# Patient Record
Sex: Female | Born: 1945
Health system: Southern US, Community
[De-identification: ages and names within clinical notes are randomized; demographics above are authoritative.]

## PROBLEM LIST (undated history)

## (undated) DIAGNOSIS — I10 Essential (primary) hypertension: Secondary | ICD-10-CM

## (undated) DIAGNOSIS — I639 Cerebral infarction, unspecified: Secondary | ICD-10-CM

## (undated) DIAGNOSIS — I4891 Unspecified atrial fibrillation: Secondary | ICD-10-CM

## (undated) HISTORY — DX: Cerebral infarction, unspecified: I63.9

---

## 1998-08-01 ENCOUNTER — Emergency Department (HOSPITAL_COMMUNITY): Admission: EM | Admit: 1998-08-01 | Discharge: 1998-08-01 | Payer: Self-pay | Admitting: Emergency Medicine

## 1998-08-16 ENCOUNTER — Encounter: Admission: RE | Admit: 1998-08-16 | Discharge: 1998-08-16 | Payer: Self-pay | Admitting: *Deleted

## 1998-08-30 ENCOUNTER — Encounter: Admission: RE | Admit: 1998-08-30 | Discharge: 1998-08-30 | Payer: Self-pay | Admitting: Family Medicine

## 1998-12-02 ENCOUNTER — Encounter: Admission: RE | Admit: 1998-12-02 | Discharge: 1998-12-02 | Payer: Self-pay | Admitting: Family Medicine

## 1998-12-05 ENCOUNTER — Encounter: Admission: RE | Admit: 1998-12-05 | Discharge: 1998-12-05 | Payer: Self-pay | Admitting: Family Medicine

## 1999-03-07 ENCOUNTER — Encounter: Admission: RE | Admit: 1999-03-07 | Discharge: 1999-03-07 | Payer: Self-pay | Admitting: Family Medicine

## 1999-09-22 ENCOUNTER — Encounter: Admission: RE | Admit: 1999-09-22 | Discharge: 1999-09-22 | Payer: Self-pay | Admitting: Family Medicine

## 2000-01-25 ENCOUNTER — Emergency Department (HOSPITAL_COMMUNITY): Admission: EM | Admit: 2000-01-25 | Discharge: 2000-01-25 | Payer: Self-pay | Admitting: Emergency Medicine

## 2000-01-25 ENCOUNTER — Encounter: Payer: Self-pay | Admitting: Emergency Medicine

## 2000-01-30 ENCOUNTER — Encounter: Admission: RE | Admit: 2000-01-30 | Discharge: 2000-01-30 | Payer: Self-pay | Admitting: Family Medicine

## 2000-02-04 ENCOUNTER — Encounter: Admission: RE | Admit: 2000-02-04 | Discharge: 2000-02-04 | Payer: Self-pay | Admitting: Family Medicine

## 2000-03-03 ENCOUNTER — Encounter: Admission: RE | Admit: 2000-03-03 | Discharge: 2000-03-03 | Payer: Self-pay | Admitting: Family Medicine

## 2000-03-10 ENCOUNTER — Encounter: Payer: Self-pay | Admitting: *Deleted

## 2000-03-10 ENCOUNTER — Encounter: Admission: RE | Admit: 2000-03-10 | Discharge: 2000-03-10 | Payer: Self-pay | Admitting: *Deleted

## 2000-03-18 ENCOUNTER — Encounter: Payer: Self-pay | Admitting: *Deleted

## 2000-03-18 ENCOUNTER — Encounter: Admission: RE | Admit: 2000-03-18 | Discharge: 2000-03-18 | Payer: Self-pay | Admitting: *Deleted

## 2001-08-28 ENCOUNTER — Emergency Department (HOSPITAL_COMMUNITY): Admission: EM | Admit: 2001-08-28 | Discharge: 2001-08-28 | Payer: Self-pay | Admitting: Emergency Medicine

## 2001-08-29 ENCOUNTER — Emergency Department (HOSPITAL_COMMUNITY): Admission: EM | Admit: 2001-08-29 | Discharge: 2001-08-29 | Payer: Self-pay | Admitting: *Deleted

## 2001-08-31 ENCOUNTER — Encounter: Admission: RE | Admit: 2001-08-31 | Discharge: 2001-08-31 | Payer: Self-pay | Admitting: Family Medicine

## 2001-08-31 ENCOUNTER — Ambulatory Visit (HOSPITAL_COMMUNITY): Admission: RE | Admit: 2001-08-31 | Discharge: 2001-08-31 | Payer: Self-pay | Admitting: Family Medicine

## 2002-04-03 ENCOUNTER — Encounter: Admission: RE | Admit: 2002-04-03 | Discharge: 2002-04-03 | Payer: Self-pay | Admitting: Family Medicine

## 2002-05-02 ENCOUNTER — Encounter: Admission: RE | Admit: 2002-05-02 | Discharge: 2002-05-02 | Payer: Self-pay | Admitting: Family Medicine

## 2002-05-18 ENCOUNTER — Encounter: Admission: RE | Admit: 2002-05-18 | Discharge: 2002-05-18 | Payer: Self-pay | Admitting: Family Medicine

## 2002-07-12 ENCOUNTER — Encounter: Admission: RE | Admit: 2002-07-12 | Discharge: 2002-07-12 | Payer: Self-pay | Admitting: Family Medicine

## 2002-08-18 ENCOUNTER — Encounter: Admission: RE | Admit: 2002-08-18 | Discharge: 2002-08-18 | Payer: Self-pay | Admitting: Family Medicine

## 2002-09-08 ENCOUNTER — Encounter: Admission: RE | Admit: 2002-09-08 | Discharge: 2002-09-08 | Payer: Self-pay | Admitting: Family Medicine

## 2002-09-22 ENCOUNTER — Encounter: Admission: RE | Admit: 2002-09-22 | Discharge: 2002-09-22 | Payer: Self-pay | Admitting: Sports Medicine

## 2002-10-11 ENCOUNTER — Encounter: Payer: Self-pay | Admitting: Sports Medicine

## 2002-10-11 ENCOUNTER — Encounter: Admission: RE | Admit: 2002-10-11 | Discharge: 2002-10-11 | Payer: Self-pay | Admitting: Sports Medicine

## 2002-10-17 ENCOUNTER — Encounter: Admission: RE | Admit: 2002-10-17 | Discharge: 2002-10-17 | Payer: Self-pay | Admitting: Family Medicine

## 2002-11-16 ENCOUNTER — Encounter: Admission: RE | Admit: 2002-11-16 | Discharge: 2002-11-16 | Payer: Self-pay | Admitting: Sports Medicine

## 2002-12-14 ENCOUNTER — Encounter: Admission: RE | Admit: 2002-12-14 | Discharge: 2002-12-14 | Payer: Self-pay | Admitting: Family Medicine

## 2003-01-10 ENCOUNTER — Encounter: Admission: RE | Admit: 2003-01-10 | Discharge: 2003-01-10 | Payer: Self-pay | Admitting: Family Medicine

## 2003-02-15 ENCOUNTER — Encounter: Admission: RE | Admit: 2003-02-15 | Discharge: 2003-02-15 | Payer: Self-pay | Admitting: Sports Medicine

## 2003-03-20 ENCOUNTER — Encounter: Admission: RE | Admit: 2003-03-20 | Discharge: 2003-03-20 | Payer: Self-pay | Admitting: Family Medicine

## 2003-04-13 ENCOUNTER — Ambulatory Visit (HOSPITAL_COMMUNITY): Admission: RE | Admit: 2003-04-13 | Discharge: 2003-04-13 | Payer: Self-pay | Admitting: *Deleted

## 2003-04-27 ENCOUNTER — Encounter: Admission: RE | Admit: 2003-04-27 | Discharge: 2003-04-27 | Payer: Self-pay | Admitting: Family Medicine

## 2003-04-27 ENCOUNTER — Encounter (INDEPENDENT_AMBULATORY_CARE_PROVIDER_SITE_OTHER): Payer: Self-pay | Admitting: *Deleted

## 2003-05-18 ENCOUNTER — Encounter: Admission: RE | Admit: 2003-05-18 | Discharge: 2003-05-18 | Payer: Self-pay | Admitting: Family Medicine

## 2003-09-04 ENCOUNTER — Encounter: Admission: RE | Admit: 2003-09-04 | Discharge: 2003-09-04 | Payer: Self-pay | Admitting: Family Medicine

## 2003-09-04 ENCOUNTER — Encounter (INDEPENDENT_AMBULATORY_CARE_PROVIDER_SITE_OTHER): Payer: Self-pay | Admitting: *Deleted

## 2003-09-19 ENCOUNTER — Ambulatory Visit: Payer: Self-pay | Admitting: Family Medicine

## 2003-09-26 ENCOUNTER — Encounter: Admission: RE | Admit: 2003-09-26 | Discharge: 2003-09-26 | Payer: Self-pay | Admitting: Sports Medicine

## 2003-09-26 ENCOUNTER — Ambulatory Visit: Payer: Self-pay | Admitting: Family Medicine

## 2003-12-05 ENCOUNTER — Ambulatory Visit: Payer: Self-pay | Admitting: Family Medicine

## 2004-02-21 ENCOUNTER — Emergency Department (HOSPITAL_COMMUNITY): Admission: EM | Admit: 2004-02-21 | Discharge: 2004-02-22 | Payer: Self-pay | Admitting: Emergency Medicine

## 2004-03-18 ENCOUNTER — Ambulatory Visit: Payer: Self-pay | Admitting: Family Medicine

## 2004-03-21 ENCOUNTER — Ambulatory Visit: Payer: Self-pay | Admitting: Family Medicine

## 2004-04-03 ENCOUNTER — Encounter: Admission: RE | Admit: 2004-04-03 | Discharge: 2004-04-03 | Payer: Self-pay | Admitting: Sports Medicine

## 2004-04-04 ENCOUNTER — Ambulatory Visit: Payer: Self-pay | Admitting: Family Medicine

## 2004-07-11 ENCOUNTER — Ambulatory Visit: Payer: Self-pay | Admitting: Family Medicine

## 2004-07-25 ENCOUNTER — Encounter (INDEPENDENT_AMBULATORY_CARE_PROVIDER_SITE_OTHER): Payer: Self-pay | Admitting: Specialist

## 2004-07-25 ENCOUNTER — Inpatient Hospital Stay (HOSPITAL_COMMUNITY): Admission: EM | Admit: 2004-07-25 | Discharge: 2004-07-26 | Payer: Self-pay | Admitting: Emergency Medicine

## 2004-07-31 ENCOUNTER — Ambulatory Visit: Payer: Self-pay | Admitting: Family Medicine

## 2004-08-26 ENCOUNTER — Emergency Department (HOSPITAL_COMMUNITY): Admission: EM | Admit: 2004-08-26 | Discharge: 2004-08-26 | Payer: Self-pay | Admitting: Emergency Medicine

## 2005-01-20 ENCOUNTER — Ambulatory Visit: Payer: Self-pay | Admitting: Family Medicine

## 2005-01-21 ENCOUNTER — Ambulatory Visit: Payer: Self-pay | Admitting: Family Medicine

## 2005-02-24 ENCOUNTER — Ambulatory Visit: Payer: Self-pay | Admitting: Sports Medicine

## 2005-02-24 ENCOUNTER — Encounter (INDEPENDENT_AMBULATORY_CARE_PROVIDER_SITE_OTHER): Payer: Self-pay | Admitting: *Deleted

## 2005-05-01 ENCOUNTER — Ambulatory Visit: Payer: Self-pay | Admitting: Family Medicine

## 2005-05-05 ENCOUNTER — Ambulatory Visit (HOSPITAL_COMMUNITY): Admission: RE | Admit: 2005-05-05 | Discharge: 2005-05-05 | Payer: Self-pay | Admitting: Family Medicine

## 2005-05-08 ENCOUNTER — Encounter: Admission: RE | Admit: 2005-05-08 | Discharge: 2005-05-08 | Payer: Self-pay | Admitting: Sports Medicine

## 2005-06-09 ENCOUNTER — Ambulatory Visit: Payer: Self-pay | Admitting: Family Medicine

## 2005-06-23 ENCOUNTER — Ambulatory Visit: Payer: Self-pay | Admitting: Sports Medicine

## 2005-10-30 ENCOUNTER — Ambulatory Visit: Payer: Self-pay | Admitting: Family Medicine

## 2005-11-09 ENCOUNTER — Ambulatory Visit: Payer: Self-pay | Admitting: Family Medicine

## 2005-12-05 ENCOUNTER — Encounter (INDEPENDENT_AMBULATORY_CARE_PROVIDER_SITE_OTHER): Payer: Self-pay | Admitting: *Deleted

## 2005-12-05 LAB — CONVERTED CEMR LAB

## 2005-12-10 ENCOUNTER — Encounter: Admission: RE | Admit: 2005-12-10 | Discharge: 2005-12-10 | Payer: Self-pay | Admitting: Sports Medicine

## 2005-12-10 ENCOUNTER — Ambulatory Visit: Payer: Self-pay | Admitting: Sports Medicine

## 2005-12-16 ENCOUNTER — Ambulatory Visit: Payer: Self-pay | Admitting: Sports Medicine

## 2005-12-22 ENCOUNTER — Ambulatory Visit: Payer: Self-pay

## 2005-12-22 ENCOUNTER — Encounter (INDEPENDENT_AMBULATORY_CARE_PROVIDER_SITE_OTHER): Payer: Self-pay | Admitting: *Deleted

## 2006-01-08 ENCOUNTER — Ambulatory Visit: Payer: Self-pay

## 2006-02-01 ENCOUNTER — Emergency Department (HOSPITAL_COMMUNITY): Admission: EM | Admit: 2006-02-01 | Discharge: 2006-02-01 | Payer: Self-pay | Admitting: Family Medicine

## 2006-02-16 ENCOUNTER — Ambulatory Visit: Payer: Self-pay | Admitting: Family Medicine

## 2006-03-04 DIAGNOSIS — E782 Mixed hyperlipidemia: Secondary | ICD-10-CM

## 2006-03-04 DIAGNOSIS — R8789 Other abnormal findings in specimens from female genital organs: Secondary | ICD-10-CM

## 2006-03-04 DIAGNOSIS — I10 Essential (primary) hypertension: Secondary | ICD-10-CM | POA: Insufficient documentation

## 2006-03-05 ENCOUNTER — Encounter (INDEPENDENT_AMBULATORY_CARE_PROVIDER_SITE_OTHER): Payer: Self-pay | Admitting: *Deleted

## 2006-05-21 ENCOUNTER — Telehealth: Payer: Self-pay | Admitting: *Deleted

## 2006-09-07 ENCOUNTER — Emergency Department (HOSPITAL_COMMUNITY): Admission: EM | Admit: 2006-09-07 | Discharge: 2006-09-07 | Payer: Self-pay | Admitting: *Deleted

## 2007-12-06 ENCOUNTER — Inpatient Hospital Stay (HOSPITAL_COMMUNITY): Admission: EM | Admit: 2007-12-06 | Discharge: 2007-12-08 | Payer: Self-pay | Admitting: Emergency Medicine

## 2010-01-13 ENCOUNTER — Emergency Department (HOSPITAL_COMMUNITY)
Admission: EM | Admit: 2010-01-13 | Discharge: 2010-01-13 | Payer: Self-pay | Source: Home / Self Care | Admitting: Emergency Medicine
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
## 2010-05-20 NOTE — Discharge Summary (Signed)
Sherry Fitzgerald, Sherry Fitzgerald                  ACCOUNT NO.:  [id number]   MEDICAL RECORD NO.:  [id number]          PATIENT TYPE:  INP   LOCATION:  1338                         FACILITY:  [facility]   PHYSICIAN:  Monte Fantasia, MD  DATE OF BIRTH:  1945/06/02   DATE OF ADMISSION:  12/06/2007  DATE OF DISCHARGE:                               DISCHARGE SUMMARY   DISCHARGE DIAGNOSES:  1. Gastroenteritis, possibly viral/bacterial.  2. Hypertension, blood pressure uncontrolled on admission.  3. Elevated cholesterol.   DISCHARGE MEDICATIONS:  1. Ciprofloxacin 500 mg p.o. q.12 hours for five days.  2. Hydrochlorothiazide 25 mg p.o. daily.  3. Norvasc 5 mg p.o. daily.  4. Protonix 40 mg p.o. q.12 hours.   COURSE DURING THE HOSPITAL STAY:  Sherry Fitzgerald is a 65 year old lady  patient of Oriental descent, was admitted on December 1 with complaints  of nausea, vomiting and diarrhea.  She had come in with complaints of  right upper quadrant pain when she came in on Sunday.  She received  Reglan and Zofran in the ER with IV fluid hydration.  CT abdomen and  pelvis done, revealed no evidence of any acute pancreatitis or surgical  abdomen.  Patient was kept n.p.o. and was then gradually increased to  clear liquids to soft then full diet which she tolerated very well.  Patient episodes of diarrhea resolved gradually and at present the  patient has 2 - 3 episodes of well formed stools and patient also during  the hospital stay liver functions were found to be mildly elevated, now  at present are on a downward trend.  Patient was started on  ciprofloxacin for her possible gastroenteritis plus for possible  bacterial origin of her gastroenteritis.  We will treat her for  ciprofloxacin for 5 days and patient symptomatically feeling much better  and can be discharged, if stable enough will be discharged home.   Radiological investigations during the hospital stay:  1. CT abdomen and pelvis done on December 06, 2007.   Impression:  Prior      cholecystectomy, dilated common bile duct, likely related to the      post cholecystectomy state.  2. Ultrasound of the abdomen done on December 07, 2007.  Impression:      Prior cholecystectomy, dilated common bile duct related to the post      cholecystectomy state.   LABS ON DISCHARGE:  Total WBCs 2.9, hemoglobin 11.7, hematocrit 36.9,  platelet count 196, sodium 140, potassium 3.9, chloride 112, bicarb 23,  glucose 112, BUN 2, creatinine 0.6, total bilirubin 8.8, alkaline  phosphatase 132 which is decreased from 163, AST is 86 decreased from  316, ALT is 180 decreased from 276, total protein 6, albumin 3.1,  calcium 8.4, magnesium 1.9.   PLAN:  To discharge patient home.  Will start on ciprofloxacin oral for  5 days.  Also patient is started on Norvasc 5 mg for better blood  pressure control, hydrochlorothiazide continued at 25 mg daily.  Protonix to be continued for the next 2 weeks.  Patient advised to  follow up with the  primary care physician in the next one week.   PRIMARY CARE PHYSICIAN:  Unassigned.      Monte Fantasia, MD  Electronically Signed     MP/MEDQ  D:  12/08/2007  T:  12/08/2007  Job:  [id number]

## 2010-05-23 NOTE — H&P (Signed)
NAMECIEANNA, STORMES NO.:  [id number]   MEDICAL RECORD NO.:  [id number]          PATIENT TYPE:  EMS   LOCATION:  MAJO                         FACILITY:  [facility]   PHYSICIAN:  Paula Libra, MD        DATE OF BIRTH:  Feb 15, 1945   DATE OF ADMISSION:  07/25/2004  DATE OF DISCHARGE:                                HISTORY & PHYSICAL   CHIEF COMPLAINT:  Right upper quadrant pain.   Ms. Herold Harms is a 65 year old female who awoke this morning with right upper  quadrant pain radiating into her scapular region.  This was accompanied by  fever and chills.  She denied any chest pain, shortness of breath and  otherwise her review of systems are negative.  An ultrasound at [facility] showed gallbladder thickening with pericholecystic fluid as well as  sludge.  This study is consistent with acute cholecystitis.   ALLERGIES:  No known drug allergies.   MEDICATIONS:  None.   PAST MEDICAL HISTORY:  1.  Hypertension.  2.  Hypercholesterolemia.   PAST SURGICAL HISTORY:  Cesarean section in the 1990s.   SOCIAL HISTORY:  No tobacco, alcohol, or illicit drug use.  She lives in  [place].   FAMILY HISTORY:  Mom alive age 35, hypertension.  Dad deceased age 75, MI.   PHYSICAL EXAMINATION:  VITAL SIGNS:  Temperature 103, pulse 92, respirations  20, blood pressure 138/89.  GENERAL:  She appears ill.  Complains of right upper quadrant and is  extremely hot to touch.  HEENT:  Grossly normal.  No carotid or subclavian bruits.  No JVD or  thyromegaly.  Sclerae clear.  Conjunctivae normal.  Nares without discharge.  HEART:  Regular rate and rhythm with a soft 1/6 murmur.  No diastolic  component.  CHEST:  Clear to auscultation bilaterally.  ADENOPATHY:  None.  ABDOMEN:  Right upper quadrant tenderness.  Positive Murphy's sign.  EXTREMITIES:  No peripheral edema.  SKIN:  Hot, dry to touch.   LABORATORIES:  White count 11,000.  AST 198, ALT 92, total bilirubin 0.4.  Lipase is  normal.  Urinalysis is negative.   ASSESSMENT/PLAN:  1.  Acute cholecystitis.  She will be taken to the operating room emergently      by Dr. Wenda Low.  2.  Hypertension.  3.  Hypercholesterolemia.  4.  History of cesarean section in the 1990s.  The patient was seen and      examined by Dr. Wenda Low.        LB/MEDQ  D:  07/25/2004  T:  07/25/2004  Job:  [id number]   cc:   [facility]   Thornton Park. Daphine Deutscher, MD  [address], [address]  [place]  [place] [address]

## 2010-05-23 NOTE — Op Note (Signed)
Sherry Fitzgerald, Sherry Fitzgerald                  ACCOUNT NO.:  [id number]   MEDICAL RECORD NO.:  [id number]          PATIENT TYPE:  INP   LOCATION:  5735                         FACILITY:  [facility]   PHYSICIAN:  Thornton Park. Daphine Deutscher, MD  DATE OF BIRTH:  04/11/1945   DATE OF PROCEDURE:  07/25/2004  DATE OF DISCHARGE:                                 OPERATIVE REPORT   PREOPERATIVE DIAGNOSIS:  Acute cholecystitis.   POSTOPERATIVE DIAGNOSIS:  Acute cholecystitis.   PROCEDURE:  Laparoscopic cholecystectomy with intraoperative cholangiogram.   SURGEON:  Thornton Park. Daphine Deutscher, MD   ANESTHESIA:  General endotracheal.   ASSISTANT:  Magnus Ivan, RN   DESCRIPTION OF PROCEDURE:  The patient was taken to room 17 on Friday, July 25, 2004 and given general anesthesia. Abdomen was prepped widely with  Betadine and draped sterilely. A longitudinal incision was made down the  umbilicus through which the Hasson cannula was inserted without difficulty.  The abdomen was insufflated.  The gallbladder was grasped and elevated, the  adhesions were pulled away. Dissected free of Calot's triangle and found a  fairly short cystic duct.  I put a clip upon the gallbladder, incised the  cystic duct, it had a lot of mucus in it. I inserted a Reddick catheter,  took a dynamic cholangiogram which showed a rather plump the common bile  duct with distal tapering but no obvious stones. Intrahepatic filling was  noted as well. The contrast flowed freely into the duodenum. Cystic duct was  then triple clipped, divided.  Cystic artery was triple clipped divided and  the gallbladder was removed from the gallbladder bed without entering it.  One little spot at the very top was bleeding and was cauterized  electrocautery. The gallbladder was placed in the bag brought out through  the umbilicus. The umbilical port was repaired with figure-of-eight suture 0  Vicryl under laparoscopic vision. The abdomen was reinspected. No bleeding  or bile  leaks were noted. Once deflated, the wounds were closed 4-0 Vicryl,  Benzoin Steri-Strips. The patient tolerated procedure well and was taken to  recovery room in satisfactory condition.       MBM/MEDQ  D:  07/25/2004  T:  07/26/2004  Job:  [id number]

## 2010-10-10 LAB — DIFFERENTIAL
Basophils Absolute: 0 10*3/uL (ref 0.0–0.1)
Basophils Relative: 0 % (ref 0–1)
Eosinophils Relative: 1 % (ref 0–5)
Lymphocytes Relative: 6 % — ABNORMAL LOW (ref 12–46)
Monocytes Absolute: 0.4 10*3/uL (ref 0.1–1.0)
Monocytes Relative: 4 % (ref 3–12)

## 2010-10-10 LAB — COMPREHENSIVE METABOLIC PANEL
ALT: 180 U/L — ABNORMAL HIGH (ref 0–35)
ALT: 276 U/L — ABNORMAL HIGH (ref 0–35)
AST: 114 U/L — ABNORMAL HIGH (ref 0–37)
AST: 316 U/L — ABNORMAL HIGH (ref 0–37)
AST: 86 U/L — ABNORMAL HIGH (ref 0–37)
Albumin: 2.9 g/dL — ABNORMAL LOW (ref 3.5–5.2)
Albumin: 3.4 g/dL — ABNORMAL LOW (ref 3.5–5.2)
Alkaline Phosphatase: 132 U/L — ABNORMAL HIGH (ref 39–117)
Alkaline Phosphatase: 163 U/L — ABNORMAL HIGH (ref 39–117)
Alkaline Phosphatase: 82 U/L (ref 39–117)
BUN: 7 mg/dL (ref 6–23)
CO2: 23 mEq/L (ref 19–32)
CO2: 24 mEq/L (ref 19–32)
Calcium: 8 mg/dL — ABNORMAL LOW (ref 8.4–10.5)
Chloride: 106 mEq/L (ref 96–112)
Chloride: 107 mEq/L (ref 96–112)
Chloride: 112 mEq/L (ref 96–112)
Creatinine, Ser: 0.67 mg/dL (ref 0.4–1.2)
GFR calc Af Amer: >60 mL/min (ref >60)
GFR calc Af Amer: >60 mL/min (ref >60)
GFR calc Af Amer: >60 mL/min (ref >60)
GFR calc non Af Amer: >60 mL/min (ref >60)
GFR calc non Af Amer: >60 mL/min (ref >60)
Glucose, Bld: 112 mg/dL — ABNORMAL HIGH (ref 70–99)
Glucose, Bld: 93 mg/dL (ref 70–99)
Potassium: 3.3 mEq/L — ABNORMAL LOW (ref 3.5–5.1)
Potassium: 4.6 mEq/L (ref 3.5–5.1)
Sodium: 137 mEq/L (ref 135–145)
Sodium: 140 mEq/L (ref 135–145)
Total Bilirubin: 0.8 mg/dL (ref 0.3–1.2)
Total Bilirubin: 0.9 mg/dL (ref 0.3–1.2)
Total Bilirubin: 1.2 mg/dL (ref 0.3–1.2)
Total Protein: 5.9 g/dL — ABNORMAL LOW (ref 6.0–8.3)
Total Protein: 6.6 g/dL (ref 6.0–8.3)

## 2010-10-10 LAB — CBC
Hemoglobin: 11.6 g/dL — ABNORMAL LOW (ref 12.0–15.0)
Hemoglobin: 11.7 g/dL — ABNORMAL LOW (ref 12.0–15.0)
MCHC: 32.2 g/dL (ref 30.0–36.0)
MCV: 71.7 fL — ABNORMAL LOW (ref 78.0–100.0)
Platelets: 218 10*3/uL (ref 150–400)
RBC: 5 MIL/uL (ref 3.87–5.11)
RBC: 5.14 MIL/uL — ABNORMAL HIGH (ref 3.87–5.11)
RDW: 13.7 % (ref 11.5–15.5)
RDW: 14.3 % (ref 11.5–15.5)
WBC: 10.5 10*3/uL (ref 4.0–10.5)
WBC: 2.9 10*3/uL — ABNORMAL LOW (ref 4.0–10.5)

## 2010-10-10 LAB — MAGNESIUM: Magnesium: 1.9 mg/dL (ref 1.5–2.5)

## 2010-10-10 LAB — LIPID PANEL
Cholesterol: 218 mg/dL — ABNORMAL HIGH (ref 0–200)
HDL: 47 mg/dL (ref >39)
LDL Cholesterol: 161 mg/dL — ABNORMAL HIGH (ref 0–99)
Total CHOL/HDL Ratio: 4.6 RATIO
Triglycerides: 50 mg/dL (ref <150)
VLDL: 10 mg/dL (ref 0–40)

## 2010-10-10 LAB — HEPATITIS A ANTIBODY, IGM: Hep A IgM: NEGATIVE

## 2010-10-10 LAB — LIPASE, BLOOD: Lipase: 125 U/L — ABNORMAL HIGH (ref 11–59)

## 2010-10-10 LAB — HEPATITIS C ANTIBODY: HCV Ab: NEGATIVE

## 2010-10-10 LAB — HEPATITIS B CORE ANTIBODY, IGM: Hep B C IgM: NEGATIVE

## 2010-10-17 LAB — I-STAT 8, (EC8 V) (CONVERTED LAB)
Acid-base deficit: 2
HCT: 54 — ABNORMAL HIGH
Operator id: 272551
Potassium: 7.6
TCO2: 28
pH, Ven: 7.291

## 2010-10-17 LAB — CBC
Hemoglobin: 14.8
MCHC: 32.5
Platelets: 377
RDW: 13.9

## 2010-10-17 LAB — DIFFERENTIAL
Basophils Absolute: 0
Basophils Relative: 0
Monocytes Absolute: 0.7
Neutro Abs: 16.7 — ABNORMAL HIGH
Neutrophils Relative %: 90 — ABNORMAL HIGH

## 2010-10-17 LAB — POTASSIUM: Potassium: 4.5

## 2010-10-27 ENCOUNTER — Encounter: Payer: Self-pay | Admitting: Family Medicine

## 2010-10-27 ENCOUNTER — Ambulatory Visit (INDEPENDENT_AMBULATORY_CARE_PROVIDER_SITE_OTHER): Payer: Medicaid Other | Admitting: Family Medicine

## 2010-10-27 DIAGNOSIS — E785 Hyperlipidemia, unspecified: Secondary | ICD-10-CM

## 2010-10-27 DIAGNOSIS — M79605 Pain in left leg: Secondary | ICD-10-CM

## 2010-10-27 DIAGNOSIS — M79609 Pain in unspecified limb: Secondary | ICD-10-CM

## 2010-10-27 DIAGNOSIS — M5416 Radiculopathy, lumbar region: Secondary | ICD-10-CM | POA: Insufficient documentation

## 2010-10-27 DIAGNOSIS — I10 Essential (primary) hypertension: Secondary | ICD-10-CM

## 2010-10-27 DIAGNOSIS — M722 Plantar fascial fibromatosis: Secondary | ICD-10-CM

## 2010-10-27 LAB — COMPREHENSIVE METABOLIC PANEL
ALT: 23 U/L (ref 0–35)
CO2: 26 mEq/L (ref 19–32)
Calcium: 9.3 mg/dL (ref 8.4–10.5)
Chloride: 101 mEq/L (ref 96–112)
Creat: 1.22 mg/dL — ABNORMAL HIGH (ref 0.50–1.10)
Sodium: 141 mEq/L (ref 135–145)
Total Protein: 7.6 g/dL (ref 6.0–8.3)

## 2010-10-27 MED ORDER — HYDROCHLOROTHIAZIDE 12.5 MG PO TABS: 12.5000 mg | ORAL_TABLET | Freq: Every day | ORAL | Status: DC

## 2010-10-27 MED ORDER — GABAPENTIN 300 MG PO CAPS: 300.0000 mg | ORAL_CAPSULE | Freq: Every day | ORAL | Status: DC

## 2010-10-28 LAB — CBC
Platelets: 269 10*3/uL (ref 150–400)
RDW: 14.4 % (ref 11.5–15.5)
WBC: 7.2 10*3/uL (ref 4.0–10.5)

## 2010-11-02 NOTE — Assessment & Plan Note (Signed)
BP elevated today.  I think she needs to get back on a medication for this.  Discussed options and will start HCTZ, return in two weeks to recheck.

## 2010-11-02 NOTE — Progress Notes (Signed)
  Subjective:    Patient ID: Sherry Fitzgerald, female    DOB: 1945-04-15, 65 y.o.   MRN: [id number]  HPI 1. HTN:  Has history of HTN and was on medication previously.  She is unsure what she was on before but does recall it was only a single medicine.  She has not taken medication in >2 years.  Has not had lab work for quite sometime either.  She denies any side effects from previous meds.  She denies any headaches, nausea, vision change, chest pain, shortness of breath.   2. Pain in leg:  Has pain in legs L>R. Describes pain as aching and tingling with occasional numbness down the legs.  No weakness or foot drop noticed.  Noticed a few weeks ago, no recent injury.  Denies low back pain.  Denies problems with bowel or bladder and no inner thigh or perineal numbness.   3. Pain in foot:  Pain in R foot for a couple weeks.  Pain is described as burning and located along the plantar aspect of the foot.  Pain is worse when first getting up and improves as the day goes on.  Has not tried anything to alleviated the pain.  No swelling or redness in the foot.   4. HLD:  History of HLD, currently on no medication for this.  Unsure if she was on medication for this in the past.  Eats a low fat mostly vegetable and grain diet.  She denies history of diabetes.    Review of Systems     Objective:   Physical Exam  Constitutional: She appears well-developed and well-nourished. No distress.  Neck: Neck supple. No thyromegaly present.  Cardiovascular: Normal rate, regular rhythm and normal heart sounds.  Exam reveals no gallop and no friction rub.   No murmur heard. Pulmonary/Chest: Effort normal and breath sounds normal. No respiratory distress. She has no wheezes.  Musculoskeletal:       Lower back without TTP.  SLR + on L for tingling down L leg, Negative on R.  R foot is tender to palpation on calcaneal tuberosity along insertion of plantar fascia.            Assessment & Plan:

## 2010-11-02 NOTE — Assessment & Plan Note (Signed)
History and exam consistent with plantar fasciitis of R foot.  Given exercises and stretches to try.  Instructed to ice in ice water daily.  F/u in 6 weeks to see if improving.

## 2010-11-02 NOTE — Assessment & Plan Note (Signed)
Left leg pain consistent with radicular or neuropathic pain.  Will give her a trial of neurontin, will start at night for now to minimize side effects and adjust as needed to provide relief of symptoms.

## 2010-11-02 NOTE — Assessment & Plan Note (Signed)
Check D-LDL today 

## 2010-11-12 ENCOUNTER — Encounter: Payer: Self-pay | Admitting: Family Medicine

## 2010-11-12 ENCOUNTER — Ambulatory Visit (INDEPENDENT_AMBULATORY_CARE_PROVIDER_SITE_OTHER): Payer: Medicaid Other | Admitting: Family Medicine

## 2010-11-12 DIAGNOSIS — M5412 Radiculopathy, cervical region: Secondary | ICD-10-CM | POA: Insufficient documentation

## 2010-11-12 DIAGNOSIS — I10 Essential (primary) hypertension: Secondary | ICD-10-CM

## 2010-11-12 DIAGNOSIS — M25519 Pain in unspecified shoulder: Secondary | ICD-10-CM

## 2010-11-12 DIAGNOSIS — M25511 Pain in right shoulder: Secondary | ICD-10-CM

## 2010-11-12 DIAGNOSIS — E785 Hyperlipidemia, unspecified: Secondary | ICD-10-CM

## 2010-11-12 LAB — BASIC METABOLIC PANEL
Potassium: 4.3 mEq/L (ref 3.5–5.3)
Sodium: 139 mEq/L (ref 135–145)

## 2010-11-12 MED ORDER — HYDROCHLOROTHIAZIDE 25 MG PO TABS: 25.0000 mg | ORAL_TABLET | Freq: Every day | ORAL | Status: DC

## 2010-11-12 MED ORDER — SIMVASTATIN 20 MG PO TABS: 20.0000 mg | ORAL_TABLET | Freq: Every evening | ORAL | Status: DC

## 2010-11-12 MED ORDER — PREDNISONE (PAK) 10 MG PO TABS: 10.0000 mg | ORAL_TABLET | Freq: Every day | ORAL | Status: AC

## 2010-11-12 NOTE — Patient Instructions (Signed)
It was nice seeing you today I have sent over medications to help with your shoulder I would like to increase your blood pressure medication (hydrochlorothiazide) from 12.5mg  to 25mg . I have also sent over a medication for your cholesterol Please make an appointment to follow up with me in 2 weeks for the shoulder and blood pressure. Call with any questions.

## 2010-11-23 NOTE — Assessment & Plan Note (Addendum)
BP elevated, will increase HCTZ to full dose.  Repeat BMET today as Cr was elevated on last check.  F/u again in 2 weeks.

## 2010-11-23 NOTE — Assessment & Plan Note (Signed)
Pain sounds like radiculopathy from neck.  Also with mildly positive spurlings.  Will try her on steroid taper to see if this improves her symptoms. F/U in 2 weeks.  If not improving, consider increase in neurontin.

## 2010-11-23 NOTE — Progress Notes (Signed)
  Subjective:    Patient ID: Sherry Fitzgerald, female    DOB: 1945-03-05, 65 y.o.   MRN: [id number]  HPI 1. Pain in R arm:   Complains of pain in her R arm.  Pain in her arm has been present for many weeks. During previous visit had complaint of pain in her legs but forgot to mention arm because it was not bothering her that day.  Pain comes and goes and is described as "shooting" with numbness and tingling.  She also has some pain in her neck on that side.  Recently started on Neurontin for her leg pain which helps some.  She denies any difficulty with grip, chest pain, nausea, pain in the L arm.     2. HLD:  Recent labs revealed elevated d-LDL.  She states she eats a low fat diet, low in meats. Unsure if she has been on medication for cholesterol in the past.  Is willing to give medicaiton a try.  3. HTN:  Started on HCTZ 12.5 mg at previous appointment, here to recheck on medication.  Tolerating well, compliant with use.  BP still high today. Denies chest pain, headache, palpitations, shortness of breath.  Cr elevated at last appointment, denies any NSAID use, recent nausea, vomiting.    Review of Systems    Per HPI Objective:   Physical Exam  Constitutional: She appears well-developed and well-nourished. No distress.  HENT:  Head: Normocephalic and atraumatic.  Cardiovascular: Normal rate, regular rhythm, normal heart sounds and intact distal pulses.  Exam reveals no friction rub.   No murmur heard. Pulmonary/Chest: Effort normal and breath sounds normal.  Musculoskeletal: She exhibits no edema.       Neck: Mildly positive spurling's Decreased neck range of motion Grip strength and sensation normal in bilateral hands Strength good C4 to T1 distribution No sensory change to C4 to T1 Reflexes normal  Shoulder: Inspection reveals no abnormalities, atrophy or asymmetry. Palpation is normal with no tenderness over AC joint or bicipital groove. ROM is full in all planes. Rotator cuff  strength normal throughout. No signs of impingement with negative Neer and Hawkin's tests, empty can.  Mild spasm in upper trapezius             Assessment & Plan:

## 2010-11-23 NOTE — Assessment & Plan Note (Signed)
LDL elevated, will start simvastatin and recheck FLP in 6 weeks.

## 2010-11-24 ENCOUNTER — Encounter: Payer: Self-pay | Admitting: Family Medicine

## 2010-11-24 ENCOUNTER — Ambulatory Visit (INDEPENDENT_AMBULATORY_CARE_PROVIDER_SITE_OTHER): Payer: Medicaid Other | Admitting: Family Medicine

## 2010-11-24 VITALS — BP 164/70 | HR 84 | Temp 98.3°F | Ht <= 58 in | Wt 132.6 lb

## 2010-11-24 DIAGNOSIS — M25519 Pain in unspecified shoulder: Secondary | ICD-10-CM

## 2010-11-24 DIAGNOSIS — I10 Essential (primary) hypertension: Secondary | ICD-10-CM

## 2010-11-24 DIAGNOSIS — M25511 Pain in right shoulder: Secondary | ICD-10-CM

## 2010-11-24 MED ORDER — GABAPENTIN 300 MG PO CAPS: 300.0000 mg | ORAL_CAPSULE | Freq: Three times a day (TID) | ORAL | Status: DC

## 2010-11-24 MED ORDER — AMLODIPINE BESYLATE 5 MG PO TABS: 5.0000 mg | ORAL_TABLET | Freq: Every day | ORAL | Status: DC

## 2010-11-24 NOTE — Patient Instructions (Signed)
It was nice seeing you today. I want you to increase your gabapentin to 300mg  three times per day, I have sent a new prescription to your pharmacy I have also sent in a prescription for amlodipine to help with control of your blood pressure I would like to see you back in 1 month to see how you are doing with the new medications.

## 2010-11-30 NOTE — Assessment & Plan Note (Addendum)
?   Cough with ace-I.  Will add on amlodipine and have her return in 1 month for follow up.  No red flags today.

## 2010-11-30 NOTE — Assessment & Plan Note (Signed)
Improved with steroid burst.  Will increase gabapentin to tid since this is working well at night.  Will f/u in one month so assess how she is doing with medication increase.

## 2010-11-30 NOTE — Progress Notes (Signed)
  Subjective:    Patient ID: Sherry Fitzgerald, female    DOB: [birth year], 65 y.o.   MRN: [id number]  HPI 1.R Shoulder pain/arm: Pain still present but much improved since steroid burst.  Decreased pain in arm and neck.  Feels like gabapentin is working well and she would like to go up on this.  She still gets occasional numbness/tingling down her arm but decreased from previous visit.  She denies nausea, chest pain, weight loss.   2. HTN:  Bp still elevated.  Is taking full dose HCTZ.  Thinks she has had cough with ACE-I in the past. She denies headache, shortness of breath, palpitations.     Review of Systems     Objective:   Physical Exam  Constitutional: She appears well-developed and well-nourished. No distress.  Cardiovascular: Normal rate, regular rhythm and normal heart sounds.   Pulmonary/Chest: Effort normal and breath sounds normal. No respiratory distress.  Musculoskeletal: She exhibits no edema.       Neck: Mildly positive spurling's Decreased neck range of motion Grip strength and sensation normal in bilateral hands Strength good C4 to T1 distribution No sensory change to C4 to T1 Reflexes normal  Shoulder: ROM full Spasm in upper trapezium improved from previous           Assessment & Plan:

## 2010-12-22 ENCOUNTER — Encounter: Payer: Self-pay | Admitting: Family Medicine

## 2010-12-22 ENCOUNTER — Ambulatory Visit (INDEPENDENT_AMBULATORY_CARE_PROVIDER_SITE_OTHER): Payer: Medicaid Other | Admitting: Family Medicine

## 2010-12-22 VITALS — BP 152/88 | HR 62 | Ht <= 58 in | Wt 128.9 lb

## 2010-12-22 DIAGNOSIS — M25561 Pain in right knee: Secondary | ICD-10-CM

## 2010-12-22 DIAGNOSIS — I10 Essential (primary) hypertension: Secondary | ICD-10-CM

## 2010-12-22 DIAGNOSIS — M25569 Pain in unspecified knee: Secondary | ICD-10-CM

## 2010-12-22 DIAGNOSIS — M25519 Pain in unspecified shoulder: Secondary | ICD-10-CM

## 2010-12-22 DIAGNOSIS — M25511 Pain in right shoulder: Secondary | ICD-10-CM

## 2010-12-22 MED ORDER — AMLODIPINE BESYLATE 10 MG PO TABS: 10.0000 mg | ORAL_TABLET | Freq: Every day | ORAL | Status: DC

## 2010-12-22 MED ORDER — TRAMADOL HCL 50 MG PO TABS: 50.0000 mg | ORAL_TABLET | Freq: Three times a day (TID) | ORAL | Status: AC | PRN

## 2010-12-22 NOTE — Patient Instructions (Addendum)
It was nice seeing you today. I would like for you to increase your amlodipine to 10mg  I would like for you to continue the gabapentin and try the tramadol for your pain I will see you back in one month to recheck your blood pressure and to see how your pain is doing.

## 2010-12-31 NOTE — Assessment & Plan Note (Signed)
BP not optimally controlled.  Will not add on additional medication at this time.  Will see if better pain control improves BP.

## 2010-12-31 NOTE — Assessment & Plan Note (Signed)
Will add tramadol for additional pain control.  Continue gabapentin. If worsening will consider repeating imaging of C spine and L spine.
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
## 2011-01-21 ENCOUNTER — Encounter: Payer: Self-pay | Admitting: Family Medicine

## 2011-01-21 ENCOUNTER — Ambulatory Visit (INDEPENDENT_AMBULATORY_CARE_PROVIDER_SITE_OTHER): Payer: Medicaid Other | Admitting: Family Medicine

## 2011-01-21 DIAGNOSIS — M79605 Pain in left leg: Secondary | ICD-10-CM

## 2011-01-21 DIAGNOSIS — M25569 Pain in unspecified knee: Secondary | ICD-10-CM

## 2011-01-21 DIAGNOSIS — M25519 Pain in unspecified shoulder: Secondary | ICD-10-CM

## 2011-01-21 DIAGNOSIS — M25562 Pain in left knee: Secondary | ICD-10-CM | POA: Insufficient documentation

## 2011-01-21 DIAGNOSIS — I10 Essential (primary) hypertension: Secondary | ICD-10-CM

## 2011-01-21 DIAGNOSIS — M79609 Pain in unspecified limb: Secondary | ICD-10-CM

## 2011-01-21 DIAGNOSIS — M25561 Pain in right knee: Secondary | ICD-10-CM

## 2011-01-21 DIAGNOSIS — M25511 Pain in right shoulder: Secondary | ICD-10-CM

## 2011-01-21 MED ORDER — LISINOPRIL 10 MG PO TABS: 10.0000 mg | ORAL_TABLET | Freq: Every day | ORAL | Status: DC

## 2011-01-21 NOTE — Assessment & Plan Note (Addendum)
Still not well controlled.  At max doses on two agents. Creatinine on last check ok with baseline 0.6-0.7.  Will start ACE-I, return one week for Cr check.

## 2011-01-21 NOTE — Patient Instructions (Addendum)
It was good to see you today. Continue the neurontin and tramadol for pain I have ordered x-rays of your knees to see if you may have some arthritis there, go to admitting at the hospital to have this done or go to [address] at [facility].  Please come back for a lab visit in 1 week to have your creatinine checked I would like to see you back in 6 months or sooner if needed.   Please call with any questions.

## 2011-01-27 ENCOUNTER — Ambulatory Visit
Admission: RE | Admit: 2011-01-27 | Discharge: 2011-01-27 | Disposition: A | Discharge status: Home / Self Care | Payer: Medicaid Other | Source: Ambulatory Visit | Attending: Family Medicine | Admitting: Family Medicine

## 2011-01-27 DIAGNOSIS — M25561 Pain in right knee: Secondary | ICD-10-CM

## 2011-01-27 NOTE — Progress Notes (Signed)
Addended by: Deno Etienne on: 01/27/2011 10:17 AM   Modules accepted: Orders

## 2011-01-28 ENCOUNTER — Other Ambulatory Visit: Payer: Medicaid Other

## 2011-01-28 ENCOUNTER — Ambulatory Visit: Payer: Medicaid Other | Admitting: Family Medicine

## 2011-01-28 DIAGNOSIS — I10 Essential (primary) hypertension: Secondary | ICD-10-CM

## 2011-01-28 LAB — BASIC METABOLIC PANEL
CO2: 29 mEq/L (ref 19–32)
Glucose, Bld: 109 mg/dL — ABNORMAL HIGH (ref 70–99)
Potassium: 4.4 mEq/L (ref 3.5–5.3)
Sodium: 142 mEq/L (ref 135–145)

## 2011-01-28 NOTE — Progress Notes (Signed)
BMP DONE TODAY Sherry Fitzgerald 

## 2011-02-03 NOTE — Assessment & Plan Note (Signed)
Pain well controlled on tramadol, wanting to return to work.  I think it is  Reasonable for her to return to working, denies sedation on neurontin.  Will get b/l standing knee films to eval for arthritis.

## 2011-02-03 NOTE — Assessment & Plan Note (Signed)
Well controlled with current regimen, has regained FROM.  f/u in 6 month or sooner as needed

## 2011-02-03 NOTE — Progress Notes (Signed)
  Subjective:    Patient ID: Sherry Fitzgerald, female    DOB: Apr 13, 1945, 66 y.o.   MRN: [id number]  HPI 1. F/u Arm and Knee pain:  Pain is doing better with neurontin and tramadol as needed.  States she is ready to go back to work.  States shoulder bothers her at times but ROM is much improved.  Knee pain well controlled.  Pain in R knee>L.  She did have some shooting pain and tingling down L leg at times, this has been well controlled with neurontin.  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 HTN:  BP elevated despite 2 agents at maximum dosage.  She is compliant with medications.  Does not typically check BP at home  She denies chest pain, nausea, headache, weakness.   Review of Systems     Objective:   Physical Exam  Constitutional: She is oriented to person, place, and time. She appears well-nourished. No distress.  HENT:  Head: Normocephalic and atraumatic.  Cardiovascular: Normal rate, regular rhythm and normal heart sounds.  Exam reveals no gallop and no friction rub.   No murmur heard. Pulmonary/Chest: Effort normal and breath sounds normal. No respiratory distress.  Musculoskeletal: Normal range of motion. She exhibits no edema.        R Shoulder: Inspection reveals no abnormalities, atrophy or asymmetry. Palpation is normal with no tenderness over AC joint or bicipital groove. ROM is full in all planes. Rotator cuff strength normal throughout. No signs of impingement with negative Neer and Hawkin's tests, empty can. No painful arc and no drop arm sign.  Knee: Normal to inspection with no erythema or effusion or obvious bony abnormalities.  Mild joint line tenderness, no swelling.  No patellar tenderness or condyle tenderness. ROM normal in flexion and extension and lower leg rotation. Non painful patellar compression. Patellar and quadriceps  tendons unremarkable.  SLR negative for reproduction of symptoms.  Neurological: She is alert and oriented to person, place, and time.          Assessment & Plan:

## 2011-02-03 NOTE — Assessment & Plan Note (Signed)
Well controlled with neurontin, continue for now.

## 2011-02-09 ENCOUNTER — Ambulatory Visit (INDEPENDENT_AMBULATORY_CARE_PROVIDER_SITE_OTHER): Payer: Medicaid Other | Admitting: Family Medicine

## 2011-02-09 ENCOUNTER — Ambulatory Visit
Admission: RE | Admit: 2011-02-09 | Discharge: 2011-02-09 | Disposition: A | Discharge status: Home / Self Care | Payer: Medicaid Other | Source: Ambulatory Visit | Attending: Family Medicine | Admitting: Family Medicine

## 2011-02-09 ENCOUNTER — Encounter: Payer: Self-pay | Admitting: Family Medicine

## 2011-02-09 VITALS — BP 150/80 | HR 55 | Temp 98.6°F | Ht <= 58 in | Wt 127.1 lb

## 2011-02-09 DIAGNOSIS — R05 Cough: Secondary | ICD-10-CM

## 2011-02-09 MED ORDER — BENZONATATE 100 MG PO CAPS: 100.0000 mg | ORAL_CAPSULE | Freq: Three times a day (TID) | ORAL | Status: AC | PRN

## 2011-02-09 NOTE — Assessment & Plan Note (Signed)
Cough and congestion x1 week. No fevers but has had night sweats this week. Patient has tried TheraFlu. Cough up a small amount of blood in her sputum on Friday. Has a small amount of Rales on the right lower lung fields so we'll check a chest x-ray. Likely this is a viral URI and will treat with rest, Tessalon Perles for cough, Tylenol for pain. See back in one week if not better.  Patient is from [place] so would have risk of tuberculosis however the symptoms were not consistent with tuberculosis at this time.

## 2011-02-09 NOTE — Patient Instructions (Signed)
I would like you to go  for a chest x-ray today. I will call you at [phone] with the results Please take Tylenol for pain, Tessalon Perles for cough and continue with the salt water gargles, lots of liquids, and rest Come back and see Dr. Ashley Royalty in one week to discuss her joint pain.

## 2011-02-09 NOTE — Progress Notes (Signed)
Subjective:     Sherry Fitzgerald is a 66 y.o. female who presents for evaluation of symptoms of a URI. Symptoms include congestion, cough described as productive of white and blood streaked sputum, nasal congestion and no  fever. Onset of symptoms was 7 days ago, and has been gradually worsening since that time. Treatment to date: antihistamines and decongestants.     Review of Systems Constitutional: positive for night sweats Musculoskeletal:positive for stiff joints   Objective:    BP 150/80  Pulse 55  Temp(Src) 98.6 F (37 C) (Oral)  Ht 4' 8.75" (1.441 m)  Wt 127 lb 1.6 oz (57.652 kg)  BMI 27.75 kg/m2  SpO2 99% General appearance: alert and cooperative Head: Normocephalic, without obvious abnormality, atraumatic, sinuses nontender to percussion Throat: abnormal findings: mild oropharyngeal erythema Lungs: rales RLL Heart: regular rate and rhythm, S1, S2 normal, no murmur, click, rub or gallop   Assessment:    bronchitis and viral upper respiratory illness   Plan:    Discussed diagnosis and treatment of URI. Suggested symptomatic OTC remedies. Nasal saline spray for congestion. Follow up as needed.

## 2011-02-24 ENCOUNTER — Ambulatory Visit (INDEPENDENT_AMBULATORY_CARE_PROVIDER_SITE_OTHER): Payer: Medicaid Other | Admitting: Family Medicine

## 2011-02-24 ENCOUNTER — Encounter: Payer: Self-pay | Admitting: Family Medicine

## 2011-02-24 VITALS — BP 158/82 | HR 61 | Temp 97.0°F | Ht <= 58 in | Wt 127.0 lb

## 2011-02-24 DIAGNOSIS — I10 Essential (primary) hypertension: Secondary | ICD-10-CM

## 2011-02-24 DIAGNOSIS — M25511 Pain in right shoulder: Secondary | ICD-10-CM

## 2011-02-24 DIAGNOSIS — M25519 Pain in unspecified shoulder: Secondary | ICD-10-CM

## 2011-02-24 MED ORDER — LISINOPRIL 20 MG PO TABS: 20.0000 mg | ORAL_TABLET | Freq: Every day | ORAL | Status: DC

## 2011-02-24 MED ORDER — GABAPENTIN 300 MG PO CAPS: ORAL_CAPSULE | ORAL | Status: DC

## 2011-02-24 NOTE — Patient Instructions (Addendum)
Please increase the lisinopril to 20 mg every day.  This is 2 of the tablets that you have at home.  I will call in a prescription for more of the medicine.  This is for your your blood pressure.  Please increase the gabapentin (neurontin).  You can take 2 of the 300 mg tablets at night and continue with 1 in the morning and 1 in the middle of the day.  Do this for 3 days.  Then, increase to 2 tablets at night and 2 in the morning and 1 in the middle of the day for 3 more days.  Then, take 3 tablets in the morning, 3 at mid-day and 3 in the night.    Please come back and see [place] in 2 weeks.    Let us know if you need [place] before your next appointment.

## 2011-02-24 NOTE — Assessment & Plan Note (Signed)
Pt has done well in past on gabapentin, unsure why the recent exacerbation in pain. Could be frozen shoulder.   Will increase pain meds.  If no relief, consider referral to sport medicine.

## 2011-02-24 NOTE — Progress Notes (Signed)
  Subjective:    Patient ID: Sherry Fitzgerald, female    DOB: 05-Sep-1945, 66 y.o.   MRN: [id number]  HPI F/u arm pain. 66 yo female here for follow up of R arm pain.  Per previous notes, she was much improved on the current regimen, but now reports pain in shoulder, elbow and wrist.  Pain is like "electricity".  Sometimes relieved by tramdol, which she takes occasionally.  Having trouble driving due to arm pain, Feels it might be because she worked in a cold environment  For many years.  She reports she had financial problems and would like to get a job.  She also reports fleeting shortness of breath and episodes of her chest turning blue.  This occurs at rest and with exertion.   SH/FH updated.     Review of Systems     Objective:   Physical Exam  Nursing note and vitals reviewed. Constitutional: She appears well-developed and well-nourished. No distress.  Cardiovascular: Normal rate, regular rhythm and normal heart sounds.  Exam reveals no gallop and no friction rub.   No murmur heard. Pulmonary/Chest: Effort normal and breath sounds normal. No respiratory distress. She has no wheezes. She has no rales.  Musculoskeletal:       Decrease active ROM R shoulder, elbow and wristr in all directions.  Decreased active flexion/extension of elbow on R.  Improved passive flexion/extension of elbow with some distraction.  No erythema/swelling/warmth of joints noted.  +TTP later al epicondyle on R, no increase in pain with passive supination.    Skin: She is not diaphoretic.          Assessment & Plan:

## 2011-02-24 NOTE — Assessment & Plan Note (Signed)
Still not at goal.  Increase her lisinopril to 20 daily.  See pt instructions.

## 2011-03-04 ENCOUNTER — Telehealth: Payer: Self-pay | Admitting: Clinical

## 2011-03-04 NOTE — Telephone Encounter (Signed)
Clinical Child psychotherapist (CSW) received referral to assist pt who is in need of financial assistance. CSW contacted pt number on demographics however CSW was informed [phone] was an incorrect number. CSW called pt home number of [phone] and left a message for pt. CSW also tried to call pt sons to see if pt had another number however CSW was unable to leave a message. CSW has provided her office number for pt to return call.  Theresia Bough, MSW, Theresia Majors [phone]

## 2011-03-06 ENCOUNTER — Telehealth: Payer: Self-pay | Admitting: Clinical
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
## 2011-03-18 ENCOUNTER — Encounter: Payer: Self-pay | Admitting: Family Medicine

## 2011-03-18 ENCOUNTER — Ambulatory Visit (INDEPENDENT_AMBULATORY_CARE_PROVIDER_SITE_OTHER): Payer: Medicaid Other | Admitting: Family Medicine

## 2011-03-18 ENCOUNTER — Encounter: Payer: Self-pay | Admitting: Clinical

## 2011-03-18 VITALS — BP 177/107 | HR 69 | Ht <= 58 in | Wt 125.0 lb

## 2011-03-18 DIAGNOSIS — M25519 Pain in unspecified shoulder: Secondary | ICD-10-CM

## 2011-03-18 DIAGNOSIS — I10 Essential (primary) hypertension: Secondary | ICD-10-CM

## 2011-03-18 DIAGNOSIS — R05 Cough: Secondary | ICD-10-CM

## 2011-03-18 DIAGNOSIS — M25511 Pain in right shoulder: Secondary | ICD-10-CM

## 2011-03-18 MED ORDER — LISINOPRIL 40 MG PO TABS: 40.0000 mg | ORAL_TABLET | Freq: Every day | ORAL | Status: DC

## 2011-03-18 MED ORDER — FLUTICASONE PROPIONATE 50 MCG/ACT NA SUSP: 2.0000 | Freq: Every day | NASAL | Status: DC

## 2011-03-18 NOTE — Patient Instructions (Signed)
Thank you for coming in today, it was good to see you I would like for you to increase your lisinopril to 40mg .  I have sent in a new prescription but you can take two of the 20mg  tablets until you run out of those. I am going to refer you to our sports medicine center for your shoulder I will see you back in 2-3 weeks to see how your cough is doing and to recheck your blood pressure
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
## 2011-03-24 ENCOUNTER — Encounter: Payer: Self-pay | Admitting: Sports Medicine

## 2011-03-24 ENCOUNTER — Ambulatory Visit
Admission: RE | Admit: 2011-03-24 | Discharge: 2011-03-24 | Disposition: A | Discharge status: Home / Self Care | Payer: Medicaid Other | Source: Ambulatory Visit | Attending: Sports Medicine | Admitting: Sports Medicine

## 2011-03-24 ENCOUNTER — Ambulatory Visit (INDEPENDENT_AMBULATORY_CARE_PROVIDER_SITE_OTHER): Payer: Medicaid Other | Admitting: Sports Medicine

## 2011-03-24 ENCOUNTER — Telehealth: Payer: Self-pay | Admitting: Clinical

## 2011-03-24 VITALS — BP 167/92 | HR 68

## 2011-03-24 DIAGNOSIS — M25511 Pain in right shoulder: Secondary | ICD-10-CM

## 2011-03-24 DIAGNOSIS — M25519 Pain in unspecified shoulder: Secondary | ICD-10-CM

## 2011-03-24 DIAGNOSIS — M79605 Pain in left leg: Secondary | ICD-10-CM

## 2011-03-24 DIAGNOSIS — M79609 Pain in unspecified limb: Secondary | ICD-10-CM

## 2011-03-24 MED ORDER — PREDNISONE 50 MG PO TABS: ORAL_TABLET | ORAL | Status: AC

## 2011-03-24 NOTE — Patient Instructions (Signed)
PT for neck and back. Prednisone. XR of your neck and back. [address]. Come back to see me in 4-6 weeks.

## 2011-03-24 NOTE — Assessment & Plan Note (Addendum)
Suspect predominantly left-sided lumbar radicular symptoms. Will XR lumbar spine. Prednisone burst. PT. RTC 4 weeks.  If no better will consider MRI of her lumbar spine.

## 2011-03-24 NOTE — Assessment & Plan Note (Addendum)
Symptoms suggestive of C5 or C6 right radiculopathy. Will do prednisone. PT. C spine XR.  She will come back to see [place] in 4 weeks after physical therapy . If no better we can consider MRI of her cervical spine.

## 2011-03-24 NOTE — Progress Notes (Signed)
  Subjective:    Patient ID: Sherry Fitzgerald, female    DOB: December 28, 1945, 66 y.o.   MRN: [id number]  HPI I am seeing this patient is a referral from [facility] family practice.  The patient is a very pleasant 66 year old female who comes in with complaints of right neck and shoulder pain, as well as low back and left leg pain.  The above has been present for over 3 years, the right shoulder pain starts in the neck and runs down the outside of the upper lower arm into her fingers. She does describes this as a numb and tingly-like sensation. She's never actually had any dedicated imaging, or treatment for cervical radiculopathy.  Her low back pain has been present for nearly as long, and radiates down her leg in an electric-like sensation. Again she has never had dedicated treatment directed at lumbar radiculopathy.  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  Past medical history, surgical history, family history, social history, allergies, and medications reviewed from the medical record and no changes needed.   Review of Systems    No fevers, chills, night sweats, weight loss, chest pain, or shortness of breath.  Social History: Non-smoker. Objective:   Physical Exam General:  Well developed, well nourished, and in no acute distress. Neuro:  Alert and oriented x3, extra-ocular muscles intact. Skin: Warm and dry, no rashes noted. Respiratory:  Not using accessory muscles, speaking in full sentences. Musculoskeletal:  I am able to take her right upper extremity through a normal range of motion. She really does not have any impingement signs.  Neck: Inspection unremarkable. No palpable stepoffs. Negative Spurling's maneuver. Full neck range of motion Grip strength somewhat weak and right-hand, sensation somewhat hypoesthetic in the C5 and C6 distribution. Strength good C4 to T1 distribution when coaxed Reflexes normal  Back Exam: Inspection:  Unremarkable Motion: Flexion 45 deg, Extension 45 deg, Side Bending to 45 deg bilaterally,  Rotation to 45 deg bilaterally SLR laying:  Negative XSLR laying: Negative Palpable tenderness: None FABER: negative Sensory change: Gross sensation intact to all lumbar and sacral dermatomes. Reflexes: 2+ at both patellar tendons, 2+ at achilles tendons, Babinski's downgoing.  Strength at foot Plantar-flexion: 5/5    Dorsi-flexion: 5/5    Eversion: 5/5   Inversion: 5/5 Leg strength Quad: 5/5   Hamstring: 5/5   Hip flexor: 5/5   Hip abductors: 5/5 Gait unremarkable.     Assessment & Plan:

## 2011-03-26 NOTE — Telephone Encounter (Signed)
Error. Please see 3/13 note

## 2011-04-05 NOTE — Progress Notes (Signed)
  Subjective:    Patient ID: Sherry Fitzgerald, female    DOB: Mar 16, 1945, 66 y.o.   MRN: [id number]  HPI 1. F/u shoulder pain:  Still with continued R shoulder pain.  Was doing better initially with gabapentin and tramadol.  Still states that pain feels like "electric shocks" going down her arm along with some weakness.  Thinks this is from "working in the cold"  2.  HTN:  Pressure still high, she has increased her lisinopril to 20mg  daily.  Denies high salt diet, addition of extra salt.   She denies any chest pain, dizziness, palpitations.   3. Cough:  Mild cough and congestion for a few days.  Improved with antihistamines and decongestants.  Denies fever, chills, shortness of breath, hemoptysis.     Review of Systems     Objective:   Physical Exam  Constitutional: She appears well-developed and well-nourished. No distress.  HENT:  Head: Normocephalic and atraumatic.  Mouth/Throat: Oropharynx is clear and moist. No oropharyngeal exudate.  Neck: Neck supple.  Cardiovascular: Normal rate, regular rhythm and normal heart sounds.   Pulmonary/Chest: Effort normal and breath sounds normal. No respiratory distress.  Musculoskeletal:       No shoulder instability No signs of impingement Mild decreased ROM with overhead movement Some decreased sensation in c5-c6 distribution, grip strength 4/5.   SPurlings negative.             Assessment & Plan:

## 2011-04-05 NOTE — Assessment & Plan Note (Signed)
Increase lisinopril to 40mg  daily, return 2-3 weeks to f/u.

## 2011-04-05 NOTE — Assessment & Plan Note (Signed)
Likely URI vs. Allergies.  Continue antihistamines, symptomatic tx.  Advised to return if worsening.

## 2011-04-05 NOTE — Assessment & Plan Note (Signed)
Pain seems to be radicular pain.  Continue neurontin and tramadol, will refer to [facility] clinic for further eval.  Will hold off on imaging for now to see if SM would rather have plain films vs MRI

## 2011-04-10 ENCOUNTER — Encounter: Payer: Self-pay | Admitting: Family Medicine

## 2011-04-10 ENCOUNTER — Ambulatory Visit (INDEPENDENT_AMBULATORY_CARE_PROVIDER_SITE_OTHER): Payer: Medicaid Other | Admitting: Family Medicine

## 2011-04-10 VITALS — BP 145/85 | HR 64 | Ht <= 58 in | Wt 126.0 lb

## 2011-04-10 DIAGNOSIS — M25519 Pain in unspecified shoulder: Secondary | ICD-10-CM

## 2011-04-10 DIAGNOSIS — I10 Essential (primary) hypertension: Secondary | ICD-10-CM

## 2011-04-10 DIAGNOSIS — M25511 Pain in right shoulder: Secondary | ICD-10-CM

## 2011-04-10 LAB — BASIC METABOLIC PANEL
BUN: 8 mg/dL (ref 6–23)
CO2: 30 mEq/L (ref 19–32)
Chloride: 104 mEq/L (ref 96–112)
Potassium: 4.8 mEq/L (ref 3.5–5.3)

## 2011-04-10 NOTE — Patient Instructions (Signed)
Thank you for coming in today, it was good to see you For your shoulder pain you can increase your tramadol to 50mg  every 6 hours if you need to.   We will try to have them set up physical therapy for you again. Your Blood pressure looks better, continue the medication I would like for you to make an appointment in the next few weeks for a complete physical with pap smear.

## 2011-04-22 NOTE — Progress Notes (Signed)
  Subjective:    Patient ID: MARSI TURVEY, female    DOB: July 23, 1945, 66 y.o.   MRN: [id number]  HPI 1.  F/u arm pain:  Continued R arm pain.  Was seen in [facility] clinic, thought to be radiculopathy, rx prednisone and PT.  Prednisone helped for short time.  States she has not heard from PT.  Gabapentin and tramadol still work fairly well.  Feels weak in R should with decreased grip strength.   2. HTN: Taking increased dose of lisinopril, tolerating well.  Does not check BP at home.   Denies dizziness, chest pain, palpitations,headache.  Review of Systems     Objective:   Physical Exam  Constitutional: She is oriented to person, place, and time. She appears well-developed and well-nourished.  Musculoskeletal:       Unchanged from previous  Neurological: She is alert and oriented to person, place, and time.  Psychiatric: She has a normal mood and affect. Her behavior is normal.          Assessment & Plan:

## 2011-04-22 NOTE — Assessment & Plan Note (Addendum)
Neck films with spondy at C3-C4 and C4-C5 R>L.  Order confirmed to be entered for PT, and patient phone number in EMR confirmed.  Told her to expect to hear from them soon.  If no help with PT, may need MRI for evaluation of discs and foramina.  Continue current meds, f/u with SM as scheduled.

## 2011-04-22 NOTE — Assessment & Plan Note (Signed)
Better control, continue current meds.

## 2011-04-28 ENCOUNTER — Ambulatory Visit (INDEPENDENT_AMBULATORY_CARE_PROVIDER_SITE_OTHER): Payer: Medicaid Other | Admitting: Sports Medicine

## 2011-04-28 VITALS — BP 173/66

## 2011-04-28 DIAGNOSIS — M25519 Pain in unspecified shoulder: Secondary | ICD-10-CM

## 2011-04-28 DIAGNOSIS — M79605 Pain in left leg: Secondary | ICD-10-CM

## 2011-04-28 DIAGNOSIS — M79609 Pain in unspecified limb: Secondary | ICD-10-CM

## 2011-04-28 DIAGNOSIS — M25511 Pain in right shoulder: Secondary | ICD-10-CM

## 2011-04-28 DIAGNOSIS — IMO0002 Reserved for concepts with insufficient information to code with codable children: Secondary | ICD-10-CM

## 2011-04-28 DIAGNOSIS — M5416 Radiculopathy, lumbar region: Secondary | ICD-10-CM

## 2011-04-28 DIAGNOSIS — M5412 Radiculopathy, cervical region: Secondary | ICD-10-CM

## 2011-04-28 NOTE — Assessment & Plan Note (Signed)
I think we have not yet fully gone through conservative treatment. I would like her to finish a full 4 weeks of his physical therapy, and then return to see me. Should she not be better at that point I would be happy to obtain a lumbar spine MRI to aid in transforaminal injection planning.

## 2011-04-28 NOTE — Progress Notes (Signed)
  Subjective:    Patient ID: Sherry Fitzgerald, female    DOB: 1945-12-20, 66 y.o.   MRN: [id number]  HPI This patient comes back in for followup of her left leg and right shoulder pain. I've given her a presumptive diagnosis of right C4/C5 radiculopathy,and left-sided lumbar radiculopathy. X-rays of her cervical spine did show a large right-sided osteophyte causing foraminal stenosis of the C4-C5 level on the oblique view.  Her lumbar spine x-rays were essentially normal. She was having some left-sided radicular pain down her leg as well. I put her on a burst of prednisone, which did help her symptoms for several days, however they returned afterwards. Unfortunately she has not yet gone for her sessions of physical therapy.  On further review of the chart her outpatient rehabilitation department did call the patient and left a message.   Review of Systems    No fevers, chills, night sweats, weight loss, chest pain, or shortness of breath.  Social History: Non-smoker. Objective:   Physical Exam  General:  Well developed, well nourished, and in no acute distress. Neuro:  Alert and oriented x3, extra-ocular muscles intact. Skin: Warm and dry, no rashes noted. Respiratory:  Not using accessory muscles, speaking in full sentences.    Assessment & Plan:

## 2011-04-28 NOTE — Assessment & Plan Note (Signed)
I think that her pain is now clearly a right-sided C4 or C5 radiculopathy. Again I would like her to go through formal physical therapy for at least 4 weeks, she will call and make an appointment to see me afterwards. Should she not be better, I would also obtain an MRI of her cervical spine, to assist in transforaminal injection planning.

## 2011-05-06 ENCOUNTER — Encounter: Payer: Self-pay | Admitting: Family Medicine

## 2011-05-06 ENCOUNTER — Ambulatory Visit (INDEPENDENT_AMBULATORY_CARE_PROVIDER_SITE_OTHER): Payer: Medicaid Other | Admitting: Family Medicine

## 2011-05-06 ENCOUNTER — Other Ambulatory Visit: Payer: Self-pay | Admitting: Family Medicine

## 2011-05-06 ENCOUNTER — Other Ambulatory Visit (HOSPITAL_COMMUNITY)
Admission: RE | Admit: 2011-05-06 | Discharge: 2011-05-06 | Disposition: A | Discharge status: Home / Self Care | Payer: Medicaid Other | Source: Ambulatory Visit | Attending: Family Medicine | Admitting: Family Medicine

## 2011-05-06 VITALS — BP 164/82 | HR 62 | Ht <= 58 in | Wt 130.0 lb

## 2011-05-06 DIAGNOSIS — E785 Hyperlipidemia, unspecified: Secondary | ICD-10-CM

## 2011-05-06 DIAGNOSIS — Z0000 Encounter for general adult medical examination without abnormal findings: Secondary | ICD-10-CM

## 2011-05-06 DIAGNOSIS — Z01419 Encounter for gynecological examination (general) (routine) without abnormal findings: Secondary | ICD-10-CM | POA: Insufficient documentation

## 2011-05-06 DIAGNOSIS — I10 Essential (primary) hypertension: Secondary | ICD-10-CM

## 2011-05-06 DIAGNOSIS — Z1231 Encounter for screening mammogram for malignant neoplasm of breast: Secondary | ICD-10-CM

## 2011-05-06 DIAGNOSIS — Z124 Encounter for screening for malignant neoplasm of cervix: Secondary | ICD-10-CM

## 2011-05-06 LAB — LIPID PANEL
HDL: 63 mg/dL (ref >39)
LDL Cholesterol: 228 mg/dL — ABNORMAL HIGH (ref 0–99)
Triglycerides: 108 mg/dL (ref <150)
VLDL: 22 mg/dL (ref 0–40)

## 2011-05-06 MED ORDER — ATORVASTATIN CALCIUM 20 MG PO TABS: 20.0000 mg | ORAL_TABLET | Freq: Every day | ORAL | Status: DC

## 2011-05-06 NOTE — Progress Notes (Signed)
  Subjective:     Sherry Fitzgerald is a 66 y.o. female and is here for a comprehensive physical exam. The patient reports problems - Continued shoulder pain, followed by sports medicine.  History   Social History  . Marital Status: Divorced    Spouse Name: N/A    Number of Children: N/A  . Years of Education: N/A   Occupational History  . Not on file.   Social History Main Topics  . Smoking status: Never Smoker   . Smokeless tobacco: Never Used  . Alcohol Use: Not on file  . Drug Use: Not on file  . Sexually Active: Not on file   Other Topics Concern  . Not on file   Social History Narrative   Former Editor, commissioning.  Lives alone, but has 5 children in the area who are grown and supportive.  +Financial difficulties - trouble paying bills.   Health Maintenance  Topic Date Due  . Pap Smear  01/01/1966  . Colonoscopy  01/01/1998  . Mammogram  05/09/2007  . Zostavax  01/02/2008  . Influenza Vaccine  10/06/2011  . Tetanus/tdap  12/05/2012    The following portions of the patient's history were reviewed and updated as appropriate: allergies, current medications, past family history, past medical history, past social history, past surgical history and problem list.  Review of Systems Constitutional: negative for anorexia, chills and fevers Respiratory: negative for cough and wheezing Cardiovascular: negative for chest pain, chest pressure/discomfort, dyspnea, irregular heart beat, lower extremity edema and palpitations Gastrointestinal: negative for abdominal pain, constipation, diarrhea, melena, nausea and reflux symptoms Genitourinary:negative for vaginal discharge Integument/breast: negative for breast lump, breast tenderness, nipple discharge and skin color change Neurological: negative for coordination problems, dizziness, gait problems, headaches and weakness   Objective:    General appearance: alert, cooperative and no distress Head: Normocephalic, without obvious  abnormality, atraumatic Ears: normal TM's and external ear canals both ears Nose: Nares normal. Septum midline. Mucosa normal. No drainage or sinus tenderness. Throat: lips, mucosa, and tongue normal; teeth and gums normal Neck: no adenopathy, no carotid bruit, supple, symmetrical, trachea midline and thyroid not enlarged, symmetric, no tenderness/mass/nodules Lungs: clear to auscultation bilaterally Breasts: normal appearance, no masses or tenderness Heart: regular rate and rhythm, S1, S2 normal, no murmur, click, rub or gallop Abdomen: soft, non-tender; bowel sounds normal; no masses,  no organomegaly Pelvic: cervix normal in appearance, no adnexal masses or tenderness, no cervical motion tenderness, rectovaginal septum normal, uterus normal size, shape, and consistency, vagina normal without discharge and Area on R labia consistent with condyloma. Extremities: extremities normal, atraumatic, no cyanosis or edema Pulses: 2+ and symmetric Skin: Skin color, texture, turgor normal. No rashes or lesions Lymph nodes: Cervical, supraclavicular, and axillary nodes normal. Neurologic: Grossly normal    Assessment:    Healthy female exam.      Plan:  -Blood pressure continues to be high in office, but getting normal (systolic 130) when checking at home.  Will make no changes to current medications, may be a candidate for 24 hour ambulatory BP monitoring if we continue to get high reading in office.   -Pap done, given form to call and make appt for mammogram.  Reports colonoscopy at [facility] in 2005, will try to obtain report.   -Change statin to lipitor to prevent interaction of simvastatin and amlodipine.  Check full lipid panel today.    See After Visit Summary for Counseling Recommendations

## 2011-05-06 NOTE — Patient Instructions (Addendum)
Thank you for coming in today, it was good to see you I will check on your PT orders I will let you know the results of your labs when they return I am going to change your cholesterol medication as well to lipitor because there is a potential reaction between amlodipine and simvastatin.   Please call the breast center to arrange for a mammogram.

## 2011-05-08 ENCOUNTER — Encounter: Payer: Medicaid Other | Admitting: Family Medicine

## 2011-05-15 ENCOUNTER — Telehealth: Payer: Self-pay | Admitting: Family Medicine

## 2011-05-15 NOTE — Telephone Encounter (Signed)
Colonoscopy report called for, spoke to MR dept at [facility] will fax report from [facility].

## 2011-05-15 NOTE — Telephone Encounter (Signed)
Message copied by Barnie Alderman on Fri May 15, 2011 11:55 AM ------      Message from: Everrett Coombe      Created: Wed May 06, 2011  9:58 AM       Reports that she had colonoscopy in 2005 at [facility].  There is an encounter from 04/13/2003 from [facility] lab but no report is attached.  Can we see if we can obtain report from [facility].            Thanks

## 2011-05-22 ENCOUNTER — Ambulatory Visit
Admission: RE | Admit: 2011-05-22 | Discharge: 2011-05-22 | Disposition: A | Discharge status: Home / Self Care | Payer: Medicaid Other | Source: Ambulatory Visit | Attending: Family Medicine | Admitting: Family Medicine

## 2011-05-22 DIAGNOSIS — Z1231 Encounter for screening mammogram for malignant neoplasm of breast: Secondary | ICD-10-CM

## 2011-05-26 ENCOUNTER — Ambulatory Visit (INDEPENDENT_AMBULATORY_CARE_PROVIDER_SITE_OTHER): Payer: Medicaid Other | Admitting: Sports Medicine

## 2011-05-26 VITALS — BP 140/88

## 2011-05-26 DIAGNOSIS — M5416 Radiculopathy, lumbar region: Secondary | ICD-10-CM

## 2011-05-26 DIAGNOSIS — M5412 Radiculopathy, cervical region: Secondary | ICD-10-CM

## 2011-05-26 DIAGNOSIS — IMO0002 Reserved for concepts with insufficient information to code with codable children: Secondary | ICD-10-CM

## 2011-05-26 NOTE — Assessment & Plan Note (Signed)
Doing well with gabapentin. Has still not yet done PT. Will RTC prn for this. Discussed MRI and transforaminal injections, may revisit this later if needed. 

## 2011-05-26 NOTE — Progress Notes (Signed)
  Subjective:    Patient ID: Sherry Fitzgerald, female    DOB: [birth year], 66 y.o.   MRN: [id number]  HPI This patient comes back in for followup of her left leg and right shoulder pain. I've given her a presumptive diagnosis of right C4/C5 radiculopathy,and left-sided lumbar radiculopathy. X-rays of her cervical spine did show a large right-sided osteophyte causing foraminal stenosis of the C4-C5 level on the oblique view.  Her lumbar spine x-rays were essentially normal. She was having some left-sided radicular pain down her leg as well. I put her on a burst of prednisone, which did help her symptoms.  For the past 2 visits she has not yet done PT.  On further review of the chart her outpatient rehabilitation department did call the patient and left a message.  Overall today she is doing very well, she does note the gabapentin one taken more than once a day makes her feel somewhat groggy, however it does improve her symptoms significantly.  Review of Systems    No fevers, chills, night sweats, weight loss, chest pain, or shortness of breath.  Social History: Non-smoker. Objective:   Physical Exam General:  Well developed, well nourished, and in no acute distress. Neuro:  Alert and oriented x3, extra-ocular muscles intact. Skin: Warm and dry, no rashes noted. Respiratory:  Not using accessory muscles, speaking in full sentences. Musculoskeletal: Neck: Inspection unremarkable. No palpable stepoffs. Negative Spurling's maneuver. Range of motion limited in extension, as well as rotation. Grip strength and sensation normal in bilateral hands Weak, with some hypoesthesia in the right C5 distribution. Reflexes normal     Assessment & Plan:

## 2011-05-26 NOTE — Assessment & Plan Note (Signed)
Doing well with gabapentin. Has still not yet done PT. Will RTC prn for this. Discussed MRI and transforaminal injections, may revisit this later if needed.

## 2011-07-28 ENCOUNTER — Ambulatory Visit (INDEPENDENT_AMBULATORY_CARE_PROVIDER_SITE_OTHER): Payer: Medicaid Other | Admitting: Family Medicine

## 2011-07-28 ENCOUNTER — Encounter: Payer: Self-pay | Admitting: Family Medicine

## 2011-07-28 VITALS — BP 158/84 | HR 60 | Ht <= 58 in | Wt 135.5 lb

## 2011-07-28 DIAGNOSIS — R29898 Other symptoms and signs involving the musculoskeletal system: Secondary | ICD-10-CM | POA: Insufficient documentation

## 2011-07-28 MED ORDER — CANE MISC: Status: DC

## 2011-07-28 NOTE — Assessment & Plan Note (Addendum)
Significant R leg weakness with radicular signs.  Will refer to neurology, may need nerve conduction studies for further evaluation.  Instructed to ambulate with cane for safety.  Continue gabapentin/tramadol for pain control.

## 2011-07-28 NOTE — Patient Instructions (Addendum)
Thank you for coming in today, it was good to see you We will get you a referral to neurology, they will call you to set that up. Use the cane for ambulation.

## 2011-07-28 NOTE — Progress Notes (Signed)
  Subjective:    Patient ID: Sherry Fitzgerald, female    DOB: 06-Mar-1945, 66 y.o.   MRN: [id number]  HPI  1. R Leg Pain:  Patient with complaint of R leg and knee pain.  This has been been getting increasingly worse over the past few months.  Was seen as sports medicine and recommended strengthening exercises for lumbar radiculopathy symptoms.  Describes pain as "electricity" down back of her leg and knee.  Has had increased weakness on R side to the point where it is difficult for her to walk and she finds herself falling at times.  Currently on gabapentin and tramadol which helps pain but does not help weakness.  She denies bowel or bladder incontinence, weakness in L leg.    Review of Systems Per HPI    Objective:   Physical Exam  Constitutional: She appears well-nourished. No distress.  Cardiovascular: Normal rate and regular rhythm.   Pulmonary/Chest: Effort normal and breath sounds normal.  Musculoskeletal: She exhibits no edema.       Tenderness to palpation along joint line of knees bilaterally.  Ligamentous structures intact.  Strength testing with 4/5 strength on R.  5/5 strength on L.  With walking she has antalgic gait, and R leg gives way with near fall with ambulation.  Reflexes are 2+ bilaterally.  SLR equivocal.    Neurological: She is alert.          Assessment & Plan:

## 2011-08-20 ENCOUNTER — Other Ambulatory Visit: Payer: Self-pay | Admitting: Neurology

## 2011-08-20 DIAGNOSIS — M25511 Pain in right shoulder: Secondary | ICD-10-CM

## 2011-08-20 DIAGNOSIS — M79604 Pain in right leg: Secondary | ICD-10-CM

## 2011-08-20 DIAGNOSIS — M542 Cervicalgia: Secondary | ICD-10-CM

## 2011-08-20 DIAGNOSIS — M545 Low back pain: Secondary | ICD-10-CM

## 2011-08-31 ENCOUNTER — Telehealth: Payer: Self-pay | Admitting: *Deleted

## 2011-08-31 NOTE — Telephone Encounter (Signed)
Due to patient having Medicaid, office calling to request NPI #  to authorize appt.  NPI # given.  Richardson, Jeannette Ann, RN   

## 2012-01-06 HISTORY — PX: CHOLECYSTECTOMY: SHX55

## 2012-06-08 ENCOUNTER — Ambulatory Visit (INDEPENDENT_AMBULATORY_CARE_PROVIDER_SITE_OTHER): Payer: Medicaid Other | Admitting: Family Medicine

## 2012-06-08 ENCOUNTER — Encounter: Payer: Self-pay | Admitting: Family Medicine

## 2012-06-08 VITALS — BP 165/87 | HR 56 | Temp 98.2°F | Ht <= 58 in | Wt 123.0 lb

## 2012-06-08 DIAGNOSIS — M25561 Pain in right knee: Secondary | ICD-10-CM

## 2012-06-08 DIAGNOSIS — M25511 Pain in right shoulder: Secondary | ICD-10-CM

## 2012-06-08 DIAGNOSIS — I10 Essential (primary) hypertension: Secondary | ICD-10-CM

## 2012-06-08 DIAGNOSIS — M25569 Pain in unspecified knee: Secondary | ICD-10-CM

## 2012-06-08 DIAGNOSIS — M25519 Pain in unspecified shoulder: Secondary | ICD-10-CM

## 2012-06-08 MED ORDER — TRAMADOL HCL 50 MG PO TABS: 50.0000 mg | ORAL_TABLET | Freq: Three times a day (TID) | ORAL | Status: DC | PRN

## 2012-06-08 MED ORDER — LISINOPRIL-HYDROCHLOROTHIAZIDE 20-12.5 MG PO TABS: 2.0000 | ORAL_TABLET | Freq: Every day | ORAL | Status: DC

## 2012-06-08 MED ORDER — GABAPENTIN 300 MG PO CAPS: ORAL_CAPSULE | ORAL | Status: DC

## 2012-06-08 NOTE — Assessment & Plan Note (Signed)
BP elevated today but not using any medication.  Will add back on lisinopril/hctz, hold off on amlodipine for now.  Return for lab work in 2 weeks.
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
## 2012-06-08 NOTE — Patient Instructions (Signed)
Thank you for coming in today, it was good to see you I am sending in a medication for your blood pressure Return in two weeks for a lab visit Follow up here in 2-3 months I will send in medication to help with your pain in your knees.

## 2012-06-21 ENCOUNTER — Other Ambulatory Visit: Payer: Medicaid Other

## 2012-06-21 DIAGNOSIS — I10 Essential (primary) hypertension: Secondary | ICD-10-CM

## 2012-06-21 LAB — COMPREHENSIVE METABOLIC PANEL
ALT: 14 U/L (ref 0–35)
AST: 18 U/L (ref 0–37)
CO2: 28 mEq/L (ref 19–32)
Chloride: 102 mEq/L (ref 96–112)
Sodium: 137 mEq/L (ref 135–145)
Total Bilirubin: 0.3 mg/dL (ref 0.3–1.2)
Total Protein: 7.4 g/dL (ref 6.0–8.3)

## 2012-06-21 NOTE — Progress Notes (Signed)
CMP DONE TODAY Sherry Fitzgerald 

## 2012-07-27 ENCOUNTER — Encounter: Payer: Self-pay | Admitting: Emergency Medicine

## 2012-07-27 ENCOUNTER — Ambulatory Visit (INDEPENDENT_AMBULATORY_CARE_PROVIDER_SITE_OTHER): Payer: Medicaid Other | Admitting: Emergency Medicine

## 2012-07-27 ENCOUNTER — Telehealth: Payer: Self-pay | Admitting: Emergency Medicine

## 2012-07-27 VITALS — BP 136/83 | HR 53 | Temp 98.3°F | Ht <= 58 in | Wt 121.0 lb

## 2012-07-27 DIAGNOSIS — R0789 Other chest pain: Secondary | ICD-10-CM | POA: Insufficient documentation

## 2012-07-27 DIAGNOSIS — R079 Chest pain, unspecified: Secondary | ICD-10-CM

## 2012-07-27 DIAGNOSIS — R071 Chest pain on breathing: Secondary | ICD-10-CM

## 2012-07-27 LAB — D-DIMER, QUANTITATIVE: D-Dimer, Quant: 0.58 ug/mL-FEU — ABNORMAL HIGH (ref 0.00–0.48)

## 2012-07-27 MED ORDER — MELOXICAM 15 MG PO TABS: 15.0000 mg | ORAL_TABLET | Freq: Every day | ORAL | Status: DC

## 2012-07-27 NOTE — Telephone Encounter (Signed)
Attempted to call patient again, no answer.[name], Sherry Fitzgerald

## 2012-07-27 NOTE — Progress Notes (Signed)
  Subjective:    Patient ID: Sherry Fitzgerald, female    DOB: May 25, 1945, 67 y.o.   MRN: [id number]  HPI Sherry Fitzgerald is here for a SDA for right breast pain.  [name] is here today for evaluation of right breast pain.  She states that is started 11 days ago.  Denies any change in activity or trauma to the area.  Pain is located on the chest wall 6cm above the nipple.  She states that is will wrap around to her back as well.  It is worse with twisting movement, deep breaths, and palpation.  She has been using ice and heat as well as some massage with helps some.  Has not been taking any medication for it.  Denies any trouble breathing, long car trips, pain or swelling in her legs.  She is not on any hormone therapy.   I have reviewed and updated the following as appropriate: allergies and current medications SHx: non smoker  Review of Systems See HPI    Objective:   Physical Exam BP 136/83  Pulse 53  Temp(Src) 98.3 F (36.8 C) (Oral)  Ht 4' 9.5" (1.461 m)  Wt 121 lb (54.885 kg)  BMI 25.71 kg/m2 Gen: alert, cooperative, NAD HEENT: AT/[place], sclera white, MMM Neck: supple CV: RRR, no murmurs Pulm: CTAB, no wheezes or rales Chest: tender to palpation in 3rd rib space above nipple with reproduction of pain Ext: no edema, no calf tenderness     Assessment & Plan:

## 2012-07-27 NOTE — Patient Instructions (Addendum)
It was nice to meet you!  I think you strained a muscle in your chest. - continue with the heat and ice - continue with massage - start Meloxicam 1 pill daily for 1 week, then as needed.  I am checking a lab for a blood clot in your lung.  I will call you if anything is wrong.  Follow up in 2 weeks, if not improving.

## 2012-07-27 NOTE — Assessment & Plan Note (Signed)
History and exam most consistent with muscle strain. Some concern for PE given pain with deep breath and radiation to back; no risk factors or signs of DVT. Will check d-dimer. Continue heat and ice, massage. Meloxicam 15mg  daily x1 week, then prn. Follow up if not better in 2 weeks.

## 2012-07-27 NOTE — Telephone Encounter (Signed)
D-dimer came back mildly elevated at 0.58.  Given some pleuritic type symptoms and no clear cause for MSK strain, will evaluate further with CTA of chest.  Recent creatinine normal at 0.75.

## 2012-07-27 NOTE — Telephone Encounter (Signed)
Attempted to call patient, no voicemail set up. If patient returns call she has a CT scheduled for Friday at [facility] at 9:15, NPO 4 hours prior. She may drink water and take any medications.Champion Corales, Rodena Medin

## 2012-07-29 ENCOUNTER — Ambulatory Visit (HOSPITAL_COMMUNITY): Payer: Medicaid Other

## 2012-08-02 ENCOUNTER — Telehealth: Payer: Self-pay | Admitting: *Deleted

## 2012-08-02 ENCOUNTER — Ambulatory Visit (HOSPITAL_COMMUNITY)
Admission: RE | Admit: 2012-08-02 | Discharge: 2012-08-02 | Disposition: A | Discharge status: Home / Self Care | Payer: Medicaid Other | Source: Ambulatory Visit | Attending: Family Medicine | Admitting: Family Medicine

## 2012-08-02 DIAGNOSIS — R918 Other nonspecific abnormal finding of lung field: Secondary | ICD-10-CM | POA: Insufficient documentation

## 2012-08-02 DIAGNOSIS — R071 Chest pain on breathing: Secondary | ICD-10-CM | POA: Insufficient documentation

## 2012-08-02 DIAGNOSIS — R079 Chest pain, unspecified: Secondary | ICD-10-CM

## 2012-08-02 MED ORDER — IOHEXOL 350 MG/ML SOLN
80.0000 mL | Freq: Once | INTRAVENOUS | Status: AC | PRN
  Administered 2012-08-02: 80 mL via INTRAVENOUS

## 2012-08-02 NOTE — Telephone Encounter (Signed)
[facility] CT called to let MD know that patient's CT results were ready in EPIC.  I will fwd to MD for review.  Yasin Ducat, Darlyne Russian, CMA

## 2012-08-03 NOTE — Telephone Encounter (Signed)
Called and spoke to patient regarding CTA results.  She does not have a PE.  She continues to have the chest pain.  The CTA showed an infiltrate in the superior segment of the right lower lobe.  She will make an appointment to be seen in the next 1-2 weeks.  At that time, it may be beneficial to check a PPD.

## 2012-09-07 ENCOUNTER — Ambulatory Visit (INDEPENDENT_AMBULATORY_CARE_PROVIDER_SITE_OTHER): Payer: Medicaid Other | Admitting: Family Medicine

## 2012-09-07 ENCOUNTER — Encounter: Payer: Self-pay | Admitting: Family Medicine

## 2012-09-07 VITALS — BP 177/91 | HR 60 | Temp 98.2°F | Ht <= 58 in | Wt 127.0 lb

## 2012-09-07 DIAGNOSIS — E785 Hyperlipidemia, unspecified: Secondary | ICD-10-CM

## 2012-09-07 DIAGNOSIS — M25569 Pain in unspecified knee: Secondary | ICD-10-CM

## 2012-09-07 DIAGNOSIS — M25561 Pain in right knee: Secondary | ICD-10-CM

## 2012-09-07 DIAGNOSIS — I10 Essential (primary) hypertension: Secondary | ICD-10-CM

## 2012-09-07 MED ORDER — TRAMADOL HCL 50 MG PO TABS: 50.0000 mg | ORAL_TABLET | Freq: Three times a day (TID) | ORAL | Status: DC | PRN

## 2012-09-07 MED ORDER — ATORVASTATIN CALCIUM 20 MG PO TABS: 20.0000 mg | ORAL_TABLET | Freq: Every day | ORAL | Status: DC

## 2012-09-07 MED ORDER — FLUTICASONE PROPIONATE 50 MCG/ACT NA SUSP: 2.0000 | Freq: Every day | NASAL | Status: DC

## 2012-09-07 NOTE — Patient Instructions (Addendum)
Dear Sherry Fitzgerald, Thank you for coming in to clinic today. It was good to meet you!  Today we discussed your Right knee pain. 1. It looks like your knee is improving after your fall. I would recommend continuing your regular medications for arthritis and pain as needed. Keep wearing the brace to keep swelling down. You may use warm compresses and ice if they relieve your symptoms. Elevation at end of day to help reducing swelling as well. Continue to stay active with walking. 2. Your blood pressure is elevated today, and I would recommend checking it at home and coming in to get it checked in 2 weeks by the nurse. If it is still elevated we may need to add another medication.  Some important numbers from today's visit: BP - 177/91  Please schedule a follow-up appointment with the nurse in 2 weeks to check your BP.  If you have any other questions or concerns, please feel free to call the clinic to contact me. You may also schedule an earlier appointment if necessary.  However, if your symptoms get significantly worse, please go to the Emergency Department to seek immediate medical attention.  Saralyn Pilar, DO [facility]   Knee Exercises EXERCISES RANGE OF MOTION(ROM) AND STRETCHING EXERCISES These exercises may help you when beginning to rehabilitate your injury. Your symptoms may resolve with or without further involvement from your physician, physical therapist or athletic trainer. While completing these exercises, remember:   Restoring tissue flexibility helps normal motion to return to the joints. This allows healthier, less painful movement and activity.  An effective stretch should be held for at least 30 seconds.  A stretch should never be painful. You should only feel a gentle lengthening or release in the stretched tissue. STRETCH - Knee Extension, Prone  Lie on your stomach on a firm surface, such as a bed or countertop. Place your right / left knee  and leg just beyond the edge of the surface. You may wish to place a towel under the far end of your right / left thigh for comfort.  Relax your leg muscles and allow gravity to straighten your knee. Your clinician may advise you to add an ankle weight if more resistance is helpful for you.  You should feel a stretch in the back of your right / left knee. Hold this position for __________ seconds. Repeat __________ times. Complete this stretch __________ times per day. * Your physician, physical therapist or athletic trainer may ask you to add ankle weight to enhance your stretch.  RANGE OF MOTION - Knee Flexion, Active  Lie on your back with both knees straight. (If this causes back discomfort, bend your opposite knee, placing your foot flat on the floor.)  Slowly slide your heel back toward your buttocks until you feel a gentle stretch in the front of your knee or thigh.  Hold for __________ seconds. Slowly slide your heel back to the starting position. Repeat __________ times. Complete this exercise __________ times per day.  STRETCH - Quadriceps, Prone   Lie on your stomach on a firm surface, such as a bed or padded floor.  Bend your right / left knee and grasp your ankle. If you are unable to reach, your ankle or pant leg, use a belt around your foot to lengthen your reach.  Gently pull your heel toward your buttocks. Your knee should not slide out to the side. You should feel a stretch in the front of your  thigh and/or knee.  Hold this position for __________ seconds. Repeat __________ times. Complete this stretch __________ times per day.  STRETCH  Hamstrings, Supine   Lie on your back. Loop a belt or towel over the ball of your right / left foot.  Straighten your right / left knee and slowly pull on the belt to raise your leg. Do not allow the right / left knee to bend. Keep your opposite leg flat on the floor.  Raise the leg until you feel a gentle stretch behind your right /  left knee or thigh. Hold this position for __________ seconds. Repeat __________ times. Complete this stretch __________ times per day.  STRENGTHENING EXERCISES These exercises may help you when beginning to rehabilitate your injury. They may resolve your symptoms with or without further involvement from your physician, physical therapist or athletic trainer. While completing these exercises, remember:   Muscles can gain both the endurance and the strength needed for everyday activities through controlled exercises.  Complete these exercises as instructed by your physician, physical therapist or athletic trainer. Progress the resistance and repetitions only as guided.  You may experience muscle soreness or fatigue, but the pain or discomfort you are trying to eliminate should never worsen during these exercises. If this pain does worsen, stop and make certain you are following the directions exactly. If the pain is still present after adjustments, discontinue the exercise until you can discuss the trouble with your clinician. STRENGTH - Quadriceps, Isometrics  Lie on your back with your right / left leg extended and your opposite knee bent.  Gradually tense the muscles in the front of your right / left thigh. You should see either your knee cap slide up toward your hip or increased dimpling just above the knee. This motion will push the back of the knee down toward the floor/mat/bed on which you are lying.  Hold the muscle as tight as you can without increasing your pain for __________ seconds.  Relax the muscles slowly and completely in between each repetition. Repeat __________ times. Complete this exercise __________ times per day.  STRENGTH - Quadriceps, Short Arcs   Lie on your back. Place a __________ inch towel roll under your knee so that the knee slightly bends.  Raise only your lower leg by tightening the muscles in the front of your thigh. Do not allow your thigh to rise.  Hold  this position for __________ seconds. Repeat __________ times. Complete this exercise __________ times per day.  OPTIONAL ANKLE WEIGHTS: Begin with ____________________, but DO NOT exceed ____________________. Increase in 1 pound/0.5 kilogram increments.  STRENGTH - Quadriceps, Straight Leg Raises  Quality counts! Watch for signs that the quadriceps muscle is working to insure you are strengthening the correct muscles and not "cheating" by substituting with healthier muscles.  Lay on your back with your right / left leg extended and your opposite knee bent.  Tense the muscles in the front of your right / left thigh. You should see either your knee cap slide up or increased dimpling just above the knee. Your thigh may even quiver.  Tighten these muscles even more and raise your leg 4 to 6 inches off the floor. Hold for __________ seconds.  Keeping these muscles tense, lower your leg.  Relax the muscles slowly and completely in between each repetition. Repeat __________ times. Complete this exercise __________ times per day.  STRENGTH - Hamstring, Curls  Lay on your stomach with your legs extended. (If you lay on  a bed, your feet may hang over the edge.)  Tighten the muscles in the back of your thigh to bend your right / left knee up to 90 degrees. Keep your hips flat on the bed/floor.  Hold this position for __________ seconds.  Slowly lower your leg back to the starting position. Repeat __________ times. Complete this exercise __________ times per day.  OPTIONAL ANKLE WEIGHTS: Begin with ____________________, but DO NOT exceed ____________________. Increase in 1 pound/0.5 kilogram increments.  STRENGTH  Quadriceps, Squats  Stand in a door frame so that your feet and knees are in line with the frame.  Use your hands for balance, not support, on the frame.  Slowly lower your weight, bending at the hips and knees. Keep your lower legs upright so that they are parallel with the door  frame. Squat only within the range that does not increase your knee pain. Never let your hips drop below your knees.  Slowly return upright, pushing with your legs, not pulling with your hands. Repeat __________ times. Complete this exercise __________ times per day.  STRENGTH - Quadriceps, Wall Slides  Follow guidelines for form closely. Increased knee pain often results from poorly placed feet or knees.  Lean against a smooth wall or door and walk your feet out 18-24 inches. Place your feet hip-width apart.  Slowly slide down the wall or door until your knees bend __________ degrees.* Keep your knees over your heels, not your toes, and in line with your hips, not falling to either side.  Hold for __________ seconds. Stand up to rest for __________ seconds in between each repetition. Repeat __________ times. Complete this exercise __________ times per day. * Your physician, physical therapist or athletic trainer will alter this angle based on your symptoms and progress. Document Released: 11/05/2004 Document Revised: 03/16/2011 Document Reviewed: 04/05/2008 [facility] Patient Information 2014 [place], [place].

## 2012-09-07 NOTE — Assessment & Plan Note (Addendum)
Last cholesterol panel 05/2011, TC 313 / TG 108 / HDL 63 / LDL 228 ASCVD 71yr Risk (if using elevated BP 150-170) 9-11%. Recommend continuing moderate to high intensity statin.  Plan 1. Refilled Lipitor 20mg  daily

## 2012-09-07 NOTE — Assessment & Plan Note (Signed)
Elevated BP today 177/91. Reports good compliance with BP med, has taken today. No concerning symptoms. Meds - Lisinopril-HCTZ 20-12.5mg  (total 40-25) takes 2 tablets daily. Monitoring - Granddaughter is training to be nurse, checks BP at home multiple times weekly, last 100/60. Reportedly controlled at home.  Plan 1. No change to BP medication today. Continue to check BP at home. 2. Schedule patient for 2 week follow-up with nurse for BP check. If still elevated, would recommend adding Amlodipine 5mg  daily 3. Labs checked 06/2012, unremarkable with normal Creatinine and GFR.

## 2012-09-07 NOTE — Assessment & Plan Note (Signed)
Chronic hx (>15 yr) bilateral knee pain. Reports recent fall 1 week ago at home while carrying laundry. Mild skin scrape to bilateral knees, healing. Initially significant pain R > L (10/10) with some edema, which has since resolved. Pain improved to 5-7/10 (R knee) and Left Knee non-tender today. Exam - R-knee: no edema, erythema, mild TTP medial/lateral joint line, full active ROM, ligaments / special testing intact, gait normal, strength 5/5 Therapy - continues Mobic 15mg  daily and Tramadol 50mg  q 8 PRN (takes 1-3 tab daily) Exercise - walks 7-8 miles daily, very active.  Plan: 1. Currently improving. Continue same pain regimen. No need to increase today. 2. Advised continue to wear R-knee brace to reduce swelling, especially with and after exercise. 3. Reviewed X-rays (2013), minimal arthritis.

## 2012-09-07 NOTE — Progress Notes (Signed)
Subjective:     Patient ID: Sherry Fitzgerald, female   DOB: 1945-09-27, 67 y.o.   MRN: [id number]  HPI  KNEE PAIN, RIGHT Hx chronic bilateral knee pain (R > L) for past 15 years. Recent injury - Fall 1 week ago, at home, while carrying laundry, hit R knee with minor abrasions, healing well, increased pain 10/10 and some swelling, which has since improved. Wore a brace on R knee with improvement in swelling. Normal gait w/o complaints of pain. Therapy - continues to take Mobic 15mg  daily (recently started for chest wall muscle strain) and Tramadol 50mg  q 8 (takes 1-3 daily) Imaging - Last X-rays 2013, showed minimal degenerative changes Exercise - very active, walks 7-8 miles daily   CHRONIC HYPERTENSION Disease Monitoring  Blood pressure today - 177/91 - (Granddaughter checks at home multiple times weekly, reportedly controlled 100-120s / 60-70s  Chest pain: no   Dyspnea: no   Claudication: no  Medication compliance: yes - Lisinopril-HCTZ 20-12.5mg  takes 2 tablets daily Medication Side Effects  Lightheadedness: no   Urinary frequency: no   Edema: no  Preventitive Healthcare:  Exercise: yes - walks 7-8 miles daily  Hyperlipidemia: Reported good compliance with Lipitor 20mg  daily. Needs refill. Tolerating without side effects, muscle aches, or pains. Last lipid panel 2013 - TC 313 / TG 108 / HDL 63 / LDL 228  ALLERGIC RHINITIS: Reports needing refill on nose spray (Fluticasone), which has improved cough and dry throat in past.  Social Hx: never smoker   Review of Systems  See above HPI.    Objective:   Physical Exam  BP 177/91  Pulse 60  Temp(Src) 98.2 F (36.8 C) (Oral)  Ht 4' 9.5" (1.461 m)  Wt 127 lb (57.607 kg)  BMI 26.99 kg/m2  General - pleasant, conversational, NAD Neck - supple, non-tender, no LAD Heart - RRR, no murmurs Lungs - CTAB Ext - no edema, non-tender, no erythema MSK: R-knee: no edema, erythema, mild TTP medial/lateral joint line, full active ROM,  ligaments / special testing intact, gait normal, strength 5/5 Neuro - grossly intact and non-focal, intact distal sensation to light touch and normal muscle str 5/5, gait normal

## 2012-09-21 ENCOUNTER — Other Ambulatory Visit: Payer: Self-pay | Admitting: Family Medicine

## 2012-09-21 ENCOUNTER — Ambulatory Visit (INDEPENDENT_AMBULATORY_CARE_PROVIDER_SITE_OTHER): Payer: Medicaid Other | Admitting: *Deleted

## 2012-09-21 VITALS — BP 181/85 | HR 60

## 2012-09-21 DIAGNOSIS — I10 Essential (primary) hypertension: Secondary | ICD-10-CM

## 2012-09-21 MED ORDER — AMLODIPINE BESYLATE 5 MG PO TABS: 5.0000 mg | ORAL_TABLET | Freq: Every day | ORAL | Status: DC

## 2012-09-21 NOTE — Progress Notes (Signed)
Patient here today for BP check with nurse.  BP--181/85, P--60.  Discussed with Dr. Althea Charon.  Will have patient start on amlodipine 5 mg daily and will send Rx to pharmacy.  Patient to return for BP f/u with MD in 2 weeks.  Patient agreeable and verbalized understanding.   Gaylene Brooks, RN

## 2012-09-21 NOTE — Progress Notes (Signed)
Patient here today for BP check with nurse.  BP today 181/85 & P--60.  Discussed with Dr. Althea Charon.  Will have patient start on amlodipine 5 mg daily and have patient return for follow-up appt in 2 weeks with Dr. Althea Charon.

## 2012-10-17 ENCOUNTER — Ambulatory Visit (INDEPENDENT_AMBULATORY_CARE_PROVIDER_SITE_OTHER): Payer: Medicaid Other | Admitting: Family Medicine

## 2012-10-17 ENCOUNTER — Encounter: Payer: Self-pay | Admitting: Family Medicine

## 2012-10-17 VITALS — BP 157/68 | HR 57 | Temp 98.0°F | Wt 128.0 lb

## 2012-10-17 DIAGNOSIS — E785 Hyperlipidemia, unspecified: Secondary | ICD-10-CM

## 2012-10-17 DIAGNOSIS — M25561 Pain in right knee: Secondary | ICD-10-CM

## 2012-10-17 DIAGNOSIS — M25569 Pain in unspecified knee: Secondary | ICD-10-CM

## 2012-10-17 DIAGNOSIS — I10 Essential (primary) hypertension: Secondary | ICD-10-CM

## 2012-10-17 MED ORDER — ATORVASTATIN CALCIUM 20 MG PO TABS: 20.0000 mg | ORAL_TABLET | Freq: Every day | ORAL | Status: DC

## 2012-10-17 NOTE — Assessment & Plan Note (Addendum)
No knee pain today, 0/10. Only report occasional R-knee instability. Rarely taking pain medications Mobic / Tramadol. (< 1x weekly) Continues to walk >6 miles most days.  Plan: 1. No changes at this time. Continue walking, wearing knee braces. 2. Discuss DC Mobic completely, due to inc BP, since not taking regularly.

## 2012-10-17 NOTE — Assessment & Plan Note (Signed)
Improved control HTN, BP today still elevated 157/68. Re-checked at 165/80  Meds - Amlodipine 5mg  daily, Lisinopril-HCTZ (total 40-25mg ) daily No complications   Plan:  1. Due to improved control, continue current BP meds.  2. Advised to check BP at home / pharmacy 2x weekly for the next month. Record readings and bring to clinic. 3. Continue lifestyle - walking, diet  4. RTC in 1 month to re-check BP, or sooner if readings elevated.  Future considerations: 1. If BP remains elevated in 1 month, (>150/90) plan to inc Amlodipine 5 to 10mg  daily. 2. Discuss DC Mobic due to elevation in BP. 2. If persistent elevation, consider ambulatory BP monitoring to determine if [place] HTN.

## 2012-10-17 NOTE — Assessment & Plan Note (Signed)
Has not been able to get Lipitor 20mg  filled due to problem with pharmacy.  Plan: 1. Re-ordered Rx Lipitor 20mg 

## 2012-10-17 NOTE — Progress Notes (Signed)
Subjective:     Patient ID: Sherry Fitzgerald, female   DOB: September 30, 1945, 67 y.o.   MRN: [id number]  HPI  CHRONIC HTN: Elevated BP today, 157/68, which is improved from last visit after starting Amlodipine 5mg  daily (previous BPs 181/85, 177/91). Reports occasional outside BP monitoring at drug store, values 120s systolic. Reports - no concerns or complaints. Current Meds - Amlodipine 5mg  daily, Lisinopril-HCTZ 40-25mg  daily Reports good compliance, took meds today. Tolerating well, w/o complaints. Lifestyle - Exercises (walks about 6 miles most days, meets friend 4:30am to walk), continues with healthy diet (inc fruit/vegetables, reduced meat, no added salt, decrease portion sizes) Denies CP, dyspnea, claudication, HA, edema, dizziness / lightheadedness  HYPERLIPIDEMIA: Reports that the pharmacy did not have her Rx for Lipitor, and was unable to fill it last time. Requested refill, and plans to take regularly.  CHRONIC KNEE PAIN, Bilateral: Reports that her knees have been doing very well, and the pain has been controlled. Today 0/10 pain, no swelling, or difficulty ambulating. She reports sensation that her Right knee will "give out" occasionally, which has caused her to fall in the past. Continues to walk >6 miles for exercise, wears knee braces while exercising. Current Meds - Mobic 15mg  daily and Tramadol 50mg  TID PRN (rarely takes both, only needed x1 per week or less)  Social hx: Never smoker. 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 Immunizations - Declined influenza vaccine today.  Review of Systems  See above HPI. Otherwise, ROS negative. Denies fever, chills, abd pain, n/v/c/d, muscle pains, numbness or tingling.     Objective:   Physical Exam  BP 157/68  Pulse 57  Temp(Src) 98 F (36.7 C) (Oral)  Wt 128 lb (58.06 kg)  BMI 27.2 kg/m2  General - pleasant, well-appearing, NAD  HEENT - PERRLA, EOMI, patent  nares w/o congestion, pharynx clear Neck - supple, non-tender, no LAD  Heart - RRR, no murmurs  Lungs - CTAB  Ext - no edema and non-tender, +2 peripheral pulses b/l MSK: R-knee: non-tender to palpation, no edema or erythema, full AROM, ligaments / special testing intact, gait normal, strength 5/5  Neuro - grossly intact and non-focal, intact distal sensation to light touch and normal muscle str 5/5, gait normal     Assessment:     See detailed A&P under Problem List.    Plan:     See detailed A&P under Problem List.

## 2012-10-17 NOTE — Patient Instructions (Addendum)
Dear Sherry Fitzgerald, Thank you for coming in to clinic today. It was good to see you again! Congratulations on your newest grandson!  Today we discussed your Blood Pressure. 1. Your pressure has definitely improved since we added the new medication (Amlodipine) about 1 month ago. 2. No changes to your medications today. As we discussed, at your next visit if your pressure is elevated we may increase your dose. 3. Keep up the great work with your walking exercise and healthy diet habits! 4. Please check your blood pressure at home or at a pharmacy or [facility] about 2 to 3 times a week for the next few weeks, and write down those numbers, so you can bring them in next time I see you. 5. If your pressure remains high, we may try to do a 24 hour blood pressure monitoring for you, to see if it is only high at the clinic. 6. Consider the flu shot at your next visit. It should help prevent getting the flu so you can stay healthy this year!  Some important numbers from today's visit: BP - 157/68  Please schedule a follow-up appointment with me in 2 - 3 weeks. To check your BP.  If you have any other questions or concerns, please feel free to call the clinic to contact me. You may also schedule an earlier appointment if necessary.  However, if your symptoms get significantly worse, please go to the Emergency Department to seek immediate medical attention.  Saralyn Pilar, DO [facility]

## 2012-11-15 ENCOUNTER — Ambulatory Visit: Payer: Medicaid Other | Admitting: Family Medicine

## 2012-11-28 ENCOUNTER — Ambulatory Visit (INDEPENDENT_AMBULATORY_CARE_PROVIDER_SITE_OTHER): Payer: Medicaid Other | Admitting: Family Medicine

## 2012-11-28 ENCOUNTER — Encounter: Payer: Self-pay | Admitting: Family Medicine

## 2012-11-28 VITALS — BP 176/84 | HR 60 | Ht <= 58 in | Wt 126.0 lb

## 2012-11-28 DIAGNOSIS — I10 Essential (primary) hypertension: Secondary | ICD-10-CM

## 2012-11-28 NOTE — Assessment & Plan Note (Addendum)
Worsened HTN control (previously improved), today BP elevated 176/85. Re-checked at 176/84. Meds - Amlodipine 5mg  daily, Lisinopril-HCTZ (total 40-25mg ) daily - (did not take either med today, ran out, need to pickup refills) No complications or symptoms  Plan:  1. Continue current BP meds - suspect elevated BP due to non-adherence to meds x 2-3 days 2. Advised to check BP at home / pharmacy 2x weekly for the next month. 3. Continue lifestyle - walking, diet  4. RTC in 1-2 months to re-evaluate BP.  Follow-up Plan (next BP check 2 weeks): 1. If BP remains elevated (>160/90) (and pt is compliant with BP meds, including taken same day), plan to inc Amlodipine 5 to 10mg  daily.  2. Additionally, if elevated at next re-check. Please refer to Pharmacy Clinic for 24 hr BP monitoring, review of meds / compliance

## 2012-11-28 NOTE — Progress Notes (Signed)
Subjective:     Patient ID: Sherry Fitzgerald, female   DOB: 19-Aug-1945, 67 y.o.   MRN: [id number]  HPI  CHRONIC HTN: Elevated BP today, 176/85. Reports - no complaints. Ran out of both BP meds (Amlodipine, Lisinopril-HCTZ) x 2-3 days, has been called by [facility] pharmacy to pick up but has not yet. Acknowledges she will go today to pick-up meds. Reports occasional BP checks outside of clinic and at home, at 130-140s systolic. Current Meds - Amlodipine 5mg  daily, Lisinopril-HCTZ 20-12.5mg  x 2 tablets daily   Reports good compliance normally, but did not take meds today (ran out, need to pick-up refills). Tolerating meds well, w/o complaints. Lifestyle - Exercise (walks daily, several miles), healthy diet (inc fruit/vege, reduced meat, no salt) Denies CP, dyspnea, claudication, HA, edema, dizziness / lightheadedness  Social hx: Never smoker  Immunizations - Declined influenza vaccine today.  Review of Systems  See above HPI. Otherwise, ROS negative. Denies fever, chills, abd pain, n/v/c/d, muscle or joint pains, numbness or tingling. Denies active knee pain.     Objective:   Physical Exam  BP 176/84  Pulse 60  Ht 4' 9.5" (1.461 m)  Wt 126 lb (57.153 kg)  BMI 26.78 kg/m2  General - pleasant, well-appearing, NAD  HEENT - PERRLA, EOMI, pharynx clear, MMM  Neck - supple, non-tender, no LAD  Heart - RRR, no murmurs  Lungs - CTAB  Ext - no edema and non-tender, +2 peripheral pulses b/l  Neuro - grossly intact and non-focal, intact distal sensation to light touch and normal muscle str 5/5, gait normal     Assessment:     See specific A&P problem list for details.      Plan:     See specific A&P problem list for details.

## 2012-11-28 NOTE — Patient Instructions (Signed)
Dear Sherry Fitzgerald,  Thank you for coming in to clinic today. It was good to see you again!  Today we discussed your Blood Pressure.  1. I believe that your blood pressure is elevated since you have been out of both of your BP meds for 2-3 days. Your pressure had been better controlled before when you were taking them, so we know that they work. Please do your best to take them every day. 2. Make sure that you pick up your BP meds from the pharmacy today and take them. If you ever run out or have any problems, please call the clinic and we will make sure that you can get your medicines. 2. No changes to your medications today. As we discussed, at your next visit if your pressure is elevated we may increase your dose.  3. Keep up the great work with your walking exercise and healthy diet habits!  4. Please check your blood pressure at home or at a pharmacy or [facility] about 2 to 3 times a week for the next few weeks, and write down those numbers, so you can bring them in next time I see you.  5. If your pressure remains high, we may try to do a 24 hour blood pressure monitoring for you, to see if it is only high at the clinic.  6. Consider the flu shot at your next visit. It should help prevent getting the flu so you can stay healthy this year!  Some important numbers from today's visit:  BP - 176/85, re-checked about the same level  Please schedule a Nurse only BP check visit in 2 weeks.  Please schedule a follow-up appointment with me in 1 to 2 months, follow-up BP.   If you have any other questions or concerns, please feel free to call the clinic to contact me. You may also schedule an earlier appointment if necessary.  However, if your symptoms get significantly worse, please go to the Emergency Department to seek immediate medical attention.   Saralyn Pilar, DO  [facility]

## 2012-12-09 ENCOUNTER — Ambulatory Visit (INDEPENDENT_AMBULATORY_CARE_PROVIDER_SITE_OTHER): Payer: Medicaid Other | Admitting: *Deleted

## 2012-12-09 VITALS — BP 156/78

## 2012-12-09 DIAGNOSIS — I10 Essential (primary) hypertension: Secondary | ICD-10-CM

## 2012-12-09 NOTE — Progress Notes (Signed)
Patient in today for blood pressure check. Blood pressure taken in right arm with manual cuff was 156/78. Patient reports regular taking of blood pressure medications sine picking them up last week. Will forward not to PCP.

## 2013-05-16 ENCOUNTER — Telehealth: Payer: Self-pay | Admitting: Family Medicine

## 2013-05-16 ENCOUNTER — Ambulatory Visit (INDEPENDENT_AMBULATORY_CARE_PROVIDER_SITE_OTHER): Payer: PRIVATE HEALTH INSURANCE | Admitting: Family Medicine

## 2013-05-16 ENCOUNTER — Encounter: Payer: Self-pay | Admitting: Family Medicine

## 2013-05-16 VITALS — BP 187/81 | HR 51 | Temp 97.9°F | Ht <= 58 in | Wt 119.0 lb

## 2013-05-16 DIAGNOSIS — I10 Essential (primary) hypertension: Secondary | ICD-10-CM | POA: Diagnosis not present

## 2013-05-16 DIAGNOSIS — R059 Cough, unspecified: Secondary | ICD-10-CM

## 2013-05-16 DIAGNOSIS — E785 Hyperlipidemia, unspecified: Secondary | ICD-10-CM | POA: Diagnosis not present

## 2013-05-16 DIAGNOSIS — R05 Cough: Secondary | ICD-10-CM | POA: Diagnosis not present

## 2013-05-16 MED ORDER — ATORVASTATIN CALCIUM 20 MG PO TABS: 20.0000 mg | ORAL_TABLET | Freq: Every day | ORAL | Status: DC

## 2013-05-16 NOTE — Assessment & Plan Note (Signed)
Patient presents for evaluation of cough that is likely viral in nature.  -conservative measure addressed

## 2013-05-16 NOTE — Telephone Encounter (Signed)
After the patient left the office I noticed that her blood pressure was elevated, I called the patient to make her aware, she reports compliance with her medications, no headaches, no vision changes, no chest pain. Patient to make appointment in one week for follow up.

## 2013-05-16 NOTE — Assessment & Plan Note (Signed)
Blood Pressure Elevated today. Patient notified by telephone as not noted during office visit. No systemic signs of accelerated HTN noted -Patient to continue current blood pressure regimen and call for follow up next week -Return precautions given

## 2013-05-16 NOTE — Patient Instructions (Signed)
Viral Infections °A virus is a type of germ. Viruses can cause: °· Minor sore throats. °· Aches and pains. °· Headaches. °· Runny nose. °· Rashes. °· Watery eyes. °· Tiredness. °· Coughs. °· Loss of appetite. °· Feeling sick to your stomach (nausea). °· Throwing up (vomiting). °· Watery poop (diarrhea). °HOME CARE  °· Only take medicines as told by your doctor. °· Drink enough water and fluids to keep your pee (urine) clear or pale yellow. Sports drinks are a good choice. °· Get plenty of rest and eat healthy. Soups and broths with crackers or rice are fine. °GET HELP RIGHT AWAY IF:  °· You have a very bad headache. °· You have shortness of breath. °· You have chest pain or neck pain. °· You have an unusual rash. °· You cannot stop throwing up. °· You have watery poop that does not stop. °· You cannot keep fluids down. °· You or your child has a temperature by mouth above 102° F (38.9° C), not controlled by medicine. °· Your baby is older than 3 months with a rectal temperature of 102° F (38.9° C) or higher. °· Your baby is 3 months old or younger with a rectal temperature of 100.4° F (38° C) or higher. °MAKE SURE YOU:  °· Understand these instructions. °· Will watch this condition. °· Will get help right away if you are not doing well or get worse. °Document Released: 12/05/2007 Document Revised: 03/16/2011 Document Reviewed: 04/29/2010 °ExitCare® Patient Information ©2014 [employer]. ° °

## 2013-05-16 NOTE — Progress Notes (Signed)
   Subjective:    Patient ID: Sherry Fitzgerald, female    DOB: [birth year], 68 y.o.   MRN: [id number]  HPI 68 y/o female presents for two week history of cough, she has associated sore throat and rhinorrhea at the beginning the illness, these have resolved however she continues to have non-productive cough, no fevers or chills, she did have a flight to [place] during the start of her symptoms, she reports to associated calf pain/leg swelling/sob/pleuritic chest pain, she has had right sided chest pain only with cough, no emesis, no nausea, no changes in bowel habits.    Review of Systems  Constitutional: Negative for fever, chills and fatigue.  Respiratory: Positive for cough. Negative for shortness of breath and stridor.   Cardiovascular: Negative for chest pain, palpitations and leg swelling.  Gastrointestinal: Negative for nausea, diarrhea and constipation.       Objective:   Physical Exam Vitals: reviewed, no hypoxia Gen: pleasant female, NAD HEENT: normocephalic, PERRL, EOMI, no scleral icterus, nasal septum midline, MMM, uvula midline, neck supple, no anterior or posterior cervical lymphadenopathy Cardiac: RRR, S1 and S2, no murmurs, no heaves/thrills Resp: CTAB, normal effort Ext: trace bilateral LE edema, no calf tenderness, negative Homan's sign bilaterally     Assessment & Plan:  Please see problem specific assessment and plan.

## 2013-06-06 ENCOUNTER — Other Ambulatory Visit: Payer: Self-pay | Admitting: *Deleted

## 2013-06-06 MED ORDER — TRAMADOL HCL 50 MG PO TABS: 50.0000 mg | ORAL_TABLET | Freq: Three times a day (TID) | ORAL | Status: DC | PRN

## 2014-02-23 ENCOUNTER — Encounter: Payer: Self-pay | Admitting: Family Medicine

## 2014-07-30 ENCOUNTER — Ambulatory Visit (INDEPENDENT_AMBULATORY_CARE_PROVIDER_SITE_OTHER): Payer: Medicare Other | Admitting: Family Medicine

## 2014-07-30 ENCOUNTER — Encounter: Payer: Self-pay | Admitting: Family Medicine

## 2014-07-30 VITALS — BP 176/62 | HR 61 | Temp 98.4°F | Wt 122.0 lb

## 2014-07-30 DIAGNOSIS — I10 Essential (primary) hypertension: Secondary | ICD-10-CM

## 2014-07-30 DIAGNOSIS — M25561 Pain in right knee: Secondary | ICD-10-CM

## 2014-07-30 DIAGNOSIS — Z13820 Encounter for screening for osteoporosis: Secondary | ICD-10-CM

## 2014-07-30 DIAGNOSIS — M25562 Pain in left knee: Secondary | ICD-10-CM

## 2014-07-30 DIAGNOSIS — Z23 Encounter for immunization: Secondary | ICD-10-CM

## 2014-07-30 DIAGNOSIS — E785 Hyperlipidemia, unspecified: Secondary | ICD-10-CM

## 2014-07-30 DIAGNOSIS — Z0000 Encounter for general adult medical examination without abnormal findings: Secondary | ICD-10-CM

## 2014-07-30 LAB — BASIC METABOLIC PANEL WITH GFR
BUN: 13 mg/dL (ref 7–25)
CHLORIDE: 104 mmol/L (ref 98–110)
CO2: 31 mmol/L (ref 20–31)
CREATININE: 0.77 mg/dL (ref 0.50–0.99)
Calcium: 9.3 mg/dL (ref 8.6–10.4)
GFR, EST NON AFRICAN AMERICAN: 81 mL/min (ref >=60)
GFR, Est African American: >89 mL/min (ref >=60)
GLUCOSE: 66 mg/dL (ref 65–99)
POTASSIUM: 4.4 mmol/L (ref 3.5–5.3)
SODIUM: 141 mmol/L (ref 135–146)

## 2014-07-30 LAB — LDL CHOLESTEROL, DIRECT: Direct LDL: 191 mg/dL — ABNORMAL HIGH (ref <130)

## 2014-07-30 MED ORDER — ATORVASTATIN CALCIUM 20 MG PO TABS: 20.0000 mg | ORAL_TABLET | Freq: Every day | ORAL | Status: DC

## 2014-07-30 MED ORDER — AMLODIPINE BESYLATE 5 MG PO TABS: 5.0000 mg | ORAL_TABLET | Freq: Every day | ORAL | Status: DC

## 2014-07-30 MED ORDER — LISINOPRIL-HYDROCHLOROTHIAZIDE 20-12.5 MG PO TABS: 2.0000 | ORAL_TABLET | Freq: Every day | ORAL | Status: DC

## 2014-07-30 NOTE — Patient Instructions (Addendum)
Thank you for coming in to clinic today. It was good to see you again!  1. For your Blood Pressure - elevated today, likely since you didn't take medicine today and with reported stressors - Sent 1 year of refills for both blood pressure meds - Take Amlodipine  - one pill daily, given 90 day supply with refills - Take Lisinopril-HCTZ - take 2 pills daily, given 30 day supply (with 60 pills, and 11 refills for 1 year) 2. Refilled Atorvastatin for cholesterol 3. Keep up good work with your walking every day, continue low salt diet and eat healthy  Ordered basic blood work and cholesterol, will mail results to you this week  Tetanus / TDap shot today  Pneumonia shot today  You are done with colonoscopy  Please call the [facility] to schedule your Mammogram soon at your convenience. [address], [place], [place] [address] [phone]  Please schedule a follow-up appointment with Dr. Althea Charon in 6 to 12 months for annual exam.  If you have any other questions or concerns, please feel free to call the clinic to contact me. You may also schedule an earlier appointment if necessary.  However, if your symptoms get significantly worse, please go to the Emergency Department to seek immediate medical attention.  Saralyn Pilar, DO [facility]

## 2014-07-30 NOTE — Assessment & Plan Note (Addendum)
Known HLD with elevated LDL, last lipid panel 2013 with LDL >200 - Previously tolerating atorva  qd, out for at least 1 mo  Plan: 1. Refill atorvastatin  qd x 1 year supply 2. Check direct LDL (not fasting today) 3. Continue exercise

## 2014-07-30 NOTE — Progress Notes (Signed)
   Subjective:    Patient ID: Sherry Fitzgerald, female    DOB: Apr 11, 1945, 69 y.o.   MRN: [id number]  HPI  CHRONIC HTN: Reports - chronic labile BP, recent life stressors with sister passing and other family members sick. Does report outside BP checks at home ranging from SBP 130 to 160. Current Meds - Amlodipine  daily, Lisinopril-HCTZ 20-12.5mg  x 2 tabs daily   Reports good compliance, did NOT take meds today. Tolerating well, w/o complaints. Lifestyle - daily exercise with walking up to 3 hours daily, very active without difficulties. Tries to eat healthy low salt diet, vegetables Denies CP, dyspnea, HA, edema, dizziness / lightheadedness  HLD: - Last Lipid panel 05/2011 with elevated LDL >200 with elevated Total Cholesterol. Otherwise normal HDL - Previously taking atorvastatin  daily, out for at least 1 month, requesting refill. Tolerating previously without myalgias  CHRONIC KNEE PAIN: - Known osteoarthritis, previous bilateral knee pain >15 years. No surgery. Last x-rays 2013 with mild DJD - Currently asymptomatic, without pain. Tolerating extensive walking regimen daily up to 3 hours without difficulty - Not taking Mobic, Tramadol, or Gabapentin at this time, all PRN still has and not needing - No assistance devices needed - Denies any knee or other joint swelling, pain, redness  HM: - Request Mammogram - Reported completed Colonoscopy 2015 in [place], reported normal without any polyps -   I have reviewed and updated the following as appropriate: allergies and current medications  Social Hx: - Never smoker. Denies alcohol or other drugs  Review of Systems  See above HPI    Objective:   Physical Exam  BP 176/62 mmHg  Pulse 61  Temp(Src) 98.4 F (36.9 C) (Oral)  Wt 122 lb (55.339 kg)  Manual re-check BP Right arm at 168/70  Gen - well-appearing, comfortable, cooperative, NAD HEENT - PERRL, EOMI, oropharynx clear, MMM Neck - supple, non-tender, no LAD, no  thyromegaly Heart - RRR, no murmurs heard Lungs - CTAB, no wheezing, crackles, or rhonchi. Non-labored. Good air movement. Ext - non-tender, no edema, peripheral pulses intact +2 b/l Skin - warm, dry, no rashes Neuro - awake, alert, oriented x3, grossly non-focal, intact muscle strength 5/5 b/l grip, knee flex, ankle dorsiflex, intact distal sensation to light touch, gait normal     Assessment & Plan:   See specific A&P problem list for details.

## 2014-07-30 NOTE — Assessment & Plan Note (Signed)
-   Ordered DEXA for osteoporosis screening, no prior documented, no fractures - Handout given to schedule Mammogram - Last colonoscopy 2005, normal good for 10 yr, however reported had in [place] 11/2013, normal without polyps, prior to returning to [place] - Received TDap and PNA vaccine today - Declined zostavax

## 2014-07-30 NOTE — Assessment & Plan Note (Signed)
Stable. No knee pain today. Chronic known OA bilateral with knee pain, R>L. Not taking PRN pain meds (has Mobic, Gabapentin, Tramadol) Active with walking >3 hrs daily without limitation, no assisted devices  Plan: 1. Continue walking, use knee sleeves/braces 2. Advised try Tylenol PRN for pain, ice elevation, may try Tramadol, otherwise would advise against taking Mobic PRN. May try Gabapentin (although this was likely more for radicular pain previously)

## 2014-07-30 NOTE — Assessment & Plan Note (Addendum)
Poorly controlled BP, history of labile BP. Above goal >150/90 for age 69 yr. Manual re-check, remains elevated at 168/70. Asymptomatic. Likely related to some questionable med adherence, did not take today since going to doctor's office. Otherwise doing well with reported walking >3 hours daily  No complications  Plan:  1. Refilled Amlodipine  (#90, for 3 months) daily and Lisinopril 20-12.5mg  x 2 tabs daily (#60 for monthly) - both for 1 year supply with refills  2. Check BMET, follow serum Cr (last 0.75 in 2014) 3. Lifestyle Mods - Continue great job walking exercise, Dec salt intake, inc K+ rich vegs 4. Monitor BP at home or at drug store occasionally 5. Future consider pharmacy clinic for ambulatory BP monitoring. Consider increase Amlodipine to , caution with BB given HR 60s

## 2014-07-31 ENCOUNTER — Encounter: Payer: Self-pay | Admitting: Family Medicine

## 2014-07-31 ENCOUNTER — Telehealth: Payer: Self-pay | Admitting: Family Medicine

## 2014-07-31 DIAGNOSIS — E785 Hyperlipidemia, unspecified: Secondary | ICD-10-CM

## 2014-07-31 NOTE — Telephone Encounter (Addendum)
Reviewed chart. Last OV 07/30/14 for routine follow-up for HTN, HLD. Labs collected including BMET and Direct LDL (non-fasting). Results with BMET unremarkable, SCr 0.77, stable without any evidence of CKD in setting of HTN. Direct LDL elevated at 191, previously last lipid panel 2013 with LDL 228 and elevated TC >300. Patient has not been compliant with atorvastatin  daily (off for unclear period of time, ran out few months), agreeable to resuming this rx after refill sent in.  Called patient reviewed these above results, advised that her cholesterol is still significantly elevated, need to start taking atorvastatin  daily as prescribed for a 3 month trial, then will re-check fasting lipid panel to see if improvement. If continues elevated LDL or < 50% reduction would consider increasing dose to  daily.  Ordered future lab for fasting lipid panel, advised patient to schedule a [facility] Lab Only appointment in 3 months, I will review results and discuss follow-up at that time.  Letter with above lab results and recommendations to be mailed to patient this week.  Saralyn Pilar, DO [facility], PGY-3

## 2014-08-29 ENCOUNTER — Other Ambulatory Visit: Payer: Self-pay | Admitting: *Deleted

## 2014-08-29 DIAGNOSIS — E785 Hyperlipidemia, unspecified: Secondary | ICD-10-CM

## 2014-08-29 DIAGNOSIS — I10 Essential (primary) hypertension: Secondary | ICD-10-CM

## 2014-08-30 MED ORDER — ATORVASTATIN CALCIUM 20 MG PO TABS: 20.0000 mg | ORAL_TABLET | Freq: Every day | ORAL | Status: DC

## 2014-08-30 MED ORDER — LISINOPRIL-HYDROCHLOROTHIAZIDE 20-12.5 MG PO TABS: 2.0000 | ORAL_TABLET | Freq: Every day | ORAL | Status: DC

## 2017-08-25 ENCOUNTER — Emergency Department (HOSPITAL_COMMUNITY)
Admission: EM | Admit: 2017-08-25 | Discharge: 2017-08-26 | Disposition: A | Discharge status: Home / Self Care | Payer: Medicare Other | Attending: Emergency Medicine | Admitting: Emergency Medicine

## 2017-08-25 ENCOUNTER — Encounter (HOSPITAL_COMMUNITY): Payer: Self-pay | Admitting: *Deleted

## 2017-08-25 ENCOUNTER — Other Ambulatory Visit: Payer: Self-pay

## 2017-08-25 ENCOUNTER — Emergency Department (HOSPITAL_COMMUNITY): Payer: Medicare Other

## 2017-08-25 DIAGNOSIS — R112 Nausea with vomiting, unspecified: Secondary | ICD-10-CM | POA: Diagnosis not present

## 2017-08-25 DIAGNOSIS — H538 Other visual disturbances: Secondary | ICD-10-CM | POA: Diagnosis not present

## 2017-08-25 DIAGNOSIS — R001 Bradycardia, unspecified: Secondary | ICD-10-CM | POA: Diagnosis not present

## 2017-08-25 DIAGNOSIS — R42 Dizziness and giddiness: Secondary | ICD-10-CM | POA: Insufficient documentation

## 2017-08-25 HISTORY — DX: Essential (primary) hypertension: I10

## 2017-08-25 LAB — CBC WITH DIFFERENTIAL/PLATELET
Abs Immature Granulocytes: 0 10*3/uL (ref 0.0–0.1)
BASOS ABS: 0.1 10*3/uL (ref 0.0–0.1)
BASOS PCT: 1 %
EOS PCT: 1 %
Eosinophils Absolute: 0.1 10*3/uL (ref 0.0–0.7)
HCT: 40.4 % (ref 36.0–46.0)
Hemoglobin: 12 g/dL (ref 12.0–15.0)
Immature Granulocytes: 0 %
Lymphocytes Relative: 17 %
Lymphs Abs: 1.6 10*3/uL (ref 0.7–4.0)
MCH: 22.4 pg — AB (ref 26.0–34.0)
MCHC: 29.7 g/dL — AB (ref 30.0–36.0)
MCV: 75.5 fL — ABNORMAL LOW (ref 78.0–100.0)
MONO ABS: 0.5 10*3/uL (ref 0.1–1.0)
Monocytes Relative: 6 %
Neutro Abs: 7.1 10*3/uL (ref 1.7–7.7)
Neutrophils Relative %: 75 %
PLATELETS: 241 10*3/uL (ref 150–400)
RBC: 5.35 MIL/uL — ABNORMAL HIGH (ref 3.87–5.11)
RDW: 13.6 % (ref 11.5–15.5)
WBC: 9.4 10*3/uL (ref 4.0–10.5)

## 2017-08-25 LAB — URINALYSIS, ROUTINE W REFLEX MICROSCOPIC
BILIRUBIN URINE: NEGATIVE
Glucose, UA: NEGATIVE mg/dL
Hgb urine dipstick: NEGATIVE
Ketones, ur: 5 mg/dL — AB
LEUKOCYTES UA: NEGATIVE
NITRITE: NEGATIVE
Protein, ur: NEGATIVE mg/dL
SPECIFIC GRAVITY, URINE: 1.004 — AB (ref 1.005–1.030)
pH: 5 (ref 5.0–8.0)

## 2017-08-25 LAB — COMPREHENSIVE METABOLIC PANEL
ALK PHOS: 60 U/L (ref 38–126)
ALT: 19 U/L (ref 0–44)
ANION GAP: 10 (ref 5–15)
AST: 24 U/L (ref 15–41)
Albumin: 3.8 g/dL (ref 3.5–5.0)
BUN: 12 mg/dL (ref 8–23)
CALCIUM: 9.1 mg/dL (ref 8.9–10.3)
CHLORIDE: 101 mmol/L (ref 98–111)
CO2: 26 mmol/L (ref 22–32)
Creatinine, Ser: 0.84 mg/dL (ref 0.44–1.00)
GFR calc non Af Amer: >60 mL/min (ref >60)
Glucose, Bld: 142 mg/dL — ABNORMAL HIGH (ref 70–99)
POTASSIUM: 4.5 mmol/L (ref 3.5–5.1)
SODIUM: 137 mmol/L (ref 135–145)
Total Bilirubin: 0.7 mg/dL (ref 0.3–1.2)
Total Protein: 7.4 g/dL (ref 6.5–8.1)

## 2017-08-25 LAB — LIPASE, BLOOD: Lipase: 32 U/L (ref 11–51)

## 2017-08-25 LAB — I-STAT TROPONIN, ED: TROPONIN I, POC: 0 ng/mL (ref 0.00–0.08)

## 2017-08-25 MED ORDER — ONDANSETRON 4 MG PO TBDP
8.0000 mg | ORAL_TABLET | Freq: Once | ORAL | Status: AC
  Administered 2017-08-25: 8 mg via ORAL
  Filled 2017-08-25: qty 2

## 2017-08-25 NOTE — ED Triage Notes (Signed)
The pt is c/o dizziness vomiting and diarrhea since 1500 today  No pain unless she vomits

## 2017-08-25 NOTE — ED Provider Notes (Signed)
Patient placed in Quick Look pathway, seen and evaluated   Chief Complaint: n/v dizziness  HPI:   Nausea, vomiting for about 4 hrs. Initially disoriented and dizzy. States she felt like vision was blurred and everything was spinning. Stats threw up 4 times. 1 episode of diarrhea. No abdominal pain.   ROS: dizziness, n/v  Physical Exam:   Gen: No distress  Neuro: Awake and Alert  Skin: Warm    Focused Exam: nak. No nystagmus. 5/5 and equal bilateral upper and lower extremity strength. Normal coordination.    Initiation of care has begun. The patient has been counseled on the process, plan, and necessity for staying for the completion/evaluation, and the remainder of the medical screening examination  Symptoms consistent with vertigo. Will check labs, ct head due to disorientation and visual changes. Zofran. Will monitor.     Jaynie CrumbleKirichenko, Maxmilian Trostel, PA-C 08/25/17 2117    Melene PlanFloyd, Dan, DO 08/25/17 2327

## 2017-08-26 NOTE — ED Provider Notes (Signed)
[facility] EMERGENCY DEPARTMENT Provider Note   CSN: [id number] Arrival date & time: 08/25/17  1754     History   Chief Complaint Chief Complaint  Patient presents with  . Dizziness    HPI [facility] Malasia Torain is a 72 y.o. female.  Patient here with spouse for evaluation of sudden onset nausea with multiple episodes of vomiting that occurred soon after eating some fruit. She had 2 loose stools. Nausea was associated with abdominal pain and cramping that resolved after vomiting episodes. No sick family members. She felt brief dizziness and poor responsiveness after vomiting but, per husband, has returned to her normal. The patient reports no symptoms currently. No chest pain, SOB, fever.   The history is provided by the patient and the spouse. No language interpreter was used.    Past Medical History:  Diagnosis Date  . Hypertension     Patient Active Problem List   Diagnosis Date Noted  . Health care maintenance 07/30/2014  . Chest wall pain 07/27/2012  . Right leg weakness 07/28/2011  . Cough 02/09/2011  . Knee pain, bilateral 01/21/2011  . Right cervical radiculopathy at C5 11/12/2010  . Left lumbar radiculopathy 10/27/2010  . Plantar fasciitis 10/27/2010  . Hyperlipidemia 03/04/2006  . HYPERTENSION, BENIGN SYSTEMIC 03/04/2006    History reviewed. No pertinent surgical history.   OB History   None      Home Medications    Prior to Admission medications   Medication Sig Start Date End Date Taking? Authorizing Provider  amLODipine (NORVASC) 5 MG tablet Take 1 tablet (5 mg total) by mouth daily. Patient not taking: Reported on 08/26/2017 07/30/14   Smitty Cords, DO  atorvastatin (LIPITOR) 20 MG tablet Take 1 tablet (20 mg total) by mouth daily. Patient not taking: Reported on 08/26/2017 08/30/14   Abram Sander, MD  fluticasone [facility]) 50 MCG/ACT nasal spray Place 2 sprays into the nose daily. Patient not taking: Reported on  07/30/2014 09/07/12   Smitty Cords, DO  gabapentin (NEURONTIN) 300 MG capsule 1 tab po TID Patient not taking: Reported on 07/30/2014 06/08/12   Everrett Coombe, DO  lisinopril-hydrochlorothiazide (ZESTORETIC) 20-12.5 MG per tablet Take 2 tablets by mouth daily. Patient not taking: Reported on 08/26/2017 08/30/14   Abram Sander, MD  meloxicam (MOBIC) 15 MG tablet Take 1 tablet (15 mg total) by mouth daily. Patient not taking: Reported on 07/30/2014 07/27/12   Charm Rings, MD  traMADol (ULTRAM) 50 MG tablet Take 1 tablet (50 mg total) by mouth every 8 (eight) hours as needed. Patient not taking: Reported on 07/30/2014 06/06/13   Smitty Cords, DO    Family History Family History  Problem Relation Age of Onset  . Heart disease Father 67    Social History Social History   Tobacco Use  . Smoking status: Never Smoker  . Smokeless tobacco: Never Used  Substance Use Topics  . Alcohol use: Never    Frequency: Never  . Drug use: Not on file     Allergies   Contrast media [iodinated diagnostic agents]   Review of Systems Review of Systems  Constitutional: Negative for chills and fever.  HENT: Negative.   Respiratory: Negative.   Cardiovascular: Negative.   Gastrointestinal: Positive for abdominal pain, nausea and vomiting.  Musculoskeletal: Negative.   Skin: Negative.   Neurological: Positive for dizziness. Negative for syncope.     Physical Exam Updated Vital Signs BP (!) 186/64 (BP Location: Right Arm)  Pulse (!) 54   Temp 98.7 F (37.1 C) (Oral)   Resp 18   Ht 4\' 11"  (1.499 m)   Wt 55.3 kg   SpO2 99%   BMI 24.64 kg/m   Physical Exam  Constitutional: She is oriented to person, place, and time. She appears well-developed and well-nourished.  HENT:  Head: Normocephalic.  Mouth/Throat: Oropharynx is clear and moist.  Neck: Normal range of motion. Neck supple.  Cardiovascular: Normal rate and regular rhythm.  Pulmonary/Chest: Effort normal and  breath sounds normal.  Abdominal: Soft. Bowel sounds are normal. There is no tenderness. There is no rebound and no guarding.  Musculoskeletal: Normal range of motion.  Neurological: She is alert and oriented to person, place, and time.  Skin: Skin is warm and dry. No rash noted.  Psychiatric: She has a normal mood and affect.     ED Treatments / Results  Labs (all labs ordered are listed, but only abnormal results are displayed) Labs Reviewed  CBC WITH DIFFERENTIAL/PLATELET - Abnormal; Notable for the following components:      Result Value   RBC 5.35 (*)    MCV 75.5 (*)    MCH 22.4 (*)    MCHC 29.7 (*)    All other components within normal limits  COMPREHENSIVE METABOLIC PANEL - Abnormal; Notable for the following components:   Glucose, Bld 142 (*)    All other components within normal limits  URINALYSIS, ROUTINE W REFLEX MICROSCOPIC - Abnormal; Notable for the following components:   Color, Urine STRAW (*)    Specific Gravity, Urine 1.004 (*)    Ketones, ur 5 (*)    All other components within normal limits  LIPASE, BLOOD  I-STAT TROPONIN, ED    EKG EKG Interpretation  Date/Time:  Wednesday August 25 2017 18:03:34 EDT Ventricular Rate:  48 PR Interval:  140 QRS Duration: 98 QT Interval:  476 QTC Calculation: 425 R Axis:   50 Text Interpretation:  Sinus bradycardia Minimal voltage criteria for LVH, may be normal variant Anteroseptal infarct , age undetermined Abnormal ECG When compared with ECG of 09/07/2006, HEART RATE has decreased ST-t abnormality is no longer present Confirmed by Dione Booze ([id number]) on 08/26/2017 12:06:00 AM   Radiology Ct Head Wo Contrast  Result Date: 08/25/2017 CLINICAL DATA:  Dizziness with vomiting EXAM: CT HEAD WITHOUT CONTRAST TECHNIQUE: Contiguous axial images were obtained from the base of the skull through the vertex without intravenous contrast. COMPARISON:  January 13, 2010 FINDINGS: Brain: The ventricles are normal in size and  configuration. There is no intracranial mass, hemorrhage, extra-axial fluid collection, or midline shift. There is mild patchy small vessel disease in the centra semiovale bilaterally. Elsewhere gray-white compartments appear normal. No acute infarct is evident. Vascular: No hyperdense vessel. There is calcification in each carotid siphon and distal vertebral artery. Skull: The bony calvarium appears intact. Sinuses/Orbits: There is mucosal thickening in multiple ethmoid air cells. Other visualized paranasal sinuses are clear. Orbits appear symmetric bilaterally. Other: Mastoid air cells are clear. IMPRESSION: Slight periventricular small vessel disease. No acute infarct. No mass or hemorrhage. There are foci of arterial vascular calcification. There is mucosal thickening in multiple ethmoid air cells. Electronically Signed   By: Bretta Bang III M.D.   On: 08/25/2017 19:10    Procedures Procedures (including critical care time)  Medications Ordered in ED Medications  ondansetron (ZOFRAN-ODT) disintegrating tablet 8 mg (8 mg Oral Given 08/25/17 1818)     Initial Impression / Assessment and Plan / ED  Course  I have reviewed the triage vital signs and the nursing notes.  Pertinent labs & imaging results that were available during my care of the patient were reviewed by me and considered in my medical decision making (see chart for details).     Patient presents with sudden onset abdominal pain, nausea, multiple episodes of vomiting, 2 loose stools, earlier this evening. She reports her symptoms resolved after vomiting episodes. Husband felt she became slow to respond but that this has completely resolved.   The patient had eating some fruit prior to onset of symptoms. Suspect symptoms related to this as she has been asymptomatic since vomiting ended. No concern for acute process.   She can be discharged home. Return precautions discussed.   Final Clinical Impressions(s) / ED Diagnoses    Final diagnoses:  None   1. Abdominal pain 2. Nausea with vomiting  ED Discharge Orders    None       Danne HarborUpstill, Ryker [place], PA-C 08/28/17 1211    Dione BoozeGlick, David, MD 08/28/17 2245

## 2017-08-26 NOTE — ED Notes (Signed)
Discharge instructions reviewed and all questions answered. Unable to sign due to downtime. Pt verbalized understanding.

## 2017-08-26 NOTE — Discharge Instructions (Addendum)
Followup with your doctor as needed

## 2017-09-15 ENCOUNTER — Encounter (HOSPITAL_COMMUNITY): Payer: Medicare Other

## 2017-09-15 ENCOUNTER — Inpatient Hospital Stay (HOSPITAL_COMMUNITY): Payer: Medicare Other

## 2017-09-15 ENCOUNTER — Inpatient Hospital Stay (HOSPITAL_COMMUNITY)
Admission: EM | Admit: 2017-09-15 | Discharge: 2017-09-18 | DRG: 066 | Disposition: A | Discharge status: Home Health | Payer: Medicare Other | Attending: Internal Medicine | Admitting: Internal Medicine

## 2017-09-15 ENCOUNTER — Emergency Department (HOSPITAL_COMMUNITY): Payer: Medicare Other

## 2017-09-15 ENCOUNTER — Encounter (HOSPITAL_COMMUNITY): Payer: Self-pay | Admitting: Emergency Medicine

## 2017-09-15 DIAGNOSIS — E785 Hyperlipidemia, unspecified: Secondary | ICD-10-CM | POA: Diagnosis present

## 2017-09-15 DIAGNOSIS — Z79899 Other long term (current) drug therapy: Secondary | ICD-10-CM | POA: Diagnosis not present

## 2017-09-15 DIAGNOSIS — E782 Mixed hyperlipidemia: Secondary | ICD-10-CM | POA: Diagnosis present

## 2017-09-15 DIAGNOSIS — Z7951 Long term (current) use of inhaled steroids: Secondary | ICD-10-CM

## 2017-09-15 DIAGNOSIS — I5030 Unspecified diastolic (congestive) heart failure: Secondary | ICD-10-CM | POA: Diagnosis not present

## 2017-09-15 DIAGNOSIS — Z9049 Acquired absence of other specified parts of digestive tract: Secondary | ICD-10-CM

## 2017-09-15 DIAGNOSIS — I63031 Cerebral infarction due to thrombosis of right carotid artery: Secondary | ICD-10-CM

## 2017-09-15 DIAGNOSIS — I639 Cerebral infarction, unspecified: Secondary | ICD-10-CM | POA: Diagnosis not present

## 2017-09-15 DIAGNOSIS — R29810 Facial weakness: Secondary | ICD-10-CM | POA: Diagnosis present

## 2017-09-15 DIAGNOSIS — Z8249 Family history of ischemic heart disease and other diseases of the circulatory system: Secondary | ICD-10-CM | POA: Diagnosis not present

## 2017-09-15 DIAGNOSIS — Z823 Family history of stroke: Secondary | ICD-10-CM

## 2017-09-15 DIAGNOSIS — I6381 Other cerebral infarction due to occlusion or stenosis of small artery: Secondary | ICD-10-CM | POA: Diagnosis present

## 2017-09-15 DIAGNOSIS — R739 Hyperglycemia, unspecified: Secondary | ICD-10-CM | POA: Diagnosis present

## 2017-09-15 DIAGNOSIS — Z79891 Long term (current) use of opiate analgesic: Secondary | ICD-10-CM

## 2017-09-15 DIAGNOSIS — R471 Dysarthria and anarthria: Secondary | ICD-10-CM | POA: Diagnosis present

## 2017-09-15 DIAGNOSIS — I6300 Cerebral infarction due to thrombosis of unspecified precerebral artery: Secondary | ICD-10-CM

## 2017-09-15 DIAGNOSIS — R001 Bradycardia, unspecified: Secondary | ICD-10-CM | POA: Diagnosis not present

## 2017-09-15 DIAGNOSIS — R4182 Altered mental status, unspecified: Secondary | ICD-10-CM | POA: Diagnosis not present

## 2017-09-15 DIAGNOSIS — I10 Essential (primary) hypertension: Secondary | ICD-10-CM | POA: Diagnosis present

## 2017-09-15 DIAGNOSIS — R29703 NIHSS score 3: Secondary | ICD-10-CM | POA: Diagnosis present

## 2017-09-15 LAB — TSH: TSH: 2.357 u[IU]/mL (ref 0.350–4.500)

## 2017-09-15 LAB — I-STAT CHEM 8, ED
BUN: 9 mg/dL (ref 8–23)
Calcium, Ion: 1.17 mmol/L (ref 1.15–1.40)
Chloride: 101 mmol/L (ref 98–111)
Creatinine, Ser: 0.7 mg/dL (ref 0.44–1.00)
Glucose, Bld: 107 mg/dL — ABNORMAL HIGH (ref 70–99)
HCT: 44 % (ref 36.0–46.0)
Hemoglobin: 15 g/dL (ref 12.0–15.0)
Potassium: 4.4 mmol/L (ref 3.5–5.1)
Sodium: 138 mmol/L (ref 135–145)
TCO2: 29 mmol/L (ref 22–32)

## 2017-09-15 LAB — I-STAT TROPONIN, ED: Troponin i, poc: 0.01 ng/mL (ref 0.00–0.08)

## 2017-09-15 LAB — DIFFERENTIAL
Abs Immature Granulocytes: 0 10*3/uL (ref 0.0–0.1)
BASOS ABS: 0.1 10*3/uL (ref 0.0–0.1)
Basophils Relative: 1 %
Eosinophils Absolute: 0 10*3/uL (ref 0.0–0.7)
Eosinophils Relative: 1 %
Immature Granulocytes: 0 %
LYMPHS PCT: 33 %
Lymphs Abs: 1.6 10*3/uL (ref 0.7–4.0)
Monocytes Absolute: 0.4 10*3/uL (ref 0.1–1.0)
Monocytes Relative: 8 %
NEUTROS ABS: 2.8 10*3/uL (ref 1.7–7.7)
Neutrophils Relative %: 57 %

## 2017-09-15 LAB — CBC
HCT: 43.6 % (ref 36.0–46.0)
Hemoglobin: 13 g/dL (ref 12.0–15.0)
MCH: 22.6 pg — ABNORMAL LOW (ref 26.0–34.0)
MCHC: 29.8 g/dL — ABNORMAL LOW (ref 30.0–36.0)
MCV: 75.7 fL — ABNORMAL LOW (ref 78.0–100.0)
Platelets: 263 K/uL (ref 150–400)
RBC: 5.76 MIL/uL — ABNORMAL HIGH (ref 3.87–5.11)
RDW: 13.5 % (ref 11.5–15.5)
WBC: 4.9 K/uL (ref 4.0–10.5)

## 2017-09-15 LAB — COMPREHENSIVE METABOLIC PANEL
ALK PHOS: 67 U/L (ref 38–126)
ALT: 19 U/L (ref 0–44)
AST: 25 U/L (ref 15–41)
Albumin: 3.7 g/dL (ref 3.5–5.0)
Anion gap: 9 (ref 5–15)
BUN: 7 mg/dL — AB (ref 8–23)
CALCIUM: 9.2 mg/dL (ref 8.9–10.3)
CO2: 26 mmol/L (ref 22–32)
CREATININE: 0.75 mg/dL (ref 0.44–1.00)
Chloride: 102 mmol/L (ref 98–111)
GFR calc Af Amer: >60 mL/min (ref >60)
GFR calc non Af Amer: >60 mL/min (ref >60)
GLUCOSE: 107 mg/dL — AB (ref 70–99)
Potassium: 4.2 mmol/L (ref 3.5–5.1)
SODIUM: 137 mmol/L (ref 135–145)
Total Bilirubin: 0.6 mg/dL (ref 0.3–1.2)
Total Protein: 7.4 g/dL (ref 6.5–8.1)

## 2017-09-15 LAB — PROTIME-INR
INR: 0.94
Prothrombin Time: 12.5 s (ref 11.4–15.2)

## 2017-09-15 LAB — APTT: aPTT: 26 s (ref 24–36)

## 2017-09-15 MED ORDER — HYDRALAZINE HCL 20 MG/ML IJ SOLN: 5.0000 mg | INTRAMUSCULAR | Status: DC | PRN

## 2017-09-15 MED ORDER — GADOBUTROL 1 MMOL/ML IV SOLN
5.0000 mL | Freq: Once | INTRAVENOUS | Status: AC | PRN
  Administered 2017-09-15: 5 mL via INTRAVENOUS

## 2017-09-15 MED ORDER — ASPIRIN 300 MG RE SUPP: 300.0000 mg | Freq: Every day | RECTAL | Status: DC

## 2017-09-15 MED ORDER — IOPAMIDOL (ISOVUE-370) INJECTION 76%
INTRAVENOUS | Status: AC
  Filled 2017-09-15: qty 50

## 2017-09-15 MED ORDER — HEPARIN (PORCINE) IN NACL 100-0.45 UNIT/ML-% IJ SOLN
600.0000 [IU]/h | INTRAMUSCULAR | Status: DC
  Administered 2017-09-15: 650 [IU]/h via INTRAVENOUS
  Filled 2017-09-15 (×2): qty 250

## 2017-09-15 MED ORDER — ASPIRIN 325 MG PO TABS
325.0000 mg | ORAL_TABLET | Freq: Every day | ORAL | Status: DC
  Administered 2017-09-15 – 2017-09-16 (×2): 325 mg via ORAL
  Filled 2017-09-15 (×2): qty 1

## 2017-09-15 MED ORDER — ACETAMINOPHEN 325 MG PO TABS: 650.0000 mg | ORAL_TABLET | ORAL | Status: DC | PRN

## 2017-09-15 MED ORDER — SODIUM CHLORIDE 0.9 % IV SOLN
INTRAVENOUS | Status: DC
  Administered 2017-09-15 – 2017-09-18 (×3): via INTRAVENOUS

## 2017-09-15 MED ORDER — AMLODIPINE BESYLATE 5 MG PO TABS: 5.0000 mg | ORAL_TABLET | Freq: Every day | ORAL | Status: DC

## 2017-09-15 MED ORDER — LISINOPRIL-HYDROCHLOROTHIAZIDE 20-12.5 MG PO TABS: 2.0000 | ORAL_TABLET | Freq: Every day | ORAL | Status: DC

## 2017-09-15 MED ORDER — SENNOSIDES-DOCUSATE SODIUM 8.6-50 MG PO TABS: 1.0000 | ORAL_TABLET | Freq: Every evening | ORAL | Status: DC | PRN

## 2017-09-15 MED ORDER — ATORVASTATIN CALCIUM 80 MG PO TABS
80.0000 mg | ORAL_TABLET | Freq: Every day | ORAL | Status: DC
  Administered 2017-09-15 – 2017-09-18 (×4): 80 mg via ORAL
  Filled 2017-09-15 (×3): qty 1
  Filled 2017-09-15: qty 4

## 2017-09-15 MED ORDER — STROKE: EARLY STAGES OF RECOVERY BOOK
Freq: Once | Status: AC
  Administered 2017-09-15: 21:00:00
  Filled 2017-09-15: qty 1

## 2017-09-15 MED ORDER — IOPAMIDOL (ISOVUE-370) INJECTION 76%
50.0000 mL | Freq: Once | INTRAVENOUS | Status: AC | PRN
  Administered 2017-09-15: 50 mL via INTRAVENOUS

## 2017-09-15 MED ORDER — ACETAMINOPHEN 160 MG/5ML PO SOLN: 650.0000 mg | ORAL | Status: DC | PRN

## 2017-09-15 MED ORDER — ACETAMINOPHEN 650 MG RE SUPP: 650.0000 mg | RECTAL | Status: DC | PRN

## 2017-09-15 NOTE — ED Provider Notes (Signed)
Emergency Department Provider Note   I have reviewed the triage vital signs and the nursing notes.   HISTORY  Chief Complaint Altered Mental Status   HPI [facility] Sherry Fitzgerald is a 72 y.o. female with PMH of HTN's to the emergency department for evaluation of speech changes and left face weakness.  The patient's husband rides most of the history.  He states for the last 2 days she has had increased confusion.  He states that she seems to have lost some of her personality and is doing odd things like attempting to make coffee without using the coffee grounds or cooking egg rolls at midnight.  She is arguing with her husband about time of day and becoming easily agitated.  He states that 2 days ago he noticed that her left face seemed different from the right and she has seemed like she has not speaking as fluently or clearly. Patient denies any pain.  No UTI symptoms.  No prior history of stroke.   Past Medical History:  Diagnosis Date  . Hypertension     Patient Active Problem List   Diagnosis Date Noted  . Health care maintenance 07/30/2014  . Chest wall pain 07/27/2012  . Right leg weakness 07/28/2011  . Cough 02/09/2011  . Knee pain, bilateral 01/21/2011  . Right cervical radiculopathy at C5 11/12/2010  . Left lumbar radiculopathy 10/27/2010  . Plantar fasciitis 10/27/2010  . Hyperlipidemia 03/04/2006  . HYPERTENSION, BENIGN SYSTEMIC 03/04/2006    History reviewed. No pertinent surgical history.  Allergies Patient has no active allergies.  Family History  Problem Relation Age of Onset  . Heart disease Father 79    Social History Social History   Tobacco Use  . Smoking status: Never Smoker  . Smokeless tobacco: Never Used  Substance Use Topics  . Alcohol use: Never    Frequency: Never  . Drug use: Not on file    Review of Systems  Constitutional: No fever/chills Eyes: No visual changes. ENT: No sore throat. Cardiovascular: Denies chest  pain. Respiratory: Denies shortness of breath. Gastrointestinal: No abdominal pain.  No nausea, no vomiting.  No diarrhea.  No constipation. Genitourinary: Negative for dysuria. Musculoskeletal: Negative for back pain. Skin: Negative for rash. Neurological: Negative for headaches. Positive left face weakness and speech change.   10-point ROS otherwise negative.  ____________________________________________   PHYSICAL EXAM:  VITAL SIGNS: ED Triage Vitals  Enc Vitals Group     BP 09/15/17 1141 (!) 158/69     Pulse Rate 09/15/17 1141 (!) 59     Resp 09/15/17 1141 16     Temp 09/15/17 1141 97.9 F (36.6 C)     Temp Source 09/15/17 1141 Oral     SpO2 09/15/17 1141 99 %     Pain Score 09/15/17 1143 0   Constitutional: Alert and oriented. Well appearing and in no acute distress. Eyes: Conjunctivae are normal.  Head: Atraumatic. Nose: No congestion/rhinnorhea. Mouth/Throat: Mucous membranes are slightly dry.  Neck: No stridor.   Cardiovascular: Normal rate, regular rhythm. Good peripheral circulation. Grossly normal heart sounds.   Respiratory: Normal respiratory effort.  No retractions. Lungs CTAB. Gastrointestinal: Soft and nontender. No distention.  Musculoskeletal: No lower extremity tenderness nor edema. No gross deformities of extremities. Neurologic: Speech with some hesitancy at times. Positive left face asymmetry. No pronator drift. Normal strength and sensation in the bilateral upper and lower extremities.  Skin:  Skin is warm, dry and intact. No rash noted.   ____________________________________________  LABS (all labs ordered are listed, but only abnormal results are displayed)  Labs Reviewed  CBC - Abnormal; Notable for the following components:      Result Value   RBC 5.76 (*)    MCV 75.7 (*)    MCH 22.6 (*)    MCHC 29.8 (*)    All other components within normal limits  COMPREHENSIVE METABOLIC PANEL - Abnormal; Notable for the following components:    Glucose, Bld 107 (*)    BUN 7 (*)    All other components within normal limits  I-STAT CHEM 8, ED - Abnormal; Notable for the following components:   Glucose, Bld 107 (*)    All other components within normal limits  PROTIME-INR  APTT  DIFFERENTIAL  RAPID URINE DRUG SCREEN, HOSP PERFORMED  URINALYSIS, ROUTINE W REFLEX MICROSCOPIC  I-STAT TROPONIN, ED  CBG MONITORING, ED   ____________________________________________  EKG   EKG Interpretation  Date/Time:  Wednesday September 15 2017 11:51:44 EDT Ventricular Rate:  56 PR Interval:  132 QRS Duration: 84 QT Interval:  420 QTC Calculation: 405 R Axis:   62 Text Interpretation:  Sinus bradycardia Septal infarct , age undetermined Abnormal ECG No STEMI.  Confirmed by Alona Bene [phone]) on 09/15/2017 1:24:37 PM       ____________________________________________  RADIOLOGY  Ct Head Wo Contrast  Result Date: 09/15/2017 CLINICAL DATA:  Dizziness. Altered mental status and weakness. Memory impairment. EXAM: CT HEAD WITHOUT CONTRAST TECHNIQUE: Contiguous axial images were obtained from the base of the skull through the vertex without intravenous contrast. COMPARISON:  08/25/2017 FINDINGS: Brain: There is acute or subacute infarction in the region of the right frontal white matter/caudate head. No evidence of hemorrhage or mass effect. The remainder the brain appears unchanged with a few old frontal white matter infarctions but no other focal finding. No mass lesion, hydrocephalus or extra-axial collection. Vascular: There is atherosclerotic calcification of the major vessels at the base of the brain. Cannot rule out a 5 mm right MCA aneurysm. Skull: Negative Sinuses/Orbits: Clear/normal Other: None IMPRESSION: Newly seen 1-2 cm region of low-density in the right frontal lobe in the region of the deep white matter and caudate head consistent with acute or subacute infarction. No hemorrhage or mass effect. Few other old frontal white matter  infarctions as seen previously. Atherosclerotic change of the major vessels at base of the brain. Question 5 mm right MCA region aneurysm. Electronically Signed   By: Paulina Fusi M.D.   On: 09/15/2017 12:52    ____________________________________________   PROCEDURES  Procedure(s) performed:   Procedures  None ____________________________________________   INITIAL IMPRESSION / ASSESSMENT AND PLAN / ED COURSE  Pertinent labs & imaging results that were available during my care of the patient were reviewed by me and considered in my medical decision making (see chart for details).  Presents to the emergency department for evaluation of strokelike symptoms.  Patient with deficits noted above.  Labs reviewed and largely unremarkable.  CT shows acute to subacute infarct on the right. Plan for MRI and admit for further CVA w/u. Spoke with Dr. Jerrell Belfast who will consult on the patient during admission.   Discussed patient's case with Hospitalist, Dr. Ophelia Charter to request admission. Patient and family (if present) updated with plan. Care transferred to Hospitalist service.  I reviewed all nursing notes, vitals, pertinent old records, EKGs, labs, imaging (as available).  ____________________________________________  FINAL CLINICAL IMPRESSION(S) / ED DIAGNOSES  Final diagnoses:  Cerebrovascular accident (CVA), unspecified mechanism (HCC)  Note:  This document was prepared using Dragon voice recognition software and may include unintentional dictation errors.  Alona Bene, MD Emergency Medicine    Skylinn Vialpando, Arlyss Repress, MD 09/15/17 [phone]

## 2017-09-15 NOTE — Progress Notes (Signed)
Pt arrived safely on the unit 

## 2017-09-15 NOTE — ED Notes (Signed)
Pt reports unable to urinate as she "just went"

## 2017-09-15 NOTE — H&P (Signed)
History and Physical    [address] Sherry Fitzgerald ZOX:[id number] DOB: June 13, 1945 DOA: 09/15/2017  PCP: Myrene Buddy, MD Consultants:  None Patient coming from:  Home - lives with husband; NOK: Husband, [phone]  Chief Complaint: AMS  HPI: Sherry Fitzgerald is a 72 y.o. female with medical history significant of HTN presenting with AMS.  She was here on 8/21 for dizziness, GI upset, weakness.  She usually walks 2-4 miles twice a day, "the life of the party, upbeat."  She resumed baseline a bit but last Friday she suddenly "wasn't there, she was just gone."  She couldn't understand her husband's questions, would forget dates, times.  It progressively got worse.  She developed facial droop, became irritable.  Monday night, they went to a meeting and stopped at the bank on the way - she didn't remember stopping at the bank.  She couldn't remember their address, zip code.   She fell asleep on the couch last night watching tv; when her husband woke her up at 1AM, she thought it was 5AM and wouldn't believe his watch and then started cooking egg rolls.  She fed her husband 4 meals in 4 hours.  She refused to go to the hospital yesterday; he finally convinced her to come this AM.  She is slow to walk, but "it's not like she has a physical problem, it's like the coordination is not there."  She took HTN medications for a brief period of time in 2016, but stopped them long ago. She is [place] and "eats leaves" from various plants/trees to keep her BP under control.   ED Course:   2 days ago, acting funny.  Left facial droop, speech and personality different.  Right frontal caudate acute/subacute infarct on CT.  Neurology to see.  Review of Systems: As per HPI; otherwise review of systems reviewed and negative.   Ambulatory Status:  Ambulates without assistance  Past Medical History:  Diagnosis Date  . Hypertension    no medications for years    Past Surgical History:  Procedure Laterality Date  .  CHOLECYSTECTOMY  2014    Social History   Socioeconomic History  . Marital status: Divorced    Spouse name: Not on file  . Number of children: Not on file  . Years of education: Not on file  . Highest education level: Not on file  Occupational History  . Occupation: retired  Engineer, production  . Financial resource strain: Not on file  . Food insecurity:    Worry: Not on file    Inability: Not on file  . Transportation needs:    Medical: Not on file    Non-medical: Not on file  Tobacco Use  . Smoking status: Never Smoker  . Smokeless tobacco: Never Used  Substance and Sexual Activity  . Alcohol use: Never    Frequency: Never  . Drug use: Never  . Sexual activity: Not on file  Lifestyle  . Physical activity:    Days per week: Not on file    Minutes per session: Not on file  . Stress: Not on file  Relationships  . Social connections:    Talks on phone: Not on file    Gets together: Not on file    Attends religious service: Not on file    Active member of club or organization: Not on file    Attends meetings of clubs or organizations: Not on file    Relationship status: Not on file  .  Intimate partner violence:    Fear of current or ex partner: Not on file    Emotionally abused: Not on file    Physically abused: Not on file    Forced sexual activity: Not on file  Other Topics Concern  . Not on file  Social History Narrative   Former Editor, commissioning.  Lives alone, but has 5 children in the area who are grown and supportive.  +Financial difficulties - trouble paying bills.    No Active Allergies  Family History  Problem Relation Age of Onset  . CVA Mother   . Heart disease Father 54  . CVA Father     Prior to Admission medications   Medication Sig Start Date End Date Taking? Authorizing Provider  amLODipine (NORVASC) 5 MG tablet Take 1 tablet (5 mg total) by mouth daily. Patient not taking: Reported on 08/26/2017 07/30/14   Smitty Cords, DO    atorvastatin (LIPITOR) 20 MG tablet Take 1 tablet (20 mg total) by mouth daily. Patient not taking: Reported on 08/26/2017 08/30/14   Abram Sander, MD  fluticasone [facility]) 50 MCG/ACT nasal spray Place 2 sprays into the nose daily. Patient not taking: Reported on 07/30/2014 09/07/12   Smitty Cords, DO  gabapentin (NEURONTIN) 300 MG capsule 1 tab po TID Patient not taking: Reported on 07/30/2014 06/08/12   Everrett Coombe, DO  lisinopril-hydrochlorothiazide (ZESTORETIC) 20-12.5 MG per tablet Take 2 tablets by mouth daily. Patient not taking: Reported on 09/15/2017 08/30/14   Abram Sander, MD  meloxicam (MOBIC) 15 MG tablet Take 1 tablet (15 mg total) by mouth daily. Patient not taking: Reported on 09/15/2017 07/27/12   Charm Rings, MD  traMADol (ULTRAM) 50 MG tablet Take 1 tablet (50 mg total) by mouth every 8 (eight) hours as needed. Patient not taking: Reported on 07/30/2014 06/06/13   Smitty Cords, DO    Physical Exam: Vitals:   09/15/17 1330 09/15/17 1345 09/15/17 1538 09/15/17 1630  BP: (!) 203/66 (!) 212/93 (!) 175/94 (!) 176/77  Pulse: (!) 49 (!) 50 (!) 51 (!) 51  Resp: 15 15 16 14   Temp:      TempSrc:      SpO2: 100%  99% 98%     General:  Appears calm and comfortable and is NAD Eyes:  PERRL, EOMI, normal lids, iris ENT:  grossly normal hearing, lips & tongue, mmm; appropriate dentition Neck:  no LAD, masses or thyromegaly; no carotid bruits Cardiovascular:  RRR, no m/r/g. No LE edema.  Respiratory:   CTA bilaterally with no wheezes/rales/rhonchi.  Normal respiratory effort. Abdomen:  soft, NT, ND, NABS Back:   normal alignment, no CVAT Skin:  no rash or induration seen on limited exam Musculoskeletal:  grossly normal tone BUE/BLE, good ROM, no bony abnormality Lower extremity:  No LE edema.  Limited foot exam with no ulcerations.  2+ distal pulses. Psychiatric:  flat mood and affect, speech fluent and appropriate but a bit disconnected, AOx3 Neurologic:   CN 2-12 grossly intact, moves all extremities in coordinated fashion, sensation intact, mildly slow to respond to commands leading one to question whether she understands what is being asked of her    Radiological Exams on Admission: Ct Head Wo Contrast  Result Date: 09/15/2017 CLINICAL DATA:  Dizziness. Altered mental status and weakness. Memory impairment. EXAM: CT HEAD WITHOUT CONTRAST TECHNIQUE: Contiguous axial images were obtained from the base of the skull through the vertex without intravenous contrast. COMPARISON:  08/25/2017 FINDINGS:  Brain: There is acute or subacute infarction in the region of the right frontal white matter/caudate head. No evidence of hemorrhage or mass effect. The remainder the brain appears unchanged with a few old frontal white matter infarctions but no other focal finding. No mass lesion, hydrocephalus or extra-axial collection. Vascular: There is atherosclerotic calcification of the major vessels at the base of the brain. Cannot rule out a 5 mm right MCA aneurysm. Skull: Negative Sinuses/Orbits: Clear/normal Other: None IMPRESSION: Newly seen 1-2 cm region of low-density in the right frontal lobe in the region of the deep white matter and caudate head consistent with acute or subacute infarction. No hemorrhage or mass effect. Few other old frontal white matter infarctions as seen previously. Atherosclerotic change of the major vessels at base of the brain. Question 5 mm right MCA region aneurysm. Electronically Signed   By: Paulina Fusi M.D.   On: 09/15/2017 12:52   Mr Maxine Glenn Head Wo Contrast  Result Date: 09/15/2017 CLINICAL DATA:  Stroke. Left facial weakness. Speech change. Abnormal CT. EXAM: MRI HEAD WITHOUT AND WITH CONTRAST MRA HEAD WITHOUT CONTRAST MRA NECK WITHOUT AND WITH CONTRAST TECHNIQUE: Multiplanar, multiecho pulse sequences of the brain and surrounding structures were obtained without and with intravenous contrast. Angiographic images of the Circle of Willis  were obtained using MRA technique without intravenous contrast. Angiographic images of the neck were obtained using MRA technique without and with intravenous contrast. Carotid stenosis measurements (when applicable) are obtained utilizing NASCET criteria, using the distal internal carotid diameter as the denominator. CONTRAST:  5 mL Gadovist IV COMPARISON:  CT head 09/15/2017 FINDINGS: MRI HEAD FINDINGS Brain: Acute infarct right frontal white matter. Adjacent scattered small white matter and cortical infarcts also in the right frontal lobe. Small acute infarct in the right caudate. This corresponds to the hypodensity on CT. No other areas of acute infarct elsewhere in the brain. Ventricle size normal. Mild chronic microvascular ischemic change in the white matter. Chronic microhemorrhage in the right lateral thalamus and left putamen. Blood products associated with right MCA aneurysm possibly due to partial thrombosis of aneurysm. No evidence of subarachnoid hemorrhage. Vascular: Normal arterial flow voids. There is a right MCA aneurysm with associated susceptibility suggesting thrombosis or possibly clot around the aneurysm. Skull and upper cervical spine: Negative Sinuses/Orbits: Negative Other: None MRA HEAD FINDINGS Left vertebral artery is widely patent and supplies the basilar. Minimal if any flow distal right vertebral artery. Left PICA patent. AICA patent bilaterally. Superior cerebellar and posterior cerebral arteries widely patent bilaterally. Mild atherosclerotic irregularity in the cavernous carotid bilaterally. Anterior cerebral arteries widely patent. Left middle cerebral artery widely patent 5 mm aneurysm right MCA bifurcation projecting superiorly. There is susceptibility associated with this aneurysm. High signal on T1 within the aneurysm. The aneurysm contour is irregular raising the possibility of recent thrombus within the aneurysm. Anterior branch of the right middle cerebral artery supplying  the right frontal lobe is not seen on the right but is on the left and may be occluded or have slow flow. MRA NECK FINDINGS Antegrade flow in both carotid arteries and left vertebral artery. Minimal flow in the right vertebral artery. Left vertebral artery dominant and widely patent to the basilar. Right vertebral artery is very small and diffusely disease but appears to have minimal contribution to the basilar. Carotid bifurcation patent bilaterally without stenosis. IMPRESSION: 1. Acute infarct in the deep white matter of the right frontal lobe also extending into the head of the caudate on the right.  Scattered small peripheral cortical infarcts in the right frontal lobe 2. 5 mm distal right MCA aneurysm. Aneurysm has an unusual configuration with high signal on T1 and susceptibility suggesting partial thrombosis. No evidence of subarachnoid hemorrhage on CT or MRI. Given the proximity of the acute infarct, this raises the possibility of thrombi from the aneurysm. In addition, there appears to be an occluded branch of the right frontal lobe from the right MCA. Recommend CTA head for further evaluation. 3. Both carotid arteries widely patent. Left vertebral artery widely patent. Small diffusely disease right vertebral artery. 4. These results were called by telephone at the time of interpretation on 09/15/2017 at 4:34 pm to Dr. Jerrell Belfast, who verbally acknowledged these results. Electronically Signed   By: Marlan Palau M.D.   On: 09/15/2017 16:35   Mr Angiogram Neck W Or Wo Contrast  Result Date: 09/15/2017 CLINICAL DATA:  Stroke. Left facial weakness. Speech change. Abnormal CT. EXAM: MRI HEAD WITHOUT AND WITH CONTRAST MRA HEAD WITHOUT CONTRAST MRA NECK WITHOUT AND WITH CONTRAST TECHNIQUE: Multiplanar, multiecho pulse sequences of the brain and surrounding structures were obtained without and with intravenous contrast. Angiographic images of the Circle of Willis were obtained using MRA technique without  intravenous contrast. Angiographic images of the neck were obtained using MRA technique without and with intravenous contrast. Carotid stenosis measurements (when applicable) are obtained utilizing NASCET criteria, using the distal internal carotid diameter as the denominator. CONTRAST:  5 mL Gadovist IV COMPARISON:  CT head 09/15/2017 FINDINGS: MRI HEAD FINDINGS Brain: Acute infarct right frontal white matter. Adjacent scattered small white matter and cortical infarcts also in the right frontal lobe. Small acute infarct in the right caudate. This corresponds to the hypodensity on CT. No other areas of acute infarct elsewhere in the brain. Ventricle size normal. Mild chronic microvascular ischemic change in the white matter. Chronic microhemorrhage in the right lateral thalamus and left putamen. Blood products associated with right MCA aneurysm possibly due to partial thrombosis of aneurysm. No evidence of subarachnoid hemorrhage. Vascular: Normal arterial flow voids. There is a right MCA aneurysm with associated susceptibility suggesting thrombosis or possibly clot around the aneurysm. Skull and upper cervical spine: Negative Sinuses/Orbits: Negative Other: None MRA HEAD FINDINGS Left vertebral artery is widely patent and supplies the basilar. Minimal if any flow distal right vertebral artery. Left PICA patent. AICA patent bilaterally. Superior cerebellar and posterior cerebral arteries widely patent bilaterally. Mild atherosclerotic irregularity in the cavernous carotid bilaterally. Anterior cerebral arteries widely patent. Left middle cerebral artery widely patent 5 mm aneurysm right MCA bifurcation projecting superiorly. There is susceptibility associated with this aneurysm. High signal on T1 within the aneurysm. The aneurysm contour is irregular raising the possibility of recent thrombus within the aneurysm. Anterior branch of the right middle cerebral artery supplying the right frontal lobe is not seen on the  right but is on the left and may be occluded or have slow flow. MRA NECK FINDINGS Antegrade flow in both carotid arteries and left vertebral artery. Minimal flow in the right vertebral artery. Left vertebral artery dominant and widely patent to the basilar. Right vertebral artery is very small and diffusely disease but appears to have minimal contribution to the basilar. Carotid bifurcation patent bilaterally without stenosis. IMPRESSION: 1. Acute infarct in the deep white matter of the right frontal lobe also extending into the head of the caudate on the right. Scattered small peripheral cortical infarcts in the right frontal lobe 2. 5 mm distal right MCA aneurysm. Aneurysm  has an unusual configuration with high signal on T1 and susceptibility suggesting partial thrombosis. No evidence of subarachnoid hemorrhage on CT or MRI. Given the proximity of the acute infarct, this raises the possibility of thrombi from the aneurysm. In addition, there appears to be an occluded branch of the right frontal lobe from the right MCA. Recommend CTA head for further evaluation. 3. Both carotid arteries widely patent. Left vertebral artery widely patent. Small diffusely disease right vertebral artery. 4. These results were called by telephone at the time of interpretation on 09/15/2017 at 4:34 pm to Dr. Jerrell Belfast, who verbally acknowledged these results. Electronically Signed   By: Marlan Palau M.D.   On: 09/15/2017 16:35   Mr Brain Wo Contrast  Result Date: 09/15/2017 CLINICAL DATA:  Stroke. Left facial weakness. Speech change. Abnormal CT. EXAM: MRI HEAD WITHOUT AND WITH CONTRAST MRA HEAD WITHOUT CONTRAST MRA NECK WITHOUT AND WITH CONTRAST TECHNIQUE: Multiplanar, multiecho pulse sequences of the brain and surrounding structures were obtained without and with intravenous contrast. Angiographic images of the Circle of Willis were obtained using MRA technique without intravenous contrast. Angiographic images of the neck were  obtained using MRA technique without and with intravenous contrast. Carotid stenosis measurements (when applicable) are obtained utilizing NASCET criteria, using the distal internal carotid diameter as the denominator. CONTRAST:  5 mL Gadovist IV COMPARISON:  CT head 09/15/2017 FINDINGS: MRI HEAD FINDINGS Brain: Acute infarct right frontal white matter. Adjacent scattered small white matter and cortical infarcts also in the right frontal lobe. Small acute infarct in the right caudate. This corresponds to the hypodensity on CT. No other areas of acute infarct elsewhere in the brain. Ventricle size normal. Mild chronic microvascular ischemic change in the white matter. Chronic microhemorrhage in the right lateral thalamus and left putamen. Blood products associated with right MCA aneurysm possibly due to partial thrombosis of aneurysm. No evidence of subarachnoid hemorrhage. Vascular: Normal arterial flow voids. There is a right MCA aneurysm with associated susceptibility suggesting thrombosis or possibly clot around the aneurysm. Skull and upper cervical spine: Negative Sinuses/Orbits: Negative Other: None MRA HEAD FINDINGS Left vertebral artery is widely patent and supplies the basilar. Minimal if any flow distal right vertebral artery. Left PICA patent. AICA patent bilaterally. Superior cerebellar and posterior cerebral arteries widely patent bilaterally. Mild atherosclerotic irregularity in the cavernous carotid bilaterally. Anterior cerebral arteries widely patent. Left middle cerebral artery widely patent 5 mm aneurysm right MCA bifurcation projecting superiorly. There is susceptibility associated with this aneurysm. High signal on T1 within the aneurysm. The aneurysm contour is irregular raising the possibility of recent thrombus within the aneurysm. Anterior branch of the right middle cerebral artery supplying the right frontal lobe is not seen on the right but is on the left and may be occluded or have slow  flow. MRA NECK FINDINGS Antegrade flow in both carotid arteries and left vertebral artery. Minimal flow in the right vertebral artery. Left vertebral artery dominant and widely patent to the basilar. Right vertebral artery is very small and diffusely disease but appears to have minimal contribution to the basilar. Carotid bifurcation patent bilaterally without stenosis. IMPRESSION: 1. Acute infarct in the deep white matter of the right frontal lobe also extending into the head of the caudate on the right. Scattered small peripheral cortical infarcts in the right frontal lobe 2. 5 mm distal right MCA aneurysm. Aneurysm has an unusual configuration with high signal on T1 and susceptibility suggesting partial thrombosis. No evidence of subarachnoid hemorrhage on CT  or MRI. Given the proximity of the acute infarct, this raises the possibility of thrombi from the aneurysm. In addition, there appears to be an occluded branch of the right frontal lobe from the right MCA. Recommend CTA head for further evaluation. 3. Both carotid arteries widely patent. Left vertebral artery widely patent. Small diffusely disease right vertebral artery. 4. These results were called by telephone at the time of interpretation on 09/15/2017 at 4:34 pm to Dr. Jerrell Belfast, who verbally acknowledged these results. Electronically Signed   By: Marlan Palau M.D.   On: 09/15/2017 16:35    EKG: Independently reviewed.  Sinus bradycardia with rate 69; nonspecific ST changes with no evidence of acute ischemia   Labs on Admission: I have personally reviewed the available labs and imaging studies at the time of the admission.  Pertinent labs:   Glucose 107 CMP otherwise essentially normal Troponin 0.01 WBC 4.9 INR 0.94  Assessment/Plan Principal Problem:   CVA (cerebral vascular accident) (HCC) Active Problems:   Hyperlipidemia   HYPERTENSION, BENIGN SYSTEMIC   Hyperglycemia   CVA -Patient presenting with clearly altered sensorium and  left facial droop -Somewhat concerning for TIA/CVA -CT is positive for acute/subacute frontal lobe infarct near the caudate nucleus and smaller embolic right frontal lobe infarcts -MRI/MRA confirms this as also diagnosis a 5 mm distal right MCA aneurysm with possible partial thrombosis which could be the source of the CVA -Will admit for further CVA evaluation -Telemetry monitoring -CTA head/neck -Echo -Risk stratification with FLP, A1c; will also check TSH and UDS -ASA daily -Neurology consult -PT/OT/ST/Nutrition Consults -She may need SW consult for placement -Patient with bradycardia and HTN at the time of my evaluation which was somewhat concerning for Cushing Reflex.  Patient was d/w neurology and Dr. Wilford Corner was not concerned about this given the location and appearance of the CVA on CT.  HTN -Allow permissive HTN for now -Treat BP only if >220/120, and then with goal of 15% reduction -She has marked HTN and may require IV hydralazine prn -She appears very likely to need outpatient HTN management   HLD -Check FLP -Previously given statin in 2016 but stopped taking it; her LDL in 7/16 was 191 -Will start Lipitor 80 mg daily   Hyperglycemia -May be stress response -Will follow with fasting AM labs and A1c      DVT prophylaxis:  SCDs for now Code Status: Full - confirmed with patient/family Family Communication: Husband present throughout evaluation Disposition Plan:  Home once clinically improved unless therapy assessments indicate that placement is needed Consults called: Neurology; PT/OT/ST/Nutrition  Admission status: Admit - It is my clinical opinion that admission to INPATIENT is reasonable and necessary because of the expectation that this patient will require hospital care that crosses at least 2 midnights to treat this condition based on the medical complexity of the problems presented.  Given the aforementioned information, the predictability of an adverse outcome is  felt to be significant.    Jonah Blue MD [facility]  If note is complete, please contact covering daytime or nighttime physician. [email or web address] Password [facility]  09/15/2017, 5:04 PM

## 2017-09-15 NOTE — ED Triage Notes (Signed)
Pt presents to ED for assessment of dizziness, altered mental status, and weakness.  Patient's family member states this has been intermittent for a few weeks, but starting three days ago, he states she has been having worsening memory impairment, and this morning at 1215am, patient got up out of bed and began to cook egg rolls.  She then tried to cook egg rolls she's already cooked.  Patient slow to respond in triage.  Pt has been having higher BPs than normal as well.

## 2017-09-15 NOTE — Progress Notes (Signed)
Paged Dr. Otelia Limes to explain heparin gtt to pt and family due to their concern over aneurysm.

## 2017-09-15 NOTE — Progress Notes (Signed)
ANTICOAGULATION CONSULT NOTE - Initial Consult  Pharmacy Consult for heparin Indication: stroke  No Active Allergies  Patient Measurements: Height: 4\' 9"  (144.8 cm) Weight: 123 lb 14.4 oz (56.2 kg) IBW/kg (Calculated) : 38.6 Heparin Dosing Weight: 56 kg  Vital Signs: Temp: 98.1 F (36.7 C) (09/11 1852) Temp Source: Oral (09/11 1852) BP: 191/59 (09/11 1852) Pulse Rate: 50 (09/11 1852)  Labs: Recent Labs    09/15/17 1157 09/15/17 1212  HGB 13.0 15.0  HCT 43.6 44.0  PLT 263  --   APTT 26  --   LABPROT 12.5  --   INR 0.94  --   CREATININE 0.75 0.70    Estimated Creatinine Clearance: 47.8 mL/min (by C-G formula based on SCr of 0.7 mg/dL).  Assessment: CC/HPI: 72 yo f with multiple weeks of stroke like symptoms - CT head neck shows likely thrombus in carotid   Anticoag: none pta - iv hep for stroke - low goal, no boluses  Renal: SCr 0.7  Heme/Onc: H&H 15/44, plt 263  Goal of Therapy:  Heparin level 0.3 - 0.5 units/ml Monitor platelets by anticoagulation protocol: Yes   Plan:  Heparin 650 units/hr Initial HL 0500 Daily HL CBC F/U s/sx of bleeding  Isaac Bliss, PharmD, BCPS, BCCCP Clinical Pharmacist [phone]  Please check AMION for all [facility] numbers  09/15/2017 8:26 PM

## 2017-09-15 NOTE — ED Notes (Signed)
Patient transported to MRI 

## 2017-09-15 NOTE — Progress Notes (Signed)
Re: Carotid duplex  MRA neck done 9/11, please advise if carotid still needed.   [phone] vascular lab

## 2017-09-15 NOTE — Progress Notes (Signed)
CTA head neck done Received call from neuroradiology Less likely an aneurysm in the right MCA bifurcation. More like an eccentric thrombus within the distal right common carotid artery with filling defect protruding into the arterial lumen, findings suspect a recently ruptured plaque with thrombus formation.  This may be the source of embolization.  Update recommendations In addition to prior comminution, will start patient on heparin stroke protocol no bolus. There is increased risk of bleed but at this time benefits of heparinization with a possible soft clot outweighed the risk.  -- Milon Dikes, MD [facility] Pager: [phone] If 7pm to 7am, please call on call as listed on AMION.

## 2017-09-15 NOTE — Consult Note (Signed)
NEURO HOSPITALIST CONSULT NOTE   Requestig physician: Dr. Jacqulyn Bath Reason for Consult: AMS History obtained from:  Patient   Chart  Patient and Chart    HPI:                                                                                                                                          Sherry Fitzgerald is an 72 y.o. female with a PMH of HTN who presented to the ED with a 2 day history of speech changes, AMS and left face weakness.  Per patient family member these symptoms have been intermittent for weeks, but for the past 2 days the patient has had increasing confusion. She is doing odd things like attempting to make coffee w/o coffee grounds. She is becoming more easily agitated and argues about the time. 2 days ago he noticed that the left side of her face seemed different and that she was not speaking as clearly. Denies any ETOH, or drug abuse. Denies any CP or SOB.   Due to the persistence of symptoms and then becoming more frequent as well as the facial droop, there has been decided to bring the patient to the emergency room for evaluation.  Noncontrast CT of the head was done that showed a hypodensity suggestive of subacute infarct in the right basal ganglia/caudate. Neurological consultation was obtained for further evaluation. Even prior to seeing the patient, an MRI of the brain and MRA of the head was ordered and completed. Details below. LKW: 2-4 day ago; uncertain of time NIHSS: 3 CBG: 107, BUN: 7  No prior history of stroke.   Past Medical History:  Diagnosis Date  . Hypertension    no medications for years    Past Surgical History:  Procedure Laterality Date  . CHOLECYSTECTOMY  2014    Family History  Problem Relation Age of Onset  . CVA Mother   . Heart disease Father 28  . CVA Father          Social History:  reports that she has never smoked. She has never used smokeless tobacco. She reports that she does not drink alcohol or use  drugs.  No Active Allergies  MEDICATIONS:  Current Facility-Administered Medications  Medication Dose Route Frequency Provider Last Rate Last Dose  .  stroke: mapping our early stages of recovery book   Does not apply Once Jonah Blue, MD      . 0.9 %  sodium chloride infusion   Intravenous Continuous Jonah Blue, MD      . acetaminophen (TYLENOL) tablet 650 mg  650 mg Oral Q4H PRN Jonah Blue, MD       Or  . acetaminophen (TYLENOL) solution 650 mg  650 mg Per Tube Q4H PRN Jonah Blue, MD       Or  . acetaminophen (TYLENOL) suppository 650 mg  650 mg Rectal Q4H PRN Jonah Blue, MD      . aspirin suppository 300 mg  300 mg Rectal Daily Jonah Blue, MD       Or  . aspirin tablet 325 mg  325 mg Oral Daily Jonah Blue, MD      . atorvastatin (LIPITOR) tablet 80 mg  80 mg Oral Daily Jonah Blue, MD      . hydrALAZINE (APRESOLINE) injection 5 mg  5 mg Intravenous Q4H PRN Jonah Blue, MD      . senna-docusate (Senokot-S) tablet 1 tablet  1 tablet Oral QHS PRN Jonah Blue, MD       Current Outpatient Medications  Medication Sig Dispense Refill  . amLODipine (NORVASC) 5 MG tablet Take 1 tablet (5 mg total) by mouth daily. (Patient not taking: Reported on 08/26/2017) 90 tablet 3  . atorvastatin (LIPITOR) 20 MG tablet Take 1 tablet (20 mg total) by mouth daily. (Patient not taking: Reported on 08/26/2017) 30 tablet 11  . lisinopril-hydrochlorothiazide (ZESTORETIC) 20-12.5 MG per tablet Take 2 tablets by mouth daily. (Patient not taking: Reported on 09/15/2017) 60 tablet 11  . meloxicam (MOBIC) 15 MG tablet Take 1 tablet (15 mg total) by mouth daily. (Patient not taking: Reported on 09/15/2017) 30 tablet 0   ROS:                                                                                                                                        History obtained from chart review, patient has been  General ROS: negative for - chills, fatigue, fever, night sweats, weight gain or weight loss Psychological ROS: negative for - behavioral disorder, hallucinations, memory difficulties, mood swings or suicidal ideation Ophthalmic ROS: negative for - blurry vision, double vision, eye pain or loss of vision ENT ROS: negative for - epistaxis, nasal discharge, oral lesions, sore throat, tinnitus or vertigo Allergy and Immunology ROS: negative for - hives or itchy/watery eyes Hematological and Lymphatic ROS: negative for - bleeding problems, bruising or swollen lymph nodes Endocrine ROS: negative for - galactorrhea, hair pattern changes, polydipsia/polyuria or temperature intolerance Respiratory ROS: negative for - cough, hemoptysis, shortness of breath or wheezing Cardiovascular ROS: negative for - chest pain, dyspnea on exertion, edema or irregular heartbeat  Gastrointestinal ROS: negative for - abdominal pain, diarrhea, hematemesis, nausea/vomiting or stool incontinence Genito-Urinary ROS: negative for - dysuria, hematuria, incontinence or urinary frequency/urgency Musculoskeletal ROS: negative for - joint swelling or muscular weakness Neurological ROS: as noted in HPI Dermatological ROS: negative for rash and skin lesion changes   Blood pressure (!) 212/93, pulse (!) 50, temperature 97.9 F (36.6 C), temperature source Oral, resp. rate 15, SpO2 100 %.  General Examination:                                                                                                      Physical Exam  HEENT-  Normocephalic, no lesions, without obvious abnormality.  Normal external eye and conjunctiva.   Cardiovascular- S1-S2 audible, pulses palpable throughout   Lungs-no rhonchi or wheezing noted, no excessive working breathing.  Saturations within normal limits Abdomen- All 4 quadrants palpated and nontender Extremities- Warm, dry and  intact Musculoskeletal-no joint tenderness, deformity or swelling Skin-warm and dry, no hyperpigmentation, vitiligo, or suspicious lesions  Neurological Examination Mental status: Awake alert oriented to self, place, month but not year.  She told me her age to be 12 while she is 72 years old. Speech is mildly dysarthric. Naming comprehension and repetition intact Poor attention concentration Cranial nerves: Pupils equal round reactive light, extra ocular movements normal, visual fields full, subtle left lower face droop, facial sensation intact, auditory acuity intact, palate elevates midline, shoulder shrug intact, tongue midline. Motor exam: No drift noted in any of the 4 extremities Sensory exam: Intact light touch all over with no extinction Coordination: Intact finger-nose-finger bilaterally Gait testing was deferred at this time.  NIH stroke scale 1a Level of Conscious.: 0 1b LOC Questions: 1 1c LOC Commands: 0 2 Best Gaze: 0 3 Visual: 0 4 Facial Palsy: 1 5a Motor Arm - left: 0 5b Motor Arm - Right: 0 6a Motor Leg - Left: 0 6b Motor Leg - Right: 0 7 Limb Ataxia: 0 8 Sensory: 0 9 Best Language: 0 10 Dysarthria: 1 11 Extinct. and Inatten.: 0 TOTAL: 3   Lab Results: Basic Metabolic Panel: Recent Labs  Lab 09/15/17 1157 09/15/17 1212  NA 137 138  Sherry 4.2 4.4  CL 102 101  CO2 26  --   GLUCOSE 107* 107*  BUN 7* 9  CREATININE 0.75 0.70  CALCIUM 9.2  --     CBC: Recent Labs  Lab 09/15/17 1157 09/15/17 1212  WBC 4.9  --   NEUTROABS 2.8  --   HGB 13.0 15.0  HCT 43.6 44.0  MCV 75.7*  --   PLT 263  --    Imaging: Ct Head Wo Contrast  Result Date: 09/15/2017 CLINICAL DATA:  Dizziness. Altered mental status and weakness. Memory impairment. EXAM: CT HEAD WITHOUT CONTRAST TECHNIQUE: Contiguous axial images were obtained from the base of the skull through the vertex without intravenous contrast. COMPARISON:  08/25/2017 FINDINGS: Brain: There is acute or subacute  infarction in the region of the right frontal white matter/caudate head. No evidence of hemorrhage or mass effect. The remainder the brain appears unchanged  with a few old frontal white matter infarctions but no other focal finding. No mass lesion, hydrocephalus or extra-axial collection. Vascular: There is atherosclerotic calcification of the major vessels at the base of the brain. Cannot rule out a 5 mm right MCA aneurysm. Skull: Negative Sinuses/Orbits: Clear/normal Other: None IMPRESSION: Newly seen 1-2 cm region of low-density in the right frontal lobe in the region of the deep white matter and caudate head consistent with acute or subacute infarction. No hemorrhage or mass effect. Few other old frontal white matter infarctions as seen previously. Atherosclerotic change of the major vessels at base of the brain. Question 5 mm right MCA region aneurysm. Electronically Signed   By: Paulina Fusi M.D.   On: 09/15/2017 12:52   MRI of the brain reviewed by me personally shows what appears to be a infarct in the deep right matter of the right frontal lobe also extending into the head of the caudate with scattered peripheral cortical infarcts in the right frontal lobe.  On the MRA of the brain, a 5 mm distal right MCA aneurysm is seen.  This aneurysm has T1 shortening and susceptibility suggesting partial thrombosis and might be a source for the embolization that is causing the strokes.  Assessment: 72 year old woman with a history of hypertension comes in for 2 to 4 days history of altered mental status and possible left facial droop. Her husband reports that she has been exhibiting bizarre behavior which is not like herself since Thursday or Saturday of last week. CT scan was concerning for stroke followed by an MRI that showed scattered infarcts from the right caudate to the peripheral cortical areas in the right frontal lobe. MRA brain showed a 5 mm distal right MCA aneurysm with abnormal signal of T1  shortening and susceptibility suggestive of partial thrombosis and possibly being the source of the strokes seen on the MRI brain.  Impression: Acute to subacute ischemic stroke, likely embolic- further work up needed Right MCA aneurysm with partial thrombus  Recommendations: -- BP goal : Permissive HTN upto 220/110 mmHg  --MRI Brain ( completed) --CTA head and neck ( ordered)  --Echocardiogram --antiplatelet -- High intensity Statin if LDL > 70 -- HgbA1c, fasting lipid panel -- PT consult, OT consult, Speech consult --Telemetry monitoring --Frequent neuro checks --Stroke swallow screen  --Discussed case with Dr. Corliss Skains from neuro endovascular who will take the patient for diagnostic cerebral angiogram tomorrow.  Please keep the patient n.p.o. after midnight.  Valentina Lucks, MSN, NP-C [facility] [phone]   Attending Neurohospitalist Addendum Patient seen and examined with APP/Resident. Agree with the history and physical as documented above. Agree with the plan as documented, which I helped formulate. I have independently reviewed the chart, obtained history, review of systems and examined the patient.I have personally reviewed pertinent head/neck/spine imaging (CT/MRI).  I have made the corrections to the note above and edited pertinent sections personally. Please feel free to call with any questions. --- Milon Dikes, MD [facility] Pager: [phone]  If 7pm to 7am, please call on call as listed on AMION.  Patient will be followed by the stroke team in the morning.  Please refer to [email or web address] for on-call. For off hours, please call the on-call neuro hospitalist

## 2017-09-15 NOTE — ED Notes (Signed)
Admitting provider at bedside for evaluation.

## 2017-09-16 ENCOUNTER — Inpatient Hospital Stay (HOSPITAL_COMMUNITY): Payer: Medicare Other

## 2017-09-16 DIAGNOSIS — I63031 Cerebral infarction due to thrombosis of right carotid artery: Secondary | ICD-10-CM

## 2017-09-16 DIAGNOSIS — I10 Essential (primary) hypertension: Secondary | ICD-10-CM

## 2017-09-16 DIAGNOSIS — E782 Mixed hyperlipidemia: Secondary | ICD-10-CM

## 2017-09-16 DIAGNOSIS — R739 Hyperglycemia, unspecified: Secondary | ICD-10-CM

## 2017-09-16 DIAGNOSIS — I5030 Unspecified diastolic (congestive) heart failure: Secondary | ICD-10-CM

## 2017-09-16 LAB — LIPID PANEL
CHOL/HDL RATIO: 5.3 ratio
CHOLESTEROL: 266 mg/dL — AB (ref 0–200)
HDL: 50 mg/dL (ref >40)
LDL Cholesterol: 180 mg/dL — ABNORMAL HIGH (ref 0–99)
TRIGLYCERIDES: 178 mg/dL — AB (ref <150)
VLDL: 36 mg/dL (ref 0–40)

## 2017-09-16 LAB — ECHOCARDIOGRAM COMPLETE
HEIGHTINCHES: 57 in
WEIGHTICAEL: 1982.38 [oz_av]

## 2017-09-16 LAB — HEMOGLOBIN A1C
Hgb A1c MFr Bld: 6.1 % — ABNORMAL HIGH (ref 4.8–5.6)
Mean Plasma Glucose: 128.37 mg/dL

## 2017-09-16 LAB — HEPARIN LEVEL (UNFRACTIONATED)
Heparin Unfractionated: 0.33 IU/mL (ref 0.30–0.70)
Heparin Unfractionated: 0.43 IU/mL (ref 0.30–0.70)

## 2017-09-16 MED ORDER — HYDROCHLOROTHIAZIDE 25 MG PO TABS: 25.0000 mg | ORAL_TABLET | Freq: Every day | ORAL | Status: DC

## 2017-09-16 MED ORDER — LISINOPRIL 20 MG PO TABS
40.0000 mg | ORAL_TABLET | Freq: Every day | ORAL | Status: DC
  Administered 2017-09-16 – 2017-09-18 (×3): 40 mg via ORAL
  Filled 2017-09-16 (×4): qty 2

## 2017-09-16 MED ORDER — POLYETHYLENE GLYCOL 3350 17 G PO PACK
17.0000 g | PACK | Freq: Once | ORAL | Status: DC
  Filled 2017-09-16: qty 1

## 2017-09-16 MED ORDER — WARFARIN - PHARMACIST DOSING INPATIENT: Freq: Every day | Status: DC

## 2017-09-16 MED ORDER — WARFARIN SODIUM 5 MG PO TABS: 5.0000 mg | ORAL_TABLET | Freq: Once | ORAL | Status: AC

## 2017-09-16 MED ORDER — PATIENT'S GUIDE TO USING COUMADIN BOOK
Freq: Once | Status: AC
  Administered 2017-09-17: 18:00:00
  Filled 2017-09-16: qty 1

## 2017-09-16 NOTE — Progress Notes (Signed)
[facility]                                                                              Patient Demographics  Sherry Fitzgerald, is a 72 y.o. female, DOB - [birth year], ZOX:[id number]  Admit date - 09/15/2017   Admitting Physician Jonah Blue, MD  Outpatient Primary MD for the patient is Myrene Buddy, MD  Outpatient specialists:   LOS - 1  days   Medical records reviewed and are as summarized below:    Chief Complaint  Patient presents with  . Altered Mental Status       Brief summary   Patient is a 72 year old female with hypertension presented to ED with altered mental status.  At baseline, she is very functionally active however in the last week she was having intermittent increasing confusion, worse in the last 2 days.  Her husband noticed facial drooping 2 days ago and patient was not speaking clearly.  CT head in ED showed subacute infarct in the right basal ganglia/caudate.  Patient was admitted for further work-up of stroke.   Assessment & Plan    Principal Problem: Acute CVA (cerebral vascular accident) (HCC) -Presented with intermittent confusion, facial drooping, dysarthria -CT head showed 1 to 2 cm region of low density in the right frontal lobe in the region of deep white matter and caudate head consistent with acute or subacute infarction, few other old frontal matter infarctions as seen previously -Brain showed acute infarct in the deep white matter of right frontal lobe extending into the head of caudate on the right, scattered small peripheral cortical infarct in the right frontal lobe.  5 mm distal right MCA aneurysm.  Appears to be an occluded branch of the right frontal lobe from the right MCA -Patient underwent CTA head and neck which was negative for large vessel occlusion.  Eccentric thrombus within the distal right common carotid artery with thin filling defect protruding into the arterial lumen, suspected to reflect source of  embolization. No aneurysm identified at the right MCA bifurcation. -Neurology was consulted and recommended heparin drip and  Neuro discussed with Dr. Corliss Skains for possible cerebral angiogram -Follow 2D echo -LDL 180, placed on statin hemoglobin A1c 6.1 -Currently on IV heparin drip, will follow stroke service recommendations   Active Problems:   Hyperlipidemia -LDL 180, placed on high-dose statin Lipitor 80 mg daily    HYPERTENSION, BENIGN SYSTEMIC -Currently on lisinopril, hydralazine as needed with parameters -BP currently stable   Code Status: Full CODE STATUS DVT Prophylaxis: Heparin drip Family Communication: Discussed in detail with the patient, all imaging results, lab results explained to the patient and husband at the bedside   Disposition Plan: Await further recommendations from neurology, 2D echo  Time Spent in minutes 25 minutes  Procedures:  CT angiogram head and neck, MRI brain  Consultants:   Neurology  Antimicrobials:      Medications  Scheduled Meds: . atorvastatin  80 mg Oral Daily  . lisinopril  40 mg Oral Daily  . polyethylene glycol  17 g Oral Once   Continuous Infusions: . sodium chloride 50 mL/hr at 09/16/17 0600  .  heparin 650 Units/hr (09/16/17 0600)   PRN Meds:.acetaminophen **OR** acetaminophen (TYLENOL) oral liquid 160 mg/5 mL **OR** acetaminophen, hydrALAZINE, senna-docusate   Antibiotics   Anti-infectives (From admission, onward)   None        Subjective:   Sherry Fitzgerald was seen and examined today.  Feels somewhat better today, confirmed by the husband at bedside.  Dysarthria improving.  No facial drooping at the time of my examination.  Patient denies dizziness, chest pain, shortness of breath, abdominal pain, N/V/D/C, new weakness, numbess, tingling.  Objective:   Vitals:   09/16/17 0300 09/16/17 0420 09/16/17 0500 09/16/17 0900  BP: (!) 126/100  (!) 109/93 (!) 152/65  Pulse: (!) 44 (!) 49 (!) 44   Resp: 15 14  16    Temp:  97.6 F (36.4 C)  97.8 F (36.6 C)  TempSrc:  Oral  Oral  SpO2: 94%  94%   Weight:      Height:        Intake/Output Summary (Last 24 hours) at 09/16/2017 1042 Last data filed at 09/16/2017 0600 Gross per 24 hour  Intake 958.31 ml  Output -  Net 958.31 ml     Wt Readings from Last 3 Encounters:  09/15/17 56.2 kg  08/25/17 55.3 kg  07/30/14 55.3 kg     Exam  General: Alert and oriented x 3, NAD, dysarthria improving  Eyes: PERRLA, EOMI, Anicteric Sclera,  HEENT:  Atraumatic, normocephalic, normal oropharynx  Cardiovascular: S1 S2 auscultated, no rubs, murmurs or gallops. Regular rate and rhythm.  Respiratory: Clear to auscultation bilaterally, no wheezing, rales or rhonchi  Gastrointestinal: Soft, nontender, nondistended, + bowel sounds  Ext: no pedal edema bilaterally  Neuro: AAOx3, Cr N's II- XII. Strength 5/5 upper and lower extremities bilaterally  Musculoskeletal: No digital cyanosis, clubbing  Skin: No rashes  Psych: Normal affect and demeanor, alert and oriented x3    Data Reviewed:  I have personally reviewed following labs and imaging studies  Micro Results No results found for this or any previous visit (from the past 240 hour(s)).  Radiology Reports Ct Angio Head W Or Wo Contrast  Result Date: 09/15/2017 CLINICAL DATA:  Initial evaluation for acute stroke. EXAM: CT ANGIOGRAPHY HEAD AND NECK TECHNIQUE: Multidetector CT imaging of the head and neck was performed using the standard protocol during bolus administration of intravenous contrast. Multiplanar CT image reconstructions and MIPs were obtained to evaluate the vascular anatomy. Carotid stenosis measurements (when applicable) are obtained utilizing NASCET criteria, using the distal internal carotid diameter as the denominator. CONTRAST:  50mL ISOVUE-370 IOPAMIDOL (ISOVUE-370) INJECTION 76% COMPARISON:  Prior MRI from earlier same day. FINDINGS: CTA NECK FINDINGS Aortic arch: Aortic  arch of normal caliber with normal branch pattern. Mild atheromatous plaque about the arch and origin of the great vessels without stenosis. Visualized subclavian arteries widely patent. Right carotid system: Right common carotid artery patent from its origin to the bifurcation without hemodynamically significant stenosis. There is focal eccentric thrombus within the distal right common carotid artery with a thin wisp/linear filling defect extending distally into the carotid lumen (series 7, image 189, 185). Findings suspected to reflect sequelae of a recently ruptured plaque with thrombus formation. This extends into the proximal right ICA. No frank raised dissection flap. No significant atheromatous narrowing about the adjacent right bifurcation. Right ICA widely patent distally without abnormality. Left carotid system: Left common carotid artery patent from its origin to the bifurcation without stenosis. A centric calcified plaque about the left bifurcation  without hemodynamically significant stenosis. Left ICA widely patent distally to the skull base without stenosis, dissection or occlusion. Vertebral arteries: Both of the vertebral arteries arise from the subclavian arteries. Left vertebral artery dominant and widely patent within the neck. Right vertebral artery hypoplastic and essentially occludes just distal to its origin. Right vertebral artery remains occluded within the neck. Skeleton: Unremarkable. Other neck: 1.9 cm right thyroid nodule. Upper chest: Unremarkable. Review of the MIP images confirms the above findings CTA HEAD FINDINGS Anterior circulation: Petrous segments patent bilaterally without stenosis. Scattered multifocal atheromatous plaque within the cavernous/supraclinoid ICAs with moderate multifocal stenoses. ICA termini patent. A1 segments, anterior communicating artery common anterior cerebral arteries widely patent. Left M1 widely patent. Normal left MCA bifurcation. No proximal left M2  occlusion. Distal left MCA branches well perfused. Right M1 patent to its distal aspect without stenosis. Right MCA bifurcation within normal limits with no aneurysm seen by CTA. Previously noted abnormality on prior MRA most likely reflected venous artifact. No proximal right M2 occlusion. Distal right MCA branches well perfused. Distal small vessel atheromatous irregularity. Posterior circulation: Dominant left vertebral artery patent to the vertebrobasilar junction without stenosis. Distal reconstitution of the hypoplastic right vertebral artery with diffusely irregular flow within the right V4 segment. Left PICA patent. Basilar artery widely patent to its distal aspect without stenosis. Superior cerebellar arteries patent bilaterally. Both of the PCAs supplied via the basilar. Diffuse atheromatous irregularity throughout the PCAs which are patent to their distal aspects. Venous sinuses: Not well assessed due to timing the contrast bolus, but grossly patent. Anatomic variants: None significant. Delayed phase: Evolving right basal ganglia infarcts noted. No abnormal enhancement. Review of the MIP images confirms the above findings IMPRESSION: 1. Negative CTA for large vessel occlusion. 2. Eccentric thrombus within the distal right common carotid artery with thin filling defect protruding into the arterial lumen. Findings suspected to reflect a recently ruptured plaque with thrombus formation. This is acute in appearance, and suspected to reflect source of embolization. 3. No aneurysm identified at the right MCA bifurcation. In retrospect, signal abnormality on previous MRA felt to be secondary to venous artifact/contamination. 4. Diffusely diseased and largely occluded right vertebral artery. Dominant left vertebral artery widely patent. 5. Moderate to advanced carotid siphon atherosclerotic change. Findings communicated to Dr. Wilford Corner at 7 p.m. on 09/15/2017. Electronically Signed   By: Rise Mu M.D.    On: 09/15/2017 19:03   Ct Head Wo Contrast  Result Date: 09/15/2017 CLINICAL DATA:  Dizziness. Altered mental status and weakness. Memory impairment. EXAM: CT HEAD WITHOUT CONTRAST TECHNIQUE: Contiguous axial images were obtained from the base of the skull through the vertex without intravenous contrast. COMPARISON:  08/25/2017 FINDINGS: Brain: There is acute or subacute infarction in the region of the right frontal white matter/caudate head. No evidence of hemorrhage or mass effect. The remainder the brain appears unchanged with a few old frontal white matter infarctions but no other focal finding. No mass lesion, hydrocephalus or extra-axial collection. Vascular: There is atherosclerotic calcification of the major vessels at the base of the brain. Cannot rule out a 5 mm right MCA aneurysm. Skull: Negative Sinuses/Orbits: Clear/normal Other: None IMPRESSION: Newly seen 1-2 cm region of low-density in the right frontal lobe in the region of the deep white matter and caudate head consistent with acute or subacute infarction. No hemorrhage or mass effect. Few other old frontal white matter infarctions as seen previously. Atherosclerotic change of the major vessels at base of the brain. Question 5  mm right MCA region aneurysm. Electronically Signed   By: Paulina Fusi M.D.   On: 09/15/2017 12:52   Ct Head Wo Contrast  Result Date: 08/25/2017 CLINICAL DATA:  Dizziness with vomiting EXAM: CT HEAD WITHOUT CONTRAST TECHNIQUE: Contiguous axial images were obtained from the base of the skull through the vertex without intravenous contrast. COMPARISON:  January 13, 2010 FINDINGS: Brain: The ventricles are normal in size and configuration. There is no intracranial mass, hemorrhage, extra-axial fluid collection, or midline shift. There is mild patchy small vessel disease in the centra semiovale bilaterally. Elsewhere gray-white compartments appear normal. No acute infarct is evident. Vascular: No hyperdense vessel. There  is calcification in each carotid siphon and distal vertebral artery. Skull: The bony calvarium appears intact. Sinuses/Orbits: There is mucosal thickening in multiple ethmoid air cells. Other visualized paranasal sinuses are clear. Orbits appear symmetric bilaterally. Other: Mastoid air cells are clear. IMPRESSION: Slight periventricular small vessel disease. No acute infarct. No mass or hemorrhage. There are foci of arterial vascular calcification. There is mucosal thickening in multiple ethmoid air cells. Electronically Signed   By: Bretta Bang III M.D.   On: 08/25/2017 19:10   Ct Angio Neck W Or Wo Contrast  Result Date: 09/15/2017 CLINICAL DATA:  Initial evaluation for acute stroke. EXAM: CT ANGIOGRAPHY HEAD AND NECK TECHNIQUE: Multidetector CT imaging of the head and neck was performed using the standard protocol during bolus administration of intravenous contrast. Multiplanar CT image reconstructions and MIPs were obtained to evaluate the vascular anatomy. Carotid stenosis measurements (when applicable) are obtained utilizing NASCET criteria, using the distal internal carotid diameter as the denominator. CONTRAST:  50mL ISOVUE-370 IOPAMIDOL (ISOVUE-370) INJECTION 76% COMPARISON:  Prior MRI from earlier same day. FINDINGS: CTA NECK FINDINGS Aortic arch: Aortic arch of normal caliber with normal branch pattern. Mild atheromatous plaque about the arch and origin of the great vessels without stenosis. Visualized subclavian arteries widely patent. Right carotid system: Right common carotid artery patent from its origin to the bifurcation without hemodynamically significant stenosis. There is focal eccentric thrombus within the distal right common carotid artery with a thin wisp/linear filling defect extending distally into the carotid lumen (series 7, image 189, 185). Findings suspected to reflect sequelae of a recently ruptured plaque with thrombus formation. This extends into the proximal right ICA. No  frank raised dissection flap. No significant atheromatous narrowing about the adjacent right bifurcation. Right ICA widely patent distally without abnormality. Left carotid system: Left common carotid artery patent from its origin to the bifurcation without stenosis. A centric calcified plaque about the left bifurcation without hemodynamically significant stenosis. Left ICA widely patent distally to the skull base without stenosis, dissection or occlusion. Vertebral arteries: Both of the vertebral arteries arise from the subclavian arteries. Left vertebral artery dominant and widely patent within the neck. Right vertebral artery hypoplastic and essentially occludes just distal to its origin. Right vertebral artery remains occluded within the neck. Skeleton: Unremarkable. Other neck: 1.9 cm right thyroid nodule. Upper chest: Unremarkable. Review of the MIP images confirms the above findings CTA HEAD FINDINGS Anterior circulation: Petrous segments patent bilaterally without stenosis. Scattered multifocal atheromatous plaque within the cavernous/supraclinoid ICAs with moderate multifocal stenoses. ICA termini patent. A1 segments, anterior communicating artery common anterior cerebral arteries widely patent. Left M1 widely patent. Normal left MCA bifurcation. No proximal left M2 occlusion. Distal left MCA branches well perfused. Right M1 patent to its distal aspect without stenosis. Right MCA bifurcation within normal limits with no aneurysm seen by CTA.  Previously noted abnormality on prior MRA most likely reflected venous artifact. No proximal right M2 occlusion. Distal right MCA branches well perfused. Distal small vessel atheromatous irregularity. Posterior circulation: Dominant left vertebral artery patent to the vertebrobasilar junction without stenosis. Distal reconstitution of the hypoplastic right vertebral artery with diffusely irregular flow within the right V4 segment. Left PICA patent. Basilar artery widely  patent to its distal aspect without stenosis. Superior cerebellar arteries patent bilaterally. Both of the PCAs supplied via the basilar. Diffuse atheromatous irregularity throughout the PCAs which are patent to their distal aspects. Venous sinuses: Not well assessed due to timing the contrast bolus, but grossly patent. Anatomic variants: None significant. Delayed phase: Evolving right basal ganglia infarcts noted. No abnormal enhancement. Review of the MIP images confirms the above findings IMPRESSION: 1. Negative CTA for large vessel occlusion. 2. Eccentric thrombus within the distal right common carotid artery with thin filling defect protruding into the arterial lumen. Findings suspected to reflect a recently ruptured plaque with thrombus formation. This is acute in appearance, and suspected to reflect source of embolization. 3. No aneurysm identified at the right MCA bifurcation. In retrospect, signal abnormality on previous MRA felt to be secondary to venous artifact/contamination. 4. Diffusely diseased and largely occluded right vertebral artery. Dominant left vertebral artery widely patent. 5. Moderate to advanced carotid siphon atherosclerotic change. Findings communicated to Dr. Wilford Corner at 7 p.m. on 09/15/2017. Electronically Signed   By: Rise Mu M.D.   On: 09/15/2017 19:03   Mr Maxine Glenn Head Wo Contrast  Result Date: 09/15/2017 CLINICAL DATA:  Stroke. Left facial weakness. Speech change. Abnormal CT. EXAM: MRI HEAD WITHOUT AND WITH CONTRAST MRA HEAD WITHOUT CONTRAST MRA NECK WITHOUT AND WITH CONTRAST TECHNIQUE: Multiplanar, multiecho pulse sequences of the brain and surrounding structures were obtained without and with intravenous contrast. Angiographic images of the Circle of Willis were obtained using MRA technique without intravenous contrast. Angiographic images of the neck were obtained using MRA technique without and with intravenous contrast. Carotid stenosis measurements (when  applicable) are obtained utilizing NASCET criteria, using the distal internal carotid diameter as the denominator. CONTRAST:  5 mL Gadovist IV COMPARISON:  CT head 09/15/2017 FINDINGS: MRI HEAD FINDINGS Brain: Acute infarct right frontal white matter. Adjacent scattered small white matter and cortical infarcts also in the right frontal lobe. Small acute infarct in the right caudate. This corresponds to the hypodensity on CT. No other areas of acute infarct elsewhere in the brain. Ventricle size normal. Mild chronic microvascular ischemic change in the white matter. Chronic microhemorrhage in the right lateral thalamus and left putamen. Blood products associated with right MCA aneurysm possibly due to partial thrombosis of aneurysm. No evidence of subarachnoid hemorrhage. Vascular: Normal arterial flow voids. There is a right MCA aneurysm with associated susceptibility suggesting thrombosis or possibly clot around the aneurysm. Skull and upper cervical spine: Negative Sinuses/Orbits: Negative Other: None MRA HEAD FINDINGS Left vertebral artery is widely patent and supplies the basilar. Minimal if any flow distal right vertebral artery. Left PICA patent. AICA patent bilaterally. Superior cerebellar and posterior cerebral arteries widely patent bilaterally. Mild atherosclerotic irregularity in the cavernous carotid bilaterally. Anterior cerebral arteries widely patent. Left middle cerebral artery widely patent 5 mm aneurysm right MCA bifurcation projecting superiorly. There is susceptibility associated with this aneurysm. High signal on T1 within the aneurysm. The aneurysm contour is irregular raising the possibility of recent thrombus within the aneurysm. Anterior branch of the right middle cerebral artery supplying the right frontal lobe is  not seen on the right but is on the left and may be occluded or have slow flow. MRA NECK FINDINGS Antegrade flow in both carotid arteries and left vertebral artery. Minimal flow  in the right vertebral artery. Left vertebral artery dominant and widely patent to the basilar. Right vertebral artery is very small and diffusely disease but appears to have minimal contribution to the basilar. Carotid bifurcation patent bilaterally without stenosis. IMPRESSION: 1. Acute infarct in the deep white matter of the right frontal lobe also extending into the head of the caudate on the right. Scattered small peripheral cortical infarcts in the right frontal lobe 2. 5 mm distal right MCA aneurysm. Aneurysm has an unusual configuration with high signal on T1 and susceptibility suggesting partial thrombosis. No evidence of subarachnoid hemorrhage on CT or MRI. Given the proximity of the acute infarct, this raises the possibility of thrombi from the aneurysm. In addition, there appears to be an occluded branch of the right frontal lobe from the right MCA. Recommend CTA head for further evaluation. 3. Both carotid arteries widely patent. Left vertebral artery widely patent. Small diffusely disease right vertebral artery. 4. These results were called by telephone at the time of interpretation on 09/15/2017 at 4:34 pm to Dr. Jerrell Belfast, who verbally acknowledged these results. Electronically Signed   By: Marlan Palau M.D.   On: 09/15/2017 16:35   Mr Angiogram Neck W Or Wo Contrast  Result Date: 09/15/2017 CLINICAL DATA:  Stroke. Left facial weakness. Speech change. Abnormal CT. EXAM: MRI HEAD WITHOUT AND WITH CONTRAST MRA HEAD WITHOUT CONTRAST MRA NECK WITHOUT AND WITH CONTRAST TECHNIQUE: Multiplanar, multiecho pulse sequences of the brain and surrounding structures were obtained without and with intravenous contrast. Angiographic images of the Circle of Willis were obtained using MRA technique without intravenous contrast. Angiographic images of the neck were obtained using MRA technique without and with intravenous contrast. Carotid stenosis measurements (when applicable) are obtained utilizing NASCET  criteria, using the distal internal carotid diameter as the denominator. CONTRAST:  5 mL Gadovist IV COMPARISON:  CT head 09/15/2017 FINDINGS: MRI HEAD FINDINGS Brain: Acute infarct right frontal white matter. Adjacent scattered small white matter and cortical infarcts also in the right frontal lobe. Small acute infarct in the right caudate. This corresponds to the hypodensity on CT. No other areas of acute infarct elsewhere in the brain. Ventricle size normal. Mild chronic microvascular ischemic change in the white matter. Chronic microhemorrhage in the right lateral thalamus and left putamen. Blood products associated with right MCA aneurysm possibly due to partial thrombosis of aneurysm. No evidence of subarachnoid hemorrhage. Vascular: Normal arterial flow voids. There is a right MCA aneurysm with associated susceptibility suggesting thrombosis or possibly clot around the aneurysm. Skull and upper cervical spine: Negative Sinuses/Orbits: Negative Other: None MRA HEAD FINDINGS Left vertebral artery is widely patent and supplies the basilar. Minimal if any flow distal right vertebral artery. Left PICA patent. AICA patent bilaterally. Superior cerebellar and posterior cerebral arteries widely patent bilaterally. Mild atherosclerotic irregularity in the cavernous carotid bilaterally. Anterior cerebral arteries widely patent. Left middle cerebral artery widely patent 5 mm aneurysm right MCA bifurcation projecting superiorly. There is susceptibility associated with this aneurysm. High signal on T1 within the aneurysm. The aneurysm contour is irregular raising the possibility of recent thrombus within the aneurysm. Anterior branch of the right middle cerebral artery supplying the right frontal lobe is not seen on the right but is on the left and may be occluded or have slow flow.  MRA NECK FINDINGS Antegrade flow in both carotid arteries and left vertebral artery. Minimal flow in the right vertebral artery. Left  vertebral artery dominant and widely patent to the basilar. Right vertebral artery is very small and diffusely disease but appears to have minimal contribution to the basilar. Carotid bifurcation patent bilaterally without stenosis. IMPRESSION: 1. Acute infarct in the deep white matter of the right frontal lobe also extending into the head of the caudate on the right. Scattered small peripheral cortical infarcts in the right frontal lobe 2. 5 mm distal right MCA aneurysm. Aneurysm has an unusual configuration with high signal on T1 and susceptibility suggesting partial thrombosis. No evidence of subarachnoid hemorrhage on CT or MRI. Given the proximity of the acute infarct, this raises the possibility of thrombi from the aneurysm. In addition, there appears to be an occluded branch of the right frontal lobe from the right MCA. Recommend CTA head for further evaluation. 3. Both carotid arteries widely patent. Left vertebral artery widely patent. Small diffusely disease right vertebral artery. 4. These results were called by telephone at the time of interpretation on 09/15/2017 at 4:34 pm to Dr. Jerrell BelfastAurora, who verbally acknowledged these results. Electronically Signed   By: Marlan Palauharles  Clark M.D.   On: 09/15/2017 16:35   Mr Brain Wo Contrast  Result Date: 09/15/2017 CLINICAL DATA:  Stroke. Left facial weakness. Speech change. Abnormal CT. EXAM: MRI HEAD WITHOUT AND WITH CONTRAST MRA HEAD WITHOUT CONTRAST MRA NECK WITHOUT AND WITH CONTRAST TECHNIQUE: Multiplanar, multiecho pulse sequences of the brain and surrounding structures were obtained without and with intravenous contrast. Angiographic images of the Circle of Willis were obtained using MRA technique without intravenous contrast. Angiographic images of the neck were obtained using MRA technique without and with intravenous contrast. Carotid stenosis measurements (when applicable) are obtained utilizing NASCET criteria, using the distal internal carotid diameter as  the denominator. CONTRAST:  5 mL Gadovist IV COMPARISON:  CT head 09/15/2017 FINDINGS: MRI HEAD FINDINGS Brain: Acute infarct right frontal white matter. Adjacent scattered small white matter and cortical infarcts also in the right frontal lobe. Small acute infarct in the right caudate. This corresponds to the hypodensity on CT. No other areas of acute infarct elsewhere in the brain. Ventricle size normal. Mild chronic microvascular ischemic change in the white matter. Chronic microhemorrhage in the right lateral thalamus and left putamen. Blood products associated with right MCA aneurysm possibly due to partial thrombosis of aneurysm. No evidence of subarachnoid hemorrhage. Vascular: Normal arterial flow voids. There is a right MCA aneurysm with associated susceptibility suggesting thrombosis or possibly clot around the aneurysm. Skull and upper cervical spine: Negative Sinuses/Orbits: Negative Other: None MRA HEAD FINDINGS Left vertebral artery is widely patent and supplies the basilar. Minimal if any flow distal right vertebral artery. Left PICA patent. AICA patent bilaterally. Superior cerebellar and posterior cerebral arteries widely patent bilaterally. Mild atherosclerotic irregularity in the cavernous carotid bilaterally. Anterior cerebral arteries widely patent. Left middle cerebral artery widely patent 5 mm aneurysm right MCA bifurcation projecting superiorly. There is susceptibility associated with this aneurysm. High signal on T1 within the aneurysm. The aneurysm contour is irregular raising the possibility of recent thrombus within the aneurysm. Anterior branch of the right middle cerebral artery supplying the right frontal lobe is not seen on the right but is on the left and may be occluded or have slow flow. MRA NECK FINDINGS Antegrade flow in both carotid arteries and left vertebral artery. Minimal flow in the right vertebral artery. Left  vertebral artery dominant and widely patent to the basilar.  Right vertebral artery is very small and diffusely disease but appears to have minimal contribution to the basilar. Carotid bifurcation patent bilaterally without stenosis. IMPRESSION: 1. Acute infarct in the deep white matter of the right frontal lobe also extending into the head of the caudate on the right. Scattered small peripheral cortical infarcts in the right frontal lobe 2. 5 mm distal right MCA aneurysm. Aneurysm has an unusual configuration with high signal on T1 and susceptibility suggesting partial thrombosis. No evidence of subarachnoid hemorrhage on CT or MRI. Given the proximity of the acute infarct, this raises the possibility of thrombi from the aneurysm. In addition, there appears to be an occluded branch of the right frontal lobe from the right MCA. Recommend CTA head for further evaluation. 3. Both carotid arteries widely patent. Left vertebral artery widely patent. Small diffusely disease right vertebral artery. 4. These results were called by telephone at the time of interpretation on 09/15/2017 at 4:34 pm to Dr. Jerrell Belfast, who verbally acknowledged these results. Electronically Signed   By: Marlan Palau M.D.   On: 09/15/2017 16:35    Lab Data:  CBC: Recent Labs  Lab 09/15/17 1157 09/15/17 1212  WBC 4.9  --   NEUTROABS 2.8  --   HGB 13.0 15.0  HCT 43.6 44.0  MCV 75.7*  --   PLT 263  --    Basic Metabolic Panel: Recent Labs  Lab 09/15/17 1157 09/15/17 1212  NA 137 138  Sherry 4.2 4.4  CL 102 101  CO2 26  --   GLUCOSE 107* 107*  BUN 7* 9  CREATININE 0.75 0.70  CALCIUM 9.2  --    GFR: Estimated Creatinine Clearance: 47.8 mL/min (by C-G formula based on SCr of 0.7 mg/dL). Liver Function Tests: Recent Labs  Lab 09/15/17 1157  AST 25  ALT 19  ALKPHOS 67  BILITOT 0.6  PROT 7.4  ALBUMIN 3.7   No results for input(s): LIPASE, AMYLASE in the last 168 hours. No results for input(s): AMMONIA in the last 168 hours. Coagulation Profile: Recent Labs  Lab 09/15/17 1157   INR 0.94   Cardiac Enzymes: No results for input(s): CKTOTAL, CKMB, CKMBINDEX, TROPONINI in the last 168 hours. BNP (last 3 results) No results for input(s): PROBNP in the last 8760 hours. HbA1C: Recent Labs    09/16/17 0517  HGBA1C 6.1*   CBG: No results for input(s): GLUCAP in the last 168 hours. Lipid Profile: Recent Labs    09/16/17 0517  CHOL 266*  HDL 50  LDLCALC 180*  TRIG 178*  CHOLHDL 5.3   Thyroid Function Tests: Recent Labs    09/15/17 1933  TSH 2.357   Anemia Panel: No results for input(s): VITAMINB12, FOLATE, FERRITIN, TIBC, IRON, RETICCTPCT in the last 72 hours. Urine analysis:    Component Value Date/Time   COLORURINE STRAW (A) 08/25/2017 2213   APPEARANCEUR CLEAR 08/25/2017 2213   LABSPEC 1.004 (L) 08/25/2017 2213   PHURINE 5.0 08/25/2017 2213   GLUCOSEU NEGATIVE 08/25/2017 2213   HGBUR NEGATIVE 08/25/2017 2213   BILIRUBINUR NEGATIVE 08/25/2017 2213   KETONESUR 5 (A) 08/25/2017 2213   PROTEINUR NEGATIVE 08/25/2017 2213   NITRITE NEGATIVE 08/25/2017 2213   LEUKOCYTESUR NEGATIVE 08/25/2017 2213     Keriana Sarsfield M.D. [facility] 09/16/2017, 10:42 AM  Pager: [phone] Between 7am to 7pm - call Pager - [phone]  After 7pm go to [email or web address] - password [email or web address]  Call night coverage person covering  after 7pm

## 2017-09-16 NOTE — Progress Notes (Signed)
ANTICOAGULATION CONSULT NOTE - Follow Up Consult  Pharmacy Consult for Heparin Indication: CVA with carotid thrombus  No Active Allergies  Patient Measurements: Height: 4\' 9"  (144.8 cm) Weight: 123 lb 14.4 oz (56.2 kg) IBW/kg (Calculated) : 38.6 Heparin Dosing Weight:    Vital Signs: Temp: 97.8 F (36.6 C) (09/12 0900) Temp Source: Oral (09/12 0900) BP: 152/65 (09/12 0900) Pulse Rate: 44 (09/12 0500)  Labs: Recent Labs    09/15/17 1157 09/15/17 1212 09/16/17 0517 09/16/17 1357  HGB 13.0 15.0  --   --   HCT 43.6 44.0  --   --   PLT 263  --   --   --   APTT 26  --   --   --   LABPROT 12.5  --   --   --   INR 0.94  --   --   --   HEPARINUNFRC  --   --  0.33 0.43  CREATININE 0.75 0.70  --   --     Estimated Creatinine Clearance: 47.8 mL/min (by C-G formula based on SCr of 0.7 mg/dL).   Assessment: Anticoag:Carotid thrombus.No anticoag PTA. IV hep for stroke symptoms- low goal, no boluses. HL 0.33. Last Hgb 15.Confirmatory HL 0.43 remains in goal range.  Goal of Therapy:  Heparin level 0.3-0.7 units/ml Monitor platelets by anticoagulation protocol: Yes   Plan:  Heparin 650 units/hr Daily HL CBC Recheck HL in 6 hrs  Anuradha Chabot S. Merilynn Finlandobertson, PharmD, BCPS Clinical Staff Pharmacist Pager [phone]  Misty Stanleyobertson, Arneisha Kincannon Stillinger 09/16/2017,2:35 PM

## 2017-09-16 NOTE — Progress Notes (Signed)
  Echocardiogram 2D Echocardiogram has been performed.  Sherry Fitzgerald 09/16/2017, 12:19 PM

## 2017-09-16 NOTE — Progress Notes (Signed)
PT Cancellation Note  Patient Details Name: Sherry Fitzgerald MRN: [id number] DOB: [birth year]   Cancelled Treatment:    Reason Eval/Treat Not Completed: Patient not medically ready heparin initiated last night at 8:25 pm due to thrombosis in Rt MCA. Per protocol, will wait 24 hours prior to PT evaluation or until Heparin levels are therapeutic. Will follow.   Blake DivineShauna A Evey Mcmahan 09/16/2017, 7:18 AM Mylo RedShauna Courtni Balash, PT, DPT Acute Rehabilitation Services Pager [phone] Office [phone]

## 2017-09-16 NOTE — Evaluation (Signed)
Occupational Therapy Evaluation Patient Details Name: Sherry Fitzgerald MRN: [id number] DOB: December 17, 1945 Today's Date: 09/16/2017    History of Present Illness Pt is a 72 y/o female admitted 09/15/2017 with AMS, facial droop and dysarthria.  Imaging positive for R frontal lobe infarct, few old frontal WM infarcts, R MCA aneurysm, partial thrombosis, occulded branch R frontal lobe.  No signficant PMH.    Clinical Impression   PTA patient reports she was very active and independent (+driving).  Admitted for above and limited by impaired cognition, safety awareness, generalized weakness, and balance. She currently requires setup assist for UB ADL, min guard for LB ADL, min guard for toileting/toilet transfers, and min guard for short distance func mobility in room.  She was disoriented to time and required increased time to follow multiple step commands (at time requiring max verbal/tactile cueing).  She will benefit from continued OT services while admitted in order to optimize independence with ADLs and functional mobility, and at this time recommend continued HHOT at discharge in order to return to [facility].  Will continue to follow and update recommendations as needed.     Follow Up Recommendations  Home health OT;Supervision/Assistance - 24 hour    Equipment Recommendations  None recommended by OT    Recommendations for Other Services PT consult     Precautions / Restrictions Precautions Precautions: Fall Restrictions Weight Bearing Restrictions: No      Mobility Bed Mobility Overal bed mobility: Needs Assistance Bed Mobility: Supine to Sit;Sit to Supine     Supine to sit: Supervision Sit to supine: Supervision   General bed mobility comments: supervision for safety, increased time required  Transfers Overall transfer level: Needs assistance Equipment used: None Transfers: Sit to/from Stand Sit to Stand: Min guard         General transfer comment: min guard for safety and  balance    Balance Overall balance assessment: Mild deficits observed, not formally tested                                         ADL either performed or assessed with clinical judgement   ADL Overall ADL's : Needs assistance/impaired     Grooming: Wash/dry hands;Oral care;Min guard;Standing   Upper Body Bathing: Set up;Supervision/ safety;Sitting   Lower Body Bathing: Min guard;Sit to/from stand   Upper Body Dressing : Set up;Sitting   Lower Body Dressing: Min guard;Sit to/from stand   Toilet Transfer: Min guard;Ambulation;Grab Paramedic Details (indicate cue type and reason): min guard for safety Toileting- Clothing Manipulation and Hygiene: Min guard;Sit to/from stand       Functional mobility during ADLs: Min guard General ADL Comments: min guard for safety and balance     Vision Patient Visual Report: No change from baseline Vision Assessment?: Yes Eye Alignment: Within Functional Limits Ocular Range of Motion: Within Functional Limits Alignment/Gaze Preference: Within Defined Limits Tracking/Visual Pursuits: Able to track stimulus in all quads without difficulty Additional Comments: no apparent visual deficits, visual scanning in environment to locate items and read without cueing     Perception     Praxis      Pertinent Vitals/Pain Pain Assessment: No/denies pain     Hand Dominance Right   Extremity/Trunk Assessment Upper Extremity Assessment Upper Extremity Assessment: Overall WFL for tasks assessed   Lower Extremity Assessment Lower Extremity Assessment: Defer to PT evaluation  Communication Communication Communication: No difficulties   Cognition Arousal/Alertness: Awake/alert Behavior During Therapy: WFL for tasks assessed/performed Overall Cognitive Status: Impaired/Different from baseline Area of Impairment: Orientation;Memory;Following commands;Safety/judgement;Awareness;Problem solving                  Orientation Level: Disoriented to;Time   Memory: Decreased short-term memory Following Commands: Follows multi-step commands inconsistently;Follows multi-step commands with increased time Safety/Judgement: Decreased awareness of safety;Decreased awareness of deficits Awareness: Emergent Problem Solving: Slow processing;Decreased initiation;Requires verbal cues     General Comments  family present and supportive    Exercises     Shoulder Instructions      Home Living Family/patient expects to be discharged to:: Private residence Living Arrangements: Spouse/significant other Available Help at Discharge: Family;Available 24 hours/day Type of Home: Apartment Home Access: Level entry     Home Layout: One level     Bathroom Shower/Tub: Chief Strategy OfficerTub/shower unit   Bathroom Toilet: Standard     Home Equipment: None          Prior Functioning/Environment Level of Independence: Independent        Comments: indepedent ADLs, IADLs and driving         OT Problem List: Decreased activity tolerance;Impaired balance (sitting and/or standing);Decreased cognition;Decreased safety awareness;Decreased knowledge of precautions      OT Treatment/Interventions: Self-care/ADL training;Therapeutic exercise;Energy conservation;DME and/or AE instruction;Balance training;Patient/family education;Cognitive remediation/compensation;Therapeutic activities    OT Goals(Current goals can be found in the care plan section) Acute Rehab OT Goals Patient Stated Goal: to get home  OT Goal Formulation: With patient Time For Goal Achievement: 09/30/17 Potential to Achieve Goals: Good  OT Frequency: Min 2X/week   Barriers to D/C:            Co-evaluation              AM-PAC PT "6 Clicks" Daily Activity     Outcome Measure Help from another person eating meals?: None Help from another person taking care of personal grooming?: A Little Help from another person toileting,  which includes using toliet, bedpan, or urinal?: A Little Help from another person bathing (including washing, rinsing, drying)?: A Little Help from another person to put on and taking off regular upper body clothing?: None Help from another person to put on and taking off regular lower body clothing?: A Little 6 Click Score: 20   End of Session Equipment Utilized During Treatment: Gait belt Nurse Communication: Mobility status  Activity Tolerance: Patient tolerated treatment well Patient left: in bed;with call bell/phone within reach;with family/visitor present  OT Visit Diagnosis: Unsteadiness on feet (R26.81);Other symptoms and signs involving cognitive function                Time: 1410-1439 OT Time Calculation (min): 29 min Charges:  OT General Charges $OT Visit: 1 Visit OT Evaluation $OT Eval Moderate Complexity: 1 Mod OT Treatments $Self Care/Home Management : 8-22 mins  Chancy Milroyhristie S Chanson Teems, OT Acute Rehabilitation Services Pager [phone] Office [phone]   Chancy MilroyChristie S Nakyah Erdmann 09/16/2017, 5:04 PM

## 2017-09-16 NOTE — Evaluation (Signed)
Speech Language Pathology Evaluation Patient Details Name: Sherry Fitzgerald MRN: [id number] DOB: 10/07/1945 Today's Date: 09/16/2017 Time: [id number] SLP Time Calculation (min) (ACUTE ONLY): 45 min  Problem List:  Patient Active Problem List   Diagnosis Date Noted  . CVA (cerebral vascular accident) (HCC) 09/15/2017  . Hyperglycemia 09/15/2017  . Health care maintenance 07/30/2014  . Chest wall pain 07/27/2012  . Right leg weakness 07/28/2011  . Cough 02/09/2011  . Knee pain, bilateral 01/21/2011  . Right cervical radiculopathy at C5 11/12/2010  . Left lumbar radiculopathy 10/27/2010  . Plantar fasciitis 10/27/2010  . Hyperlipidemia 03/04/2006  . HYPERTENSION, BENIGN SYSTEMIC 03/04/2006   Past Medical History:  Past Medical History:  Diagnosis Date  . Hypertension    no medications for years   Past Surgical History:  Past Surgical History:  Procedure Laterality Date  . CHOLECYSTECTOMY  2014   HPI:  Sherry Fitzgerald is a 72 y.o. female with PMH of HTN. Per chart, pt admitted to ED 09/15/17 with changes in speech and left facial droop. Her husband reported pt has exhibited increased confusion and inability to comprehend questions over last 2 days. MRI revealed acute infarct in deep white matter of the right frontal lobe also extending into the head of the caudate on the right as well as scattered small peripheral cortical infarcts in the right frontal lobe 2. 5 mm distal right MCA aneurysm.    Assessment / Plan / Recommendation Clinical Impression  Pt completed MOCA assessment with score of 8/30, indicating severe cognitive impairment primarily in domains of memory (short term, working, retrieval and suspect prospective) and awareness (decreased intellectual, emergent, and suspect anticipatory). Left neglect noted during clock drawing task. Cultural differences mildly apparent although pt's husband present and agrees current presentation with memory and awareness impaired from  baseline. Although oriented to year and place, pt provided incorrect responses for month and day. Provided max verbal cues (multiple choice), pt recalled 2/5 words during memory test. She also required several repetitions of instructions and extra processing time during other tasks. Given severity of memory deficits, decreased awareness, and disorientation, recommend pt continue to recieve ST services for coginitve deficits. SLP recommends home health ST services upon d/c.     SLP Assessment  SLP Recommendation/Assessment: Patient needs continued Speech Lanaguage Pathology Services SLP Visit Diagnosis: Cognitive communication deficit (R41.841)    Follow Up Recommendations  Home health SLP    Frequency and Duration min 2x/week  2 weeks      SLP Evaluation Cognition  Overall Cognitive Status: Impaired/Different from baseline Arousal/Alertness: Awake/alert Orientation Level: Oriented to person;Disoriented to time;Oriented to place;Oriented to situation Attention: Focused;Sustained Focused Attention: Appears intact Sustained Attention: Appears intact Memory: Impaired Memory Impairment: Decreased recall of new information;Decreased short term memory;Storage deficit;Retrieval deficit(suspect prospective would also be impaired) Decreased Short Term Memory: Verbal basic Awareness: Impaired Awareness Impairment: Intellectual impairment;Emergent impairment Problem Solving: Impaired Problem Solving Impairment: Verbal basic;Functional basic Executive Function: Sequencing;Self Monitoring;Self Correcting Sequencing: Impaired Sequencing Impairment: Verbal basic Self Monitoring: Impaired Self Monitoring Impairment: Verbal basic Self Correcting: Impaired Self Correcting Impairment: Verbal basic Safety/Judgment: Impaired       Comprehension  Auditory Comprehension Overall Auditory Comprehension: Appears within functional limits for tasks assessed Yes/No Questions: Not tested Commands: Not  tested Conversation: Simple EffectiveTechniques: Repetition;Extra processing time Visual Recognition/Discrimination Discrimination: Not tested Reading Comprehension Reading Status: Not tested    Expression Expression Primary Mode of Expression: Verbal Verbal Expression Overall Verbal Expression: Appears within functional limits for  tasks assessed Initiation: No impairment Level of Generative/Spontaneous Verbalization: Conversation Repetition: Impaired Level of Impairment: Sentence level(impaired only with complex sentences) Naming: No impairment Pragmatics: No impairment Written Expression Dominant Hand: Right Written Expression: Not tested   Oral / Motor  Motor Speech Overall Motor Speech: Appears within functional limits for tasks assessed Respiration: Within functional limits Phonation: Normal Resonance: Within functional limits Articulation: Within functional limitis Motor Planning: Witnin functional limits   Sherry Fitzgerald, Student SLP                     Sherry Fitzgerald 09/16/2017, 1:31 PM

## 2017-09-16 NOTE — Progress Notes (Signed)
ANTICOAGULATION CONSULT NOTE - Initial Consult  Pharmacy Consult for warfarin (currently on heparin) Indication: CVA with carotid thrombus  No Active Allergies  Patient Measurements: Height: 4\' 9"  (144.8 cm) Weight: 123 lb 14.4 oz (56.2 kg) IBW/kg (Calculated) : 38.6  Vital Signs: Temp: 97.8 F (36.6 C) (09/12 0900) Temp Source: Oral (09/12 0900) BP: 152/65 (09/12 0900)  Labs: Recent Labs    09/15/17 1157 09/15/17 1212 09/16/17 0517 09/16/17 1357  HGB 13.0 15.0  --   --   HCT 43.6 44.0  --   --   PLT 263  --   --   --   APTT 26  --   --   --   LABPROT 12.5  --   --   --   INR 0.94  --   --   --   HEPARINUNFRC  --   --  0.33 0.43  CREATININE 0.75 0.70  --   --     Estimated Creatinine Clearance: 47.8 mL/min (by C-G formula based on SCr of 0.7 mg/dL).   Medical History: Past Medical History:  Diagnosis Date  . Hypertension    no medications for years    Medications:  Medications Prior to Admission  Medication Sig Dispense Refill Last Dose  . amLODipine (NORVASC) 5 MG tablet Take 1 tablet (5 mg total) by mouth daily. (Patient not taking: Reported on 08/26/2017) 90 tablet 3 Not Taking at Unknown time  . atorvastatin (LIPITOR) 20 MG tablet Take 1 tablet (20 mg total) by mouth daily. (Patient not taking: Reported on 08/26/2017) 30 tablet 11 Not Taking at Unknown time  . lisinopril-hydrochlorothiazide (ZESTORETIC) 20-12.5 MG per tablet Take 2 tablets by mouth daily. (Patient not taking: Reported on 09/15/2017) 60 tablet 11 Not Taking at Unknown time  . meloxicam (MOBIC) 15 MG tablet Take 1 tablet (15 mg total) by mouth daily. (Patient not taking: Reported on 09/15/2017) 30 tablet 0 Not Taking at Unknown time    Assessment: 72 y.o. female with carotid thrombus. No anticoag PTA. Pharmacy consulted to dose and monitor IV hep for stroke symptoms- low goal, no boluses. Pharmacy now consulted to start warfarin in addition. INR on admit was 0.94, Hgb 15, plt 263. HL was  therapeutic today at 0.43.  Goal of Therapy:  INR 2-3 Heparin level 0.3-0.5 units/ml Monitor platelets by anticoagulation protocol: Yes   Plan:  Continue heparin IV 650 units/hr until INR therapeutic x 48 hours Give warfarin 5 mg po x 1 Monitor daily HL, INR, CBC, clinical course, s/sx of bleed, PO intake, DDI

## 2017-09-16 NOTE — Progress Notes (Signed)
ANTICOAGULATION CONSULT NOTE - Follow Up Consult  Pharmacy Consult for Heparin Indication: CVA with carotid thrombus  No Active Allergies  Patient Measurements: Height: 4\' 9"  (144.8 cm) Weight: 123 lb 14.4 oz (56.2 kg) IBW/kg (Calculated) : 38.6 Heparin Dosing Weight:    Vital Signs: Temp: 97.6 F (36.4 C) (09/12 0420) Temp Source: Oral (09/12 0420) BP: 109/93 (09/12 0500) Pulse Rate: 44 (09/12 0500)  Labs: Recent Labs    09/15/17 1157 09/15/17 1212 09/16/17 0517  HGB 13.0 15.0  --   HCT 43.6 44.0  --   PLT 263  --   --   APTT 26  --   --   LABPROT 12.5  --   --   INR 0.94  --   --   HEPARINUNFRC  --   --  0.33  CREATININE 0.75 0.70  --     Estimated Creatinine Clearance: 47.8 mL/min (by C-G formula based on SCr of 0.7 mg/dL).   Assessment: Anticoag:Carotid thrombus.No anticoag PTA. IV hep for stroke symptoms- low goal, no boluses. HL 0.33. Last Hgb 15.  Goal of Therapy:  Heparin level 0.3-0.7 units/ml Monitor platelets by anticoagulation protocol: Yes   Plan:  Heparin 650 units/hr Daily HL CBC Recheck HL in 6 hrs  Jamita Mckelvin S. Merilynn Finlandobertson, PharmD, BCPS Clinical Staff Pharmacist Pager [phone]  Misty Stanleyobertson, Ronan Dion Stillinger 09/16/2017,8:56 AM

## 2017-09-16 NOTE — Progress Notes (Addendum)
STROKE TEAM PROGRESS NOTE   INTERVAL HISTORY Her husband, son, dtr and grandsons are at the bedside.  Pt lying in the bed. Work up completed. On IV heparin. Stable without neuro worsening. Feels speech and face improved, still confused and unclear on date/time.  Vitals:   09/16/17 0300 09/16/17 0420 09/16/17 0500 09/16/17 0900  BP: (!) 126/100  (!) 109/93 (!) 152/65  Pulse: (!) 44 (!) 49 (!) 44   Resp: 15 14 16    Temp:  97.6 F (36.4 C)  97.8 F (36.6 C)  TempSrc:  Oral  Oral  SpO2: 94%  94%   Weight:      Height:        CBC:  Recent Labs  Lab 09/15/17 1157 09/15/17 1212  WBC 4.9  --   NEUTROABS 2.8  --   HGB 13.0 15.0  HCT 43.6 44.0  MCV 75.7*  --   PLT 263  --     Basic Metabolic Panel:  Recent Labs  Lab 09/15/17 1157 09/15/17 1212  NA 137 138  K 4.2 4.4  CL 102 101  CO2 26  --   GLUCOSE 107* 107*  BUN 7* 9  CREATININE 0.75 0.70  CALCIUM 9.2  --    Lipid Panel:     Component Value Date/Time   CHOL 266 (H) 09/16/2017 0517   TRIG 178 (H) 09/16/2017 0517   HDL 50 09/16/2017 0517   CHOLHDL 5.3 09/16/2017 0517   VLDL 36 09/16/2017 0517   LDLCALC 180 (H) 09/16/2017 0517   HgbA1c:  Lab Results  Component Value Date   HGBA1C 6.1 (H) 09/16/2017   Urine Drug Screen: No results found for: LABOPIA, COCAINSCRNUR, LABBENZ, AMPHETMU, THCU, LABBARB  Alcohol Level No results found for: ETH  IMAGING Ct Angio Head W Or Wo Contrast  Result Date: 09/15/2017 CLINICAL DATA:  Initial evaluation for acute stroke. EXAM: CT ANGIOGRAPHY HEAD AND NECK TECHNIQUE: Multidetector CT imaging of the head and neck was performed using the standard protocol during bolus administration of intravenous contrast. Multiplanar CT image reconstructions and MIPs were obtained to evaluate the vascular anatomy. Carotid stenosis measurements (when applicable) are obtained utilizing NASCET criteria, using the distal internal carotid diameter as the denominator. CONTRAST:  50mL ISOVUE-370 IOPAMIDOL  (ISOVUE-370) INJECTION 76% COMPARISON:  Prior MRI from earlier same day. FINDINGS: CTA NECK FINDINGS Aortic arch: Aortic arch of normal caliber with normal branch pattern. Mild atheromatous plaque about the arch and origin of the great vessels without stenosis. Visualized subclavian arteries widely patent. Right carotid system: Right common carotid artery patent from its origin to the bifurcation without hemodynamically significant stenosis. There is focal eccentric thrombus within the distal right common carotid artery with a thin wisp/linear filling defect extending distally into the carotid lumen (series 7, image 189, 185). Findings suspected to reflect sequelae of a recently ruptured plaque with thrombus formation. This extends into the proximal right ICA. No frank raised dissection flap. No significant atheromatous narrowing about the adjacent right bifurcation. Right ICA widely patent distally without abnormality. Left carotid system: Left common carotid artery patent from its origin to the bifurcation without stenosis. A centric calcified plaque about the left bifurcation without hemodynamically significant stenosis. Left ICA widely patent distally to the skull base without stenosis, dissection or occlusion. Vertebral arteries: Both of the vertebral arteries arise from the subclavian arteries. Left vertebral artery dominant and widely patent within the neck. Right vertebral artery hypoplastic and essentially occludes just distal to its origin. Right vertebral artery  remains occluded within the neck. Skeleton: Unremarkable. Other neck: 1.9 cm right thyroid nodule. Upper chest: Unremarkable. Review of the MIP images confirms the above findings CTA HEAD FINDINGS Anterior circulation: Petrous segments patent bilaterally without stenosis. Scattered multifocal atheromatous plaque within the cavernous/supraclinoid ICAs with moderate multifocal stenoses. ICA termini patent. A1 segments, anterior communicating artery  common anterior cerebral arteries widely patent. Left M1 widely patent. Normal left MCA bifurcation. No proximal left M2 occlusion. Distal left MCA branches well perfused. Right M1 patent to its distal aspect without stenosis. Right MCA bifurcation within normal limits with no aneurysm seen by CTA. Previously noted abnormality on prior MRA most likely reflected venous artifact. No proximal right M2 occlusion. Distal right MCA branches well perfused. Distal small vessel atheromatous irregularity. Posterior circulation: Dominant left vertebral artery patent to the vertebrobasilar junction without stenosis. Distal reconstitution of the hypoplastic right vertebral artery with diffusely irregular flow within the right V4 segment. Left PICA patent. Basilar artery widely patent to its distal aspect without stenosis. Superior cerebellar arteries patent bilaterally. Both of the PCAs supplied via the basilar. Diffuse atheromatous irregularity throughout the PCAs which are patent to their distal aspects. Venous sinuses: Not well assessed due to timing the contrast bolus, but grossly patent. Anatomic variants: None significant. Delayed phase: Evolving right basal ganglia infarcts noted. No abnormal enhancement. Review of the MIP images confirms the above findings IMPRESSION: 1. Negative CTA for large vessel occlusion. 2. Eccentric thrombus within the distal right common carotid artery with thin filling defect protruding into the arterial lumen. Findings suspected to reflect a recently ruptured plaque with thrombus formation. This is acute in appearance, and suspected to reflect source of embolization. 3. No aneurysm identified at the right MCA bifurcation. In retrospect, signal abnormality on previous MRA felt to be secondary to venous artifact/contamination. 4. Diffusely diseased and largely occluded right vertebral artery. Dominant left vertebral artery widely patent. 5. Moderate to advanced carotid siphon atherosclerotic  change. Findings communicated to Dr. Wilford Corner at 7 p.m. on 09/15/2017. Electronically Signed   By: Rise Mu M.D.   On: 09/15/2017 19:03   Ct Head Wo Contrast  Result Date: 09/15/2017 CLINICAL DATA:  Dizziness. Altered mental status and weakness. Memory impairment. EXAM: CT HEAD WITHOUT CONTRAST TECHNIQUE: Contiguous axial images were obtained from the base of the skull through the vertex without intravenous contrast. COMPARISON:  08/25/2017 FINDINGS: Brain: There is acute or subacute infarction in the region of the right frontal white matter/caudate head. No evidence of hemorrhage or mass effect. The remainder the brain appears unchanged with a few old frontal white matter infarctions but no other focal finding. No mass lesion, hydrocephalus or extra-axial collection. Vascular: There is atherosclerotic calcification of the major vessels at the base of the brain. Cannot rule out a 5 mm right MCA aneurysm. Skull: Negative Sinuses/Orbits: Clear/normal Other: None IMPRESSION: Newly seen 1-2 cm region of low-density in the right frontal lobe in the region of the deep white matter and caudate head consistent with acute or subacute infarction. No hemorrhage or mass effect. Few other old frontal white matter infarctions as seen previously. Atherosclerotic change of the major vessels at base of the brain. Question 5 mm right MCA region aneurysm. Electronically Signed   By: Paulina Fusi M.D.   On: 09/15/2017 12:52   Ct Angio Neck W Or Wo Contrast  Result Date: 09/15/2017 CLINICAL DATA:  Initial evaluation for acute stroke. EXAM: CT ANGIOGRAPHY HEAD AND NECK TECHNIQUE: Multidetector CT imaging of the head and neck  was performed using the standard protocol during bolus administration of intravenous contrast. Multiplanar CT image reconstructions and MIPs were obtained to evaluate the vascular anatomy. Carotid stenosis measurements (when applicable) are obtained utilizing NASCET criteria, using the distal  internal carotid diameter as the denominator. CONTRAST:  50mL ISOVUE-370 IOPAMIDOL (ISOVUE-370) INJECTION 76% COMPARISON:  Prior MRI from earlier same day. FINDINGS: CTA NECK FINDINGS Aortic arch: Aortic arch of normal caliber with normal branch pattern. Mild atheromatous plaque about the arch and origin of the great vessels without stenosis. Visualized subclavian arteries widely patent. Right carotid system: Right common carotid artery patent from its origin to the bifurcation without hemodynamically significant stenosis. There is focal eccentric thrombus within the distal right common carotid artery with a thin wisp/linear filling defect extending distally into the carotid lumen (series 7, image 189, 185). Findings suspected to reflect sequelae of a recently ruptured plaque with thrombus formation. This extends into the proximal right ICA. No frank raised dissection flap. No significant atheromatous narrowing about the adjacent right bifurcation. Right ICA widely patent distally without abnormality. Left carotid system: Left common carotid artery patent from its origin to the bifurcation without stenosis. A centric calcified plaque about the left bifurcation without hemodynamically significant stenosis. Left ICA widely patent distally to the skull base without stenosis, dissection or occlusion. Vertebral arteries: Both of the vertebral arteries arise from the subclavian arteries. Left vertebral artery dominant and widely patent within the neck. Right vertebral artery hypoplastic and essentially occludes just distal to its origin. Right vertebral artery remains occluded within the neck. Skeleton: Unremarkable. Other neck: 1.9 cm right thyroid nodule. Upper chest: Unremarkable. Review of the MIP images confirms the above findings CTA HEAD FINDINGS Anterior circulation: Petrous segments patent bilaterally without stenosis. Scattered multifocal atheromatous plaque within the cavernous/supraclinoid ICAs with moderate  multifocal stenoses. ICA termini patent. A1 segments, anterior communicating artery common anterior cerebral arteries widely patent. Left M1 widely patent. Normal left MCA bifurcation. No proximal left M2 occlusion. Distal left MCA branches well perfused. Right M1 patent to its distal aspect without stenosis. Right MCA bifurcation within normal limits with no aneurysm seen by CTA. Previously noted abnormality on prior MRA most likely reflected venous artifact. No proximal right M2 occlusion. Distal right MCA branches well perfused. Distal small vessel atheromatous irregularity. Posterior circulation: Dominant left vertebral artery patent to the vertebrobasilar junction without stenosis. Distal reconstitution of the hypoplastic right vertebral artery with diffusely irregular flow within the right V4 segment. Left PICA patent. Basilar artery widely patent to its distal aspect without stenosis. Superior cerebellar arteries patent bilaterally. Both of the PCAs supplied via the basilar. Diffuse atheromatous irregularity throughout the PCAs which are patent to their distal aspects. Venous sinuses: Not well assessed due to timing the contrast bolus, but grossly patent. Anatomic variants: None significant. Delayed phase: Evolving right basal ganglia infarcts noted. No abnormal enhancement. Review of the MIP images confirms the above findings IMPRESSION: 1. Negative CTA for large vessel occlusion. 2. Eccentric thrombus within the distal right common carotid artery with thin filling defect protruding into the arterial lumen. Findings suspected to reflect a recently ruptured plaque with thrombus formation. This is acute in appearance, and suspected to reflect source of embolization. 3. No aneurysm identified at the right MCA bifurcation. In retrospect, signal abnormality on previous MRA felt to be secondary to venous artifact/contamination. 4. Diffusely diseased and largely occluded right vertebral artery. Dominant left  vertebral artery widely patent. 5. Moderate to advanced carotid siphon atherosclerotic change. Findings communicated to  Dr. Wilford Corner at 7 p.m. on 09/15/2017. Electronically Signed   By: Rise Mu M.D.   On: 09/15/2017 19:03   Mr Maxine Glenn Head Wo Contrast  Result Date: 09/15/2017 CLINICAL DATA:  Stroke. Left facial weakness. Speech change. Abnormal CT. EXAM: MRI HEAD WITHOUT AND WITH CONTRAST MRA HEAD WITHOUT CONTRAST MRA NECK WITHOUT AND WITH CONTRAST TECHNIQUE: Multiplanar, multiecho pulse sequences of the brain and surrounding structures were obtained without and with intravenous contrast. Angiographic images of the Circle of Willis were obtained using MRA technique without intravenous contrast. Angiographic images of the neck were obtained using MRA technique without and with intravenous contrast. Carotid stenosis measurements (when applicable) are obtained utilizing NASCET criteria, using the distal internal carotid diameter as the denominator. CONTRAST:  5 mL Gadovist IV COMPARISON:  CT head 09/15/2017 FINDINGS: MRI HEAD FINDINGS Brain: Acute infarct right frontal white matter. Adjacent scattered small white matter and cortical infarcts also in the right frontal lobe. Small acute infarct in the right caudate. This corresponds to the hypodensity on CT. No other areas of acute infarct elsewhere in the brain. Ventricle size normal. Mild chronic microvascular ischemic change in the white matter. Chronic microhemorrhage in the right lateral thalamus and left putamen. Blood products associated with right MCA aneurysm possibly due to partial thrombosis of aneurysm. No evidence of subarachnoid hemorrhage. Vascular: Normal arterial flow voids. There is a right MCA aneurysm with associated susceptibility suggesting thrombosis or possibly clot around the aneurysm. Skull and upper cervical spine: Negative Sinuses/Orbits: Negative Other: None MRA HEAD FINDINGS Left vertebral artery is widely patent and supplies the  basilar. Minimal if any flow distal right vertebral artery. Left PICA patent. AICA patent bilaterally. Superior cerebellar and posterior cerebral arteries widely patent bilaterally. Mild atherosclerotic irregularity in the cavernous carotid bilaterally. Anterior cerebral arteries widely patent. Left middle cerebral artery widely patent 5 mm aneurysm right MCA bifurcation projecting superiorly. There is susceptibility associated with this aneurysm. High signal on T1 within the aneurysm. The aneurysm contour is irregular raising the possibility of recent thrombus within the aneurysm. Anterior branch of the right middle cerebral artery supplying the right frontal lobe is not seen on the right but is on the left and may be occluded or have slow flow. MRA NECK FINDINGS Antegrade flow in both carotid arteries and left vertebral artery. Minimal flow in the right vertebral artery. Left vertebral artery dominant and widely patent to the basilar. Right vertebral artery is very small and diffusely disease but appears to have minimal contribution to the basilar. Carotid bifurcation patent bilaterally without stenosis. IMPRESSION: 1. Acute infarct in the deep white matter of the right frontal lobe also extending into the head of the caudate on the right. Scattered small peripheral cortical infarcts in the right frontal lobe 2. 5 mm distal right MCA aneurysm. Aneurysm has an unusual configuration with high signal on T1 and susceptibility suggesting partial thrombosis. No evidence of subarachnoid hemorrhage on CT or MRI. Given the proximity of the acute infarct, this raises the possibility of thrombi from the aneurysm. In addition, there appears to be an occluded branch of the right frontal lobe from the right MCA. Recommend CTA head for further evaluation. 3. Both carotid arteries widely patent. Left vertebral artery widely patent. Small diffusely disease right vertebral artery. 4. These results were called by telephone at the  time of interpretation on 09/15/2017 at 4:34 pm to Dr. Jerrell Belfast, who verbally acknowledged these results. Electronically Signed   By: Marlan Palau M.D.   On: 09/15/2017  16:35   Mr Angiogram Neck W Or Wo Contrast  Result Date: 09/15/2017 CLINICAL DATA:  Stroke. Left facial weakness. Speech change. Abnormal CT. EXAM: MRI HEAD WITHOUT AND WITH CONTRAST MRA HEAD WITHOUT CONTRAST MRA NECK WITHOUT AND WITH CONTRAST TECHNIQUE: Multiplanar, multiecho pulse sequences of the brain and surrounding structures were obtained without and with intravenous contrast. Angiographic images of the Circle of Willis were obtained using MRA technique without intravenous contrast. Angiographic images of the neck were obtained using MRA technique without and with intravenous contrast. Carotid stenosis measurements (when applicable) are obtained utilizing NASCET criteria, using the distal internal carotid diameter as the denominator. CONTRAST:  5 mL Gadovist IV COMPARISON:  CT head 09/15/2017 FINDINGS: MRI HEAD FINDINGS Brain: Acute infarct right frontal white matter. Adjacent scattered small white matter and cortical infarcts also in the right frontal lobe. Small acute infarct in the right caudate. This corresponds to the hypodensity on CT. No other areas of acute infarct elsewhere in the brain. Ventricle size normal. Mild chronic microvascular ischemic change in the white matter. Chronic microhemorrhage in the right lateral thalamus and left putamen. Blood products associated with right MCA aneurysm possibly due to partial thrombosis of aneurysm. No evidence of subarachnoid hemorrhage. Vascular: Normal arterial flow voids. There is a right MCA aneurysm with associated susceptibility suggesting thrombosis or possibly clot around the aneurysm. Skull and upper cervical spine: Negative Sinuses/Orbits: Negative Other: None MRA HEAD FINDINGS Left vertebral artery is widely patent and supplies the basilar. Minimal if any flow distal right  vertebral artery. Left PICA patent. AICA patent bilaterally. Superior cerebellar and posterior cerebral arteries widely patent bilaterally. Mild atherosclerotic irregularity in the cavernous carotid bilaterally. Anterior cerebral arteries widely patent. Left middle cerebral artery widely patent 5 mm aneurysm right MCA bifurcation projecting superiorly. There is susceptibility associated with this aneurysm. High signal on T1 within the aneurysm. The aneurysm contour is irregular raising the possibility of recent thrombus within the aneurysm. Anterior branch of the right middle cerebral artery supplying the right frontal lobe is not seen on the right but is on the left and may be occluded or have slow flow. MRA NECK FINDINGS Antegrade flow in both carotid arteries and left vertebral artery. Minimal flow in the right vertebral artery. Left vertebral artery dominant and widely patent to the basilar. Right vertebral artery is very small and diffusely disease but appears to have minimal contribution to the basilar. Carotid bifurcation patent bilaterally without stenosis. IMPRESSION: 1. Acute infarct in the deep white matter of the right frontal lobe also extending into the head of the caudate on the right. Scattered small peripheral cortical infarcts in the right frontal lobe 2. 5 mm distal right MCA aneurysm. Aneurysm has an unusual configuration with high signal on T1 and susceptibility suggesting partial thrombosis. No evidence of subarachnoid hemorrhage on CT or MRI. Given the proximity of the acute infarct, this raises the possibility of thrombi from the aneurysm. In addition, there appears to be an occluded branch of the right frontal lobe from the right MCA. Recommend CTA head for further evaluation. 3. Both carotid arteries widely patent. Left vertebral artery widely patent. Small diffusely disease right vertebral artery. 4. These results were called by telephone at the time of interpretation on 09/15/2017 at 4:34  pm to Dr. Jerrell Belfast, who verbally acknowledged these results. Electronically Signed   By: Marlan Palau M.D.   On: 09/15/2017 16:35   Mr Brain Wo Contrast  Result Date: 09/15/2017 CLINICAL DATA:  Stroke. Left facial weakness.  Speech change. Abnormal CT. EXAM: MRI HEAD WITHOUT AND WITH CONTRAST MRA HEAD WITHOUT CONTRAST MRA NECK WITHOUT AND WITH CONTRAST TECHNIQUE: Multiplanar, multiecho pulse sequences of the brain and surrounding structures were obtained without and with intravenous contrast. Angiographic images of the Circle of Willis were obtained using MRA technique without intravenous contrast. Angiographic images of the neck were obtained using MRA technique without and with intravenous contrast. Carotid stenosis measurements (when applicable) are obtained utilizing NASCET criteria, using the distal internal carotid diameter as the denominator. CONTRAST:  5 mL Gadovist IV COMPARISON:  CT head 09/15/2017 FINDINGS: MRI HEAD FINDINGS Brain: Acute infarct right frontal white matter. Adjacent scattered small white matter and cortical infarcts also in the right frontal lobe. Small acute infarct in the right caudate. This corresponds to the hypodensity on CT. No other areas of acute infarct elsewhere in the brain. Ventricle size normal. Mild chronic microvascular ischemic change in the white matter. Chronic microhemorrhage in the right lateral thalamus and left putamen. Blood products associated with right MCA aneurysm possibly due to partial thrombosis of aneurysm. No evidence of subarachnoid hemorrhage. Vascular: Normal arterial flow voids. There is a right MCA aneurysm with associated susceptibility suggesting thrombosis or possibly clot around the aneurysm. Skull and upper cervical spine: Negative Sinuses/Orbits: Negative Other: None MRA HEAD FINDINGS Left vertebral artery is widely patent and supplies the basilar. Minimal if any flow distal right vertebral artery. Left PICA patent. AICA patent bilaterally.  Superior cerebellar and posterior cerebral arteries widely patent bilaterally. Mild atherosclerotic irregularity in the cavernous carotid bilaterally. Anterior cerebral arteries widely patent. Left middle cerebral artery widely patent 5 mm aneurysm right MCA bifurcation projecting superiorly. There is susceptibility associated with this aneurysm. High signal on T1 within the aneurysm. The aneurysm contour is irregular raising the possibility of recent thrombus within the aneurysm. Anterior branch of the right middle cerebral artery supplying the right frontal lobe is not seen on the right but is on the left and may be occluded or have slow flow. MRA NECK FINDINGS Antegrade flow in both carotid arteries and left vertebral artery. Minimal flow in the right vertebral artery. Left vertebral artery dominant and widely patent to the basilar. Right vertebral artery is very small and diffusely disease but appears to have minimal contribution to the basilar. Carotid bifurcation patent bilaterally without stenosis. IMPRESSION: 1. Acute infarct in the deep white matter of the right frontal lobe also extending into the head of the caudate on the right. Scattered small peripheral cortical infarcts in the right frontal lobe 2. 5 mm distal right MCA aneurysm. Aneurysm has an unusual configuration with high signal on T1 and susceptibility suggesting partial thrombosis. No evidence of subarachnoid hemorrhage on CT or MRI. Given the proximity of the acute infarct, this raises the possibility of thrombi from the aneurysm. In addition, there appears to be an occluded branch of the right frontal lobe from the right MCA. Recommend CTA head for further evaluation. 3. Both carotid arteries widely patent. Left vertebral artery widely patent. Small diffusely disease right vertebral artery. 4. These results were called by telephone at the time of interpretation on 09/15/2017 at 4:34 pm to Dr. Jerrell Belfast, who verbally acknowledged these results.  Electronically Signed   By: Marlan Palau M.D.   On: 09/15/2017 16:35   2D Echocardiogram  - Left ventricle: The cavity size was normal. Wall thickness was normal. Systolic function was normal. The estimated ejection fraction was in the range of 60% to 65%. Wall motion was normal; there  were no regional wall motion abnormalities. Doppler parameters are consistent with abnormal left ventricular relaxation (grade 1 diastolic dysfunction). - Left atrium: The atrium was moderately dilated.   PHYSICAL EXAM HEENT-  Normocephalic, no lesions, without obvious abnormality.   Cardiovascular- S1-S2 audible Lungs-no rhonchi or wheezing noted, no excessive working breathing.  Saturations within normal limits Extremities- Warm, dry and intact Musculoskeletal-no joint tenderness, deformity or swelling Skin-warm and dry  Neurological Examination Mental status: Awake alert oriented to self, place, but not month or year.   Speech is mildly dysarthric, hypophonic, only speaks in response to being spoken to Naming comprehension and repetition intact Poor attention concentration Cranial nerves: Pupils equal round reactive light, extra ocular movements normal, visual fields full, subtle left lower face droop resolved, facial sensation intact, auditory acuity intact, palate elevates midline, shoulder shrug intact, tongue midline. Motor exam: No drift noted in any of the 4 extremities Sensory exam: Intact light touch all over with no extinction Coordination: Intact finger-nose-finger bilaterally Gait testing was deferred at this time.  ASSESSMENT/PLAN Sherry Fitzgerald is a 72 y.o. female with history of HTN, HLD presenting with speech changes, AMS, L face weakness.   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   CT head small R frontal love infarct. Few old R frontal WM infarcts. atherosclerosis based of brain. ? R MCA aneurysm  MRI, MRA brain, MRA neck   R frontal lobe/caudate head  infarct. Scattered small R frontal lobe infarcts. 5mm distral R MCA aneurysm, partial thrombosis. Occluded branch R frontal love. Carotids open  CTA head & neck no LVO. R CCA thrombus, no R MCA aneurysm. Occluded R VA, dominant L VA. Carotid siphon dz.   2D Echo  EF 60-65%. No source of embolus   LDL 180  HgbA1c 6.1  IV heparin for VTE prophylaxis  No antithrombotic prior to admission, now on heparin IV. Recommend add warfarin for po transition. Goal INR 2-3. Ok to bridge with lovenox at time of d/c if INR still not at goal.  Discussed plan with care management who will work on [employer]. Husband Psychologist, prison and probation servicesseal trained medic who can perform injections.  Plan repeat CTA in 2-3 months and decide on continued anticoagulation or switch to antiplatelet.  Therapy recommendations:  pending   Disposition:  Pending  R CCA clot  Etiology not sure  Suspect occult afib could be a cause  Recommend 30 day cardiac event monitoring as outpt to rule out afib  At the time in 2-3 months, if decided to discontinue anticoagulation, may consider TEE and loop recorder if 30 day cardiac event monitoring negative.   Hypertension  Home meds: none  Variable . BP goal < 180 . Long-term BP goal normotensive  Hyperlipidemia  Home meds:  No statin (pt not taking any of her med)  Now on lipitor 80  LDL 180, goal < 70  Continue statin at discharge  Hyperglycemia  HgbA1c 6.1  Other Stroke Risk Factors  Advanced age  Family hx stroke (mother, father)  Hospital day # 1  Sherry MainSharon Biby, MSN, APRN, ANVP-BC, AGPCNP-BC Advanced Practice Stroke Nurse [facility] See Amion for Schedule & Pager information 09/16/2017 9:52 AM   ATTENDING NOTE: I reviewed above note and agree with the assessment and plan. I have made revision directly on the above note. Pt was seen and examined.   72 year old female with history of hypertension, hyperlipidemia admitted for confusion, speech difficulty and  left facial  droop.  CT head showed right BG/caudate head infarct.  MRI confirmed right carotid and BG infarct but also right MCA/ACA watershed infarcts.  CT head and neck reviewed right CTA thrombosis, which likely the cause of patient's embolic stroke.  CTA head and neck also showed right VA occlusion and bilateral siphon atherosclerosis.  EF 60 to 65%.  LDL 180 and A1c 6.1.  Overnight, patient started on heparin IV for right CCA thrombus.  Agree with anticoagulation, and recommend add Coumadin for switching to p.o. anticoagulation.  INR goal 2-3.  Once INR at goal, heparin IV can be discontinued.  If INR not at goal at discharge, Lovenox can be used as bridge on discharge.  Recommend continue anti-correlation for 3 months and then repeat CTA neck to decide further treatment with anticoagulation or antiplatelet.  The cause of right CCA thrombus is not clear at this time.  Concerning for subclinical A. fib.  Recommend 30-day cardio event monitoring as outpatient to rule out A. fib.  At the time in 2-3 months after CTA repeat, if decided to discontinue anticoagulation, may consider TEE and loop recorder if 30 day cardiac event monitoring negative.   Continue high-dose Lipitor for hyperlipidemia and stroke prevention.  Stroke risk factor modification.  PT/OT recommend home PT/OT.  Neurology will sign off. Please call with questions. Pt will follow up with Dr. Pearlean Brownie at [facility] in about 6 weeks. Thanks for the consult.   Marvel Plan, MD PhD Stroke Neurology 09/16/2017 5:06 PM  I spent  35 minutes in total face-to-face time with the patient, more than 50% of which was spent in counseling and coordination of care, reviewing test results, images and medication, and discussing the diagnosis of stroke, carotid thrombosis, HLD, treatment plan and potential prognosis. This patient's care requiresreview of multiple databases, neurological assessment, discussion with family, other specialists and medical decision  making of high complexity.        To contact Stroke Continuity provider, please refer to [email or web address]. After hours, contact General Neurology

## 2017-09-16 NOTE — Progress Notes (Signed)
Nutrition Brief Note  RD consulted for assessment of nutritional status/needs due to diagnosis of CVA.   Wt Readings from Last 15 Encounters:  09/15/17 56.2 kg  08/25/17 55.3 kg  07/30/14 55.3 kg  05/16/13 54 kg  11/28/12 57.2 kg  10/17/12 58.1 kg  09/07/12 57.6 kg  07/27/12 54.9 kg  06/08/12 55.8 kg  07/28/11 61.5 kg  05/06/11 59 kg  04/10/11 57.2 kg  03/18/11 56.7 kg  02/24/11 57.6 kg  02/09/11 57.7 kg   [facility] CraneKon Elita Fitzgerald is a 72 y.o. female with medical history significant of HTN presenting with AMS.   Pt admitted with CVA.   Spoke with pt, daughter, and husband at bedside. All confirm that pt has a great appetite. She reports that she tries to follow a very healthful lifestyle, which includes a diet full of leans meats and vegetables and walking at least 4 miles per day. Pt consumes 2-3 meals per day, which consist of seafood and asian greens such as pumpkin leaf. She denies any difficulty chewing or swallowing food.   Pt denies any weight loss. Sh reports UBW of around 125#; per daughter pt was trying to lose weight through lifestyle modifications.  Nutrition-Focused physical exam completed. Findings are no fat depletion, no muscle depletion, and no edema.   Body mass index is 26.81 kg/m. Patient meets criteria for overweight based on current BMI.   Current diet order is Heart Healthy, patient is consuming approximately 100% of meals at this time. Labs and medications reviewed.   No nutrition interventions warranted at this time. If nutrition issues arise, please consult RD.   Sherry Fitzgerald, RD, LDN, CDE Pager: [phone] After hours Pager: [phone]

## 2017-09-17 LAB — BASIC METABOLIC PANEL
ANION GAP: 8 (ref 5–15)
BUN: 10 mg/dL (ref 8–23)
CALCIUM: 9 mg/dL (ref 8.9–10.3)
CO2: 26 mmol/L (ref 22–32)
Chloride: 106 mmol/L (ref 98–111)
Creatinine, Ser: 0.75 mg/dL (ref 0.44–1.00)
GFR calc Af Amer: >60 mL/min (ref >60)
GFR calc non Af Amer: >60 mL/min (ref >60)
Glucose, Bld: 100 mg/dL — ABNORMAL HIGH (ref 70–99)
Potassium: 4.2 mmol/L (ref 3.5–5.1)
Sodium: 140 mmol/L (ref 135–145)

## 2017-09-17 LAB — PROTIME-INR
INR: 1.01
PROTHROMBIN TIME: 13.2 s (ref 11.4–15.2)

## 2017-09-17 LAB — CBC
HCT: 43.7 % (ref 36.0–46.0)
HEMOGLOBIN: 13 g/dL (ref 12.0–15.0)
MCH: 22.5 pg — AB (ref 26.0–34.0)
MCHC: 29.7 g/dL — ABNORMAL LOW (ref 30.0–36.0)
MCV: 75.7 fL — ABNORMAL LOW (ref 78.0–100.0)
Platelets: 225 10*3/uL (ref 150–400)
RBC: 5.77 MIL/uL — ABNORMAL HIGH (ref 3.87–5.11)
RDW: 13.6 % (ref 11.5–15.5)
WBC: 6.2 10*3/uL (ref 4.0–10.5)

## 2017-09-17 LAB — HIV ANTIBODY (ROUTINE TESTING W REFLEX): HIV SCREEN 4TH GENERATION: NONREACTIVE

## 2017-09-17 LAB — HEPARIN LEVEL (UNFRACTIONATED): Heparin Unfractionated: 0.6 IU/mL (ref 0.30–0.70)

## 2017-09-17 MED ORDER — COUMADIN BOOK
Freq: Once | Status: AC
  Administered 2017-09-17: 09:00:00
  Filled 2017-09-17: qty 1

## 2017-09-17 MED ORDER — ENOXAPARIN (LOVENOX) PATIENT EDUCATION KIT
PACK | Freq: Once | Status: AC
  Administered 2017-09-17: 15:00:00
  Filled 2017-09-17: qty 1

## 2017-09-17 MED ORDER — WARFARIN SODIUM 5 MG PO TABS
5.0000 mg | ORAL_TABLET | Freq: Once | ORAL | Status: AC
  Administered 2017-09-17: 5 mg via ORAL
  Filled 2017-09-17: qty 1

## 2017-09-17 MED ORDER — ENOXAPARIN SODIUM 60 MG/0.6ML ~~LOC~~ SOLN
60.0000 mg | Freq: Two times a day (BID) | SUBCUTANEOUS | Status: DC
  Administered 2017-09-17 – 2017-09-18 (×3): 60 mg via SUBCUTANEOUS
  Filled 2017-09-17 (×3): qty 0.6

## 2017-09-17 NOTE — Care Management (Signed)
09-17-17  BENEFITS CHECK :  # 1. PATIENT HAS MEDICAID [place]         EFF- DATE : 08-05-2017        CO-PAY- $ 3.80 FOR EACH RX  LOVENOX 56 MG SQ BID FOR 10 DAYS

## 2017-09-17 NOTE — Progress Notes (Signed)
ANTICOAGULATION CONSULT NOTE - Follow Up Consult  Pharmacy Consult for Heparin/Coumadin>>Lovenox/Coumadin Indication: CVA with carotid thrombus  No Active Allergies  Patient Measurements: Height: 4\' 9"  (144.8 cm) Weight: 123 lb 14.4 oz (56.2 kg) IBW/kg (Calculated) : 38.6 Heparin Dosing Weight:  50.6 kg  Vital Signs: Temp: 98.6 F (37 C) (09/13 0837) Temp Source: Oral (09/13 0837) BP: 132/62 (09/13 0837) Pulse Rate: 53 (09/13 0837)  Labs: Recent Labs    09/15/17 1157 09/15/17 1212 09/16/17 0517 09/16/17 1357 09/17/17 0430  HGB 13.0 15.0  --   --  13.0  HCT 43.6 44.0  --   --  43.7  PLT 263  --   --   --  225  APTT 26  --   --   --   --   LABPROT 12.5  --   --   --  13.2  INR 0.94  --   --   --  1.01  HEPARINUNFRC  --   --  0.33 0.43 0.60  CREATININE 0.75 0.70  --   --  0.75    Estimated Creatinine Clearance: 47.8 mL/min (by C-G formula based on SCr of 0.75 mg/dL).  Assessment: Anticoag:Carotid thrombus.No anticoag PTA. IV hep for stroke symptoms- low goal, no boluses. HL 0.6 slightly > goal for CVA. Hgb 13. Plts ok. INR 1.01. Coumadin dose NOT charted last PM. - Consulted to change IV heparin>LMWH for discharge.  Goal of Therapy:  Heparin level 0.3-0.5 Monitor platelets by anticoagulation protocol: Yes   Plan:  D/c IV heparin Start Lovenox 60mg  SQ BID this AM Coumadin 5mg  po x 1 Coumadin book. Education today   [employer] CorporationCrystal S. Merilynn Finlandobertson, PharmD, BCPS Clinical Staff Pharmacist [phone]  Misty Stanleyobertson, Clarkson Rosselli Stillinger 09/17/2017,10:37 AM

## 2017-09-17 NOTE — Care Management Important Message (Signed)
Important Message  Patient Details  Name: Sherry MainlandK [name] Sa Kevan NyKon Nash MRN: [id number] Date of Birth: Nov 23, 1945   Medicare Important Message Given:  Yes    Dorena BodoIris Jaye Saal 09/17/2017, 3:30 PM

## 2017-09-17 NOTE — Plan of Care (Signed)

## 2017-09-17 NOTE — Care Management Important Message (Signed)
Important Message  Patient Details  Name: Sherry Fitzgerald MRN: [id number] Date of Birth: 07/02/1945   Medicare Important Message Given:  Yes    Jenelle Drennon 09/17/2017, 3:30 PM 

## 2017-09-17 NOTE — Progress Notes (Deleted)
Pt refusing to take medications. Attending MD notified.  

## 2017-09-17 NOTE — Progress Notes (Signed)
[facility]                                                                              Patient Demographics  Sherry Fitzgerald Sherry Fitzgerald, is a 72 y.o. female, DOB - [birth year], ZOX:[id number]  Admit date - 09/15/2017   Admitting Physician Jonah Blue, MD  Outpatient Primary MD for the patient is Sherry Buddy, MD  Outpatient specialists:   LOS - 2  days   Medical records reviewed and are as summarized below:    Chief Complaint  Patient presents with  . Altered Mental Status       Brief summary   Patient is a 72 year old female with hypertension presented to ED with altered mental status and facial droop.  At baseline, patient is very functional, however, in the last week patient has been having increasing confusion intermittently, worse in the last 2 days preceding presentation.  Her husband noticed facial drooping 2 days ago and patient was not speaking clearly.  CT head in ED showed subacute infarct in the right basal ganglia/caudate.  Patient was admitted for further work-up of stroke.   Assessment & Plan    Principal Problem: Acute CVA (cerebral vascular accident) (HCC) -Presented with intermittent confusion, facial drooping, dysarthria -CT head showed 1 to 2 cm region of low density in the right frontal lobe in the region of deep white matter and caudate head consistent with acute or subacute infarction, few other old frontal matter infarctions as seen previously -Brain showed acute infarct in the deep white matter of right frontal lobe extending into the head of caudate on the right, scattered small peripheral cortical infarct in the right frontal lobe.  5 mm distal right MCA aneurysm.  Appears to be an occluded branch of the right frontal lobe from the right MCA -Patient underwent CTA head and neck which was negative for large vessel occlusion.  Eccentric thrombus within the distal right common carotid artery with thin filling defect protruding into the  arterial lumen, suspected to reflect source of embolization. No aneurysm identified at the right MCA bifurcation. -Neurology was consulted and recommended heparin drip and  Neuro discussed with Dr. Corliss Skains for possible cerebral angiogram -Follow 2D echo -LDL 180, placed on statin hemoglobin A1c 6.1 -Currently on IV heparin drip, will follow stroke service recommendations  10/04/2017: Patient is going to start Coumadin today.  Discussed with pharmacy team, will change heparin drip to subcutaneous Lovenox as patient will be discharged back home on Lovenox.  Likely discharge home in the morning.   Active Problems:   Hyperlipidemia -LDL 180, placed on high-dose statin Lipitor 80 mg daily    HYPERTENSION, BENIGN SYSTEMIC -Currently on lisinopril, hydralazine as needed with parameters -BP currently stable   Code Status: Full CODE STATUS DVT Prophylaxis: Heparin drip Family Communication: Husband, in the presence of patient's and nurses.   Disposition Plan: Home on Coumadin and subcu to Lovenox, with INR goal of 2-3  Time Spent in minutes 25 minutes  Procedures:  CT angiogram head and neck, MRI brain  Consultants:   Neurology  Antimicrobials:      Medications  Scheduled Meds: .  atorvastatin  80 mg Oral Daily  . enoxaparin (LOVENOX) injection  60 mg Subcutaneous Q12H  . enoxaparin   Does not apply Once  . lisinopril  40 mg Oral Daily  . patient's guide to using coumadin book   Does not apply Once  . polyethylene glycol  17 g Oral Once  . warfarin  5 mg Oral ONCE-1800  . warfarin  5 mg Oral ONCE-1800  . Warfarin - Pharmacist Dosing Inpatient   Does not apply q1800   Continuous Infusions: . sodium chloride 50 mL/hr at 09/16/17 0600   PRN Meds:.acetaminophen **OR** acetaminophen (TYLENOL) oral liquid 160 mg/5 mL **OR** acetaminophen, hydrALAZINE, senna-docusate   Antibiotics   Anti-infectives (From admission, onward)   None        Subjective:   Patient seen  alongside patient's husband and 2 nurses.  Patient remains stable.  No fever or chills, no chest pain.  No further confusion.  Obvious motor weakness.  Dentist to start Coumadin this evening.  Already on heparin, but will change to Lovenox (discussed with pharmacy team)  Objective:   Vitals:   09/16/17 1947 09/16/17 2352 09/17/17 0406 09/17/17 0837  BP: (!) 135/46 128/87 100/88 132/62  Pulse: (!) 50 (!) 50 (!) 48 (!) 53  Resp: 18 18 18 16   Temp: 97.7 F (36.5 C) 97.7 F (36.5 C) 97.9 F (36.6 C) 98.6 F (37 C)  TempSrc: Oral Oral Oral Oral  SpO2: 97% 99% 98% 99%  Weight:      Height:        Intake/Output Summary (Last 24 hours) at 09/17/2017 1051 Last data filed at 09/17/2017 0400 Gross per 24 hour  Intake 1243 ml  Output -  Net 1243 ml     Wt Readings from Last 3 Encounters:  09/15/17 56.2 kg  08/25/17 55.3 kg  07/30/14 55.3 kg     Exam  General: Alert and oriented x 3, NAD, dysarthria improving  Eyes: PERRLA, EOMI, Anicteric Sclera,  HEENT:  Atraumatic, normocephalic, normal oropharynx  Cardiovascular: S1 S2 auscultated, no rubs, murmurs or gallops. Regular rate and rhythm.  Respiratory: Clear to auscultation bilaterally, no wheezing, rales or rhonchi  Gastrointestinal: Soft, nontender, nondistended, + bowel sounds  Ext: no pedal edema bilaterally  Neuro: AAOx3, Cr N's II- XII. Strength 5/5 upper and lower extremities bilaterally  Musculoskeletal: No digital cyanosis, clubbing   Data Reviewed:  I have personally reviewed following labs and imaging studies  Micro Results No results found for this or any previous visit (from the past 240 hour(s)).  Radiology Reports Ct Angio Head W Or Wo Contrast  Result Date: 09/15/2017 CLINICAL DATA:  Initial evaluation for acute stroke. EXAM: CT ANGIOGRAPHY HEAD AND NECK TECHNIQUE: Multidetector CT imaging of the head and neck was performed using the standard protocol during bolus administration of intravenous  contrast. Multiplanar CT image reconstructions and MIPs were obtained to evaluate the vascular anatomy. Carotid stenosis measurements (when applicable) are obtained utilizing NASCET criteria, using the distal internal carotid diameter as the denominator. CONTRAST:  50mL ISOVUE-370 IOPAMIDOL (ISOVUE-370) INJECTION 76% COMPARISON:  Prior MRI from earlier same day. FINDINGS: CTA NECK FINDINGS Aortic arch: Aortic arch of normal caliber with normal branch pattern. Mild atheromatous plaque about the arch and origin of the great vessels without stenosis. Visualized subclavian arteries widely patent. Right carotid system: Right common carotid artery patent from its origin to the bifurcation without hemodynamically significant stenosis. There is focal eccentric thrombus within the distal right common carotid artery with a  thin wisp/linear filling defect extending distally into the carotid lumen (series 7, image 189, 185). Findings suspected to reflect sequelae of a recently ruptured plaque with thrombus formation. This extends into the proximal right ICA. No frank raised dissection flap. No significant atheromatous narrowing about the adjacent right bifurcation. Right ICA widely patent distally without abnormality. Left carotid system: Left common carotid artery patent from its origin to the bifurcation without stenosis. A centric calcified plaque about the left bifurcation without hemodynamically significant stenosis. Left ICA widely patent distally to the skull base without stenosis, dissection or occlusion. Vertebral arteries: Both of the vertebral arteries arise from the subclavian arteries. Left vertebral artery dominant and widely patent within the neck. Right vertebral artery hypoplastic and essentially occludes just distal to its origin. Right vertebral artery remains occluded within the neck. Skeleton: Unremarkable. Other neck: 1.9 cm right thyroid nodule. Upper chest: Unremarkable. Review of the MIP images confirms  the above findings CTA HEAD FINDINGS Anterior circulation: Petrous segments patent bilaterally without stenosis. Scattered multifocal atheromatous plaque within the cavernous/supraclinoid ICAs with moderate multifocal stenoses. ICA termini patent. A1 segments, anterior communicating artery common anterior cerebral arteries widely patent. Left M1 widely patent. Normal left MCA bifurcation. No proximal left M2 occlusion. Distal left MCA branches well perfused. Right M1 patent to its distal aspect without stenosis. Right MCA bifurcation within normal limits with no aneurysm seen by CTA. Previously noted abnormality on prior MRA most likely reflected venous artifact. No proximal right M2 occlusion. Distal right MCA branches well perfused. Distal small vessel atheromatous irregularity. Posterior circulation: Dominant left vertebral artery patent to the vertebrobasilar junction without stenosis. Distal reconstitution of the hypoplastic right vertebral artery with diffusely irregular flow within the right V4 segment. Left PICA patent. Basilar artery widely patent to its distal aspect without stenosis. Superior cerebellar arteries patent bilaterally. Both of the PCAs supplied via the basilar. Diffuse atheromatous irregularity throughout the PCAs which are patent to their distal aspects. Venous sinuses: Not well assessed due to timing the contrast bolus, but grossly patent. Anatomic variants: None significant. Delayed phase: Evolving right basal ganglia infarcts noted. No abnormal enhancement. Review of the MIP images confirms the above findings IMPRESSION: 1. Negative CTA for large vessel occlusion. 2. Eccentric thrombus within the distal right common carotid artery with thin filling defect protruding into the arterial lumen. Findings suspected to reflect a recently ruptured plaque with thrombus formation. This is acute in appearance, and suspected to reflect source of embolization. 3. No aneurysm identified at the right  MCA bifurcation. In retrospect, signal abnormality on previous MRA felt to be secondary to venous artifact/contamination. 4. Diffusely diseased and largely occluded right vertebral artery. Dominant left vertebral artery widely patent. 5. Moderate to advanced carotid siphon atherosclerotic change. Findings communicated to Dr. Wilford Corner at 7 p.m. on 09/15/2017. Electronically Signed   By: Rise Mu M.D.   On: 09/15/2017 19:03   Ct Head Wo Contrast  Result Date: 09/15/2017 CLINICAL DATA:  Dizziness. Altered mental status and weakness. Memory impairment. EXAM: CT HEAD WITHOUT CONTRAST TECHNIQUE: Contiguous axial images were obtained from the base of the skull through the vertex without intravenous contrast. COMPARISON:  08/25/2017 FINDINGS: Brain: There is acute or subacute infarction in the region of the right frontal white matter/caudate head. No evidence of hemorrhage or mass effect. The remainder the brain appears unchanged with a few old frontal white matter infarctions but no other focal finding. No mass lesion, hydrocephalus or extra-axial collection. Vascular: There is atherosclerotic calcification of the major  vessels at the base of the brain. Cannot rule out a 5 mm right MCA aneurysm. Skull: Negative Sinuses/Orbits: Clear/normal Other: None IMPRESSION: Newly seen 1-2 cm region of low-density in the right frontal lobe in the region of the deep white matter and caudate head consistent with acute or subacute infarction. No hemorrhage or mass effect. Few other old frontal white matter infarctions as seen previously. Atherosclerotic change of the major vessels at base of the brain. Question 5 mm right MCA region aneurysm. Electronically Signed   By: Paulina Fusi M.D.   On: 09/15/2017 12:52   Ct Head Wo Contrast  Result Date: 08/25/2017 CLINICAL DATA:  Dizziness with vomiting EXAM: CT HEAD WITHOUT CONTRAST TECHNIQUE: Contiguous axial images were obtained from the base of the skull through the vertex  without intravenous contrast. COMPARISON:  January 13, 2010 FINDINGS: Brain: The ventricles are normal in size and configuration. There is no intracranial mass, hemorrhage, extra-axial fluid collection, or midline shift. There is mild patchy small vessel disease in the centra semiovale bilaterally. Elsewhere gray-white compartments appear normal. No acute infarct is evident. Vascular: No hyperdense vessel. There is calcification in each carotid siphon and distal vertebral artery. Skull: The bony calvarium appears intact. Sinuses/Orbits: There is mucosal thickening in multiple ethmoid air cells. Other visualized paranasal sinuses are clear. Orbits appear symmetric bilaterally. Other: Mastoid air cells are clear. IMPRESSION: Slight periventricular small vessel disease. No acute infarct. No mass or hemorrhage. There are foci of arterial vascular calcification. There is mucosal thickening in multiple ethmoid air cells. Electronically Signed   By: Bretta Bang III M.D.   On: 08/25/2017 19:10   Ct Angio Neck W Or Wo Contrast  Result Date: 09/15/2017 CLINICAL DATA:  Initial evaluation for acute stroke. EXAM: CT ANGIOGRAPHY HEAD AND NECK TECHNIQUE: Multidetector CT imaging of the head and neck was performed using the standard protocol during bolus administration of intravenous contrast. Multiplanar CT image reconstructions and MIPs were obtained to evaluate the vascular anatomy. Carotid stenosis measurements (when applicable) are obtained utilizing NASCET criteria, using the distal internal carotid diameter as the denominator. CONTRAST:  50mL ISOVUE-370 IOPAMIDOL (ISOVUE-370) INJECTION 76% COMPARISON:  Prior MRI from earlier same day. FINDINGS: CTA NECK FINDINGS Aortic arch: Aortic arch of normal caliber with normal branch pattern. Mild atheromatous plaque about the arch and origin of the great vessels without stenosis. Visualized subclavian arteries widely patent. Right carotid system: Right common carotid artery  patent from its origin to the bifurcation without hemodynamically significant stenosis. There is focal eccentric thrombus within the distal right common carotid artery with a thin wisp/linear filling defect extending distally into the carotid lumen (series 7, image 189, 185). Findings suspected to reflect sequelae of a recently ruptured plaque with thrombus formation. This extends into the proximal right ICA. No frank raised dissection flap. No significant atheromatous narrowing about the adjacent right bifurcation. Right ICA widely patent distally without abnormality. Left carotid system: Left common carotid artery patent from its origin to the bifurcation without stenosis. A centric calcified plaque about the left bifurcation without hemodynamically significant stenosis. Left ICA widely patent distally to the skull base without stenosis, dissection or occlusion. Vertebral arteries: Both of the vertebral arteries arise from the subclavian arteries. Left vertebral artery dominant and widely patent within the neck. Right vertebral artery hypoplastic and essentially occludes just distal to its origin. Right vertebral artery remains occluded within the neck. Skeleton: Unremarkable. Other neck: 1.9 cm right thyroid nodule. Upper chest: Unremarkable. Review of the MIP images confirms  the above findings CTA HEAD FINDINGS Anterior circulation: Petrous segments patent bilaterally without stenosis. Scattered multifocal atheromatous plaque within the cavernous/supraclinoid ICAs with moderate multifocal stenoses. ICA termini patent. A1 segments, anterior communicating artery common anterior cerebral arteries widely patent. Left M1 widely patent. Normal left MCA bifurcation. No proximal left M2 occlusion. Distal left MCA branches well perfused. Right M1 patent to its distal aspect without stenosis. Right MCA bifurcation within normal limits with no aneurysm seen by CTA. Previously noted abnormality on prior MRA most likely  reflected venous artifact. No proximal right M2 occlusion. Distal right MCA branches well perfused. Distal small vessel atheromatous irregularity. Posterior circulation: Dominant left vertebral artery patent to the vertebrobasilar junction without stenosis. Distal reconstitution of the hypoplastic right vertebral artery with diffusely irregular flow within the right V4 segment. Left PICA patent. Basilar artery widely patent to its distal aspect without stenosis. Superior cerebellar arteries patent bilaterally. Both of the PCAs supplied via the basilar. Diffuse atheromatous irregularity throughout the PCAs which are patent to their distal aspects. Venous sinuses: Not well assessed due to timing the contrast bolus, but grossly patent. Anatomic variants: None significant. Delayed phase: Evolving right basal ganglia infarcts noted. No abnormal enhancement. Review of the MIP images confirms the above findings IMPRESSION: 1. Negative CTA for large vessel occlusion. 2. Eccentric thrombus within the distal right common carotid artery with thin filling defect protruding into the arterial lumen. Findings suspected to reflect a recently ruptured plaque with thrombus formation. This is acute in appearance, and suspected to reflect source of embolization. 3. No aneurysm identified at the right MCA bifurcation. In retrospect, signal abnormality on previous MRA felt to be secondary to venous artifact/contamination. 4. Diffusely diseased and largely occluded right vertebral artery. Dominant left vertebral artery widely patent. 5. Moderate to advanced carotid siphon atherosclerotic change. Findings communicated to Dr. Wilford Corner at 7 p.m. on 09/15/2017. Electronically Signed   By: Rise Mu M.D.   On: 09/15/2017 19:03   Mr Maxine Glenn Head Wo Contrast  Result Date: 09/15/2017 CLINICAL DATA:  Stroke. Left facial weakness. Speech change. Abnormal CT. EXAM: MRI HEAD WITHOUT AND WITH CONTRAST MRA HEAD WITHOUT CONTRAST MRA NECK WITHOUT  AND WITH CONTRAST TECHNIQUE: Multiplanar, multiecho pulse sequences of the brain and surrounding structures were obtained without and with intravenous contrast. Angiographic images of the Circle of Willis were obtained using MRA technique without intravenous contrast. Angiographic images of the neck were obtained using MRA technique without and with intravenous contrast. Carotid stenosis measurements (when applicable) are obtained utilizing NASCET criteria, using the distal internal carotid diameter as the denominator. CONTRAST:  5 mL Gadovist IV COMPARISON:  CT head 09/15/2017 FINDINGS: MRI HEAD FINDINGS Brain: Acute infarct right frontal white matter. Adjacent scattered small white matter and cortical infarcts also in the right frontal lobe. Small acute infarct in the right caudate. This corresponds to the hypodensity on CT. No other areas of acute infarct elsewhere in the brain. Ventricle size normal. Mild chronic microvascular ischemic change in the white matter. Chronic microhemorrhage in the right lateral thalamus and left putamen. Blood products associated with right MCA aneurysm possibly due to partial thrombosis of aneurysm. No evidence of subarachnoid hemorrhage. Vascular: Normal arterial flow voids. There is a right MCA aneurysm with associated susceptibility suggesting thrombosis or possibly clot around the aneurysm. Skull and upper cervical spine: Negative Sinuses/Orbits: Negative Other: None MRA HEAD FINDINGS Left vertebral artery is widely patent and supplies the basilar. Minimal if any flow distal right vertebral artery. Left PICA patent. AICA  patent bilaterally. Superior cerebellar and posterior cerebral arteries widely patent bilaterally. Mild atherosclerotic irregularity in the cavernous carotid bilaterally. Anterior cerebral arteries widely patent. Left middle cerebral artery widely patent 5 mm aneurysm right MCA bifurcation projecting superiorly. There is susceptibility associated with this  aneurysm. High signal on T1 within the aneurysm. The aneurysm contour is irregular raising the possibility of recent thrombus within the aneurysm. Anterior branch of the right middle cerebral artery supplying the right frontal lobe is not seen on the right but is on the left and may be occluded or have slow flow. MRA NECK FINDINGS Antegrade flow in both carotid arteries and left vertebral artery. Minimal flow in the right vertebral artery. Left vertebral artery dominant and widely patent to the basilar. Right vertebral artery is very small and diffusely disease but appears to have minimal contribution to the basilar. Carotid bifurcation patent bilaterally without stenosis. IMPRESSION: 1. Acute infarct in the deep white matter of the right frontal lobe also extending into the head of the caudate on the right. Scattered small peripheral cortical infarcts in the right frontal lobe 2. 5 mm distal right MCA aneurysm. Aneurysm has an unusual configuration with high signal on T1 and susceptibility suggesting partial thrombosis. No evidence of subarachnoid hemorrhage on CT or MRI. Given the proximity of the acute infarct, this raises the possibility of thrombi from the aneurysm. In addition, there appears to be an occluded branch of the right frontal lobe from the right MCA. Recommend CTA head for further evaluation. 3. Both carotid arteries widely patent. Left vertebral artery widely patent. Small diffusely disease right vertebral artery. 4. These results were called by telephone at the time of interpretation on 09/15/2017 at 4:34 pm to Dr. Jerrell BelfastAurora, who verbally acknowledged these results. Electronically Signed   By: Marlan Palauharles  Clark M.D.   On: 09/15/2017 16:35   Mr Angiogram Neck W Or Wo Contrast  Result Date: 09/15/2017 CLINICAL DATA:  Stroke. Left facial weakness. Speech change. Abnormal CT. EXAM: MRI HEAD WITHOUT AND WITH CONTRAST MRA HEAD WITHOUT CONTRAST MRA NECK WITHOUT AND WITH CONTRAST TECHNIQUE: Multiplanar,  multiecho pulse sequences of the brain and surrounding structures were obtained without and with intravenous contrast. Angiographic images of the Circle of Willis were obtained using MRA technique without intravenous contrast. Angiographic images of the neck were obtained using MRA technique without and with intravenous contrast. Carotid stenosis measurements (when applicable) are obtained utilizing NASCET criteria, using the distal internal carotid diameter as the denominator. CONTRAST:  5 mL Gadovist IV COMPARISON:  CT head 09/15/2017 FINDINGS: MRI HEAD FINDINGS Brain: Acute infarct right frontal white matter. Adjacent scattered small white matter and cortical infarcts also in the right frontal lobe. Small acute infarct in the right caudate. This corresponds to the hypodensity on CT. No other areas of acute infarct elsewhere in the brain. Ventricle size normal. Mild chronic microvascular ischemic change in the white matter. Chronic microhemorrhage in the right lateral thalamus and left putamen. Blood products associated with right MCA aneurysm possibly due to partial thrombosis of aneurysm. No evidence of subarachnoid hemorrhage. Vascular: Normal arterial flow voids. There is a right MCA aneurysm with associated susceptibility suggesting thrombosis or possibly clot around the aneurysm. Skull and upper cervical spine: Negative Sinuses/Orbits: Negative Other: None MRA HEAD FINDINGS Left vertebral artery is widely patent and supplies the basilar. Minimal if any flow distal right vertebral artery. Left PICA patent. AICA patent bilaterally. Superior cerebellar and posterior cerebral arteries widely patent bilaterally. Mild atherosclerotic irregularity in the cavernous carotid  bilaterally. Anterior cerebral arteries widely patent. Left middle cerebral artery widely patent 5 mm aneurysm right MCA bifurcation projecting superiorly. There is susceptibility associated with this aneurysm. High signal on T1 within the  aneurysm. The aneurysm contour is irregular raising the possibility of recent thrombus within the aneurysm. Anterior branch of the right middle cerebral artery supplying the right frontal lobe is not seen on the right but is on the left and may be occluded or have slow flow. MRA NECK FINDINGS Antegrade flow in both carotid arteries and left vertebral artery. Minimal flow in the right vertebral artery. Left vertebral artery dominant and widely patent to the basilar. Right vertebral artery is very small and diffusely disease but appears to have minimal contribution to the basilar. Carotid bifurcation patent bilaterally without stenosis. IMPRESSION: 1. Acute infarct in the deep white matter of the right frontal lobe also extending into the head of the caudate on the right. Scattered small peripheral cortical infarcts in the right frontal lobe 2. 5 mm distal right MCA aneurysm. Aneurysm has an unusual configuration with high signal on T1 and susceptibility suggesting partial thrombosis. No evidence of subarachnoid hemorrhage on CT or MRI. Given the proximity of the acute infarct, this raises the possibility of thrombi from the aneurysm. In addition, there appears to be an occluded branch of the right frontal lobe from the right MCA. Recommend CTA head for further evaluation. 3. Both carotid arteries widely patent. Left vertebral artery widely patent. Small diffusely disease right vertebral artery. 4. These results were called by telephone at the time of interpretation on 09/15/2017 at 4:34 pm to Dr. Jerrell Belfast, who verbally acknowledged these results. Electronically Signed   By: Marlan Palau M.D.   On: 09/15/2017 16:35   Mr Brain Wo Contrast  Result Date: 09/15/2017 CLINICAL DATA:  Stroke. Left facial weakness. Speech change. Abnormal CT. EXAM: MRI HEAD WITHOUT AND WITH CONTRAST MRA HEAD WITHOUT CONTRAST MRA NECK WITHOUT AND WITH CONTRAST TECHNIQUE: Multiplanar, multiecho pulse sequences of the brain and surrounding  structures were obtained without and with intravenous contrast. Angiographic images of the Circle of Willis were obtained using MRA technique without intravenous contrast. Angiographic images of the neck were obtained using MRA technique without and with intravenous contrast. Carotid stenosis measurements (when applicable) are obtained utilizing NASCET criteria, using the distal internal carotid diameter as the denominator. CONTRAST:  5 mL Gadovist IV COMPARISON:  CT head 09/15/2017 FINDINGS: MRI HEAD FINDINGS Brain: Acute infarct right frontal white matter. Adjacent scattered small white matter and cortical infarcts also in the right frontal lobe. Small acute infarct in the right caudate. This corresponds to the hypodensity on CT. No other areas of acute infarct elsewhere in the brain. Ventricle size normal. Mild chronic microvascular ischemic change in the white matter. Chronic microhemorrhage in the right lateral thalamus and left putamen. Blood products associated with right MCA aneurysm possibly due to partial thrombosis of aneurysm. No evidence of subarachnoid hemorrhage. Vascular: Normal arterial flow voids. There is a right MCA aneurysm with associated susceptibility suggesting thrombosis or possibly clot around the aneurysm. Skull and upper cervical spine: Negative Sinuses/Orbits: Negative Other: None MRA HEAD FINDINGS Left vertebral artery is widely patent and supplies the basilar. Minimal if any flow distal right vertebral artery. Left PICA patent. AICA patent bilaterally. Superior cerebellar and posterior cerebral arteries widely patent bilaterally. Mild atherosclerotic irregularity in the cavernous carotid bilaterally. Anterior cerebral arteries widely patent. Left middle cerebral artery widely patent 5 mm aneurysm right MCA bifurcation projecting superiorly. There  is susceptibility associated with this aneurysm. High signal on T1 within the aneurysm. The aneurysm contour is irregular raising the  possibility of recent thrombus within the aneurysm. Anterior branch of the right middle cerebral artery supplying the right frontal lobe is not seen on the right but is on the left and may be occluded or have slow flow. MRA NECK FINDINGS Antegrade flow in both carotid arteries and left vertebral artery. Minimal flow in the right vertebral artery. Left vertebral artery dominant and widely patent to the basilar. Right vertebral artery is very small and diffusely disease but appears to have minimal contribution to the basilar. Carotid bifurcation patent bilaterally without stenosis. IMPRESSION: 1. Acute infarct in the deep white matter of the right frontal lobe also extending into the head of the caudate on the right. Scattered small peripheral cortical infarcts in the right frontal lobe 2. 5 mm distal right MCA aneurysm. Aneurysm has an unusual configuration with high signal on T1 and susceptibility suggesting partial thrombosis. No evidence of subarachnoid hemorrhage on CT or MRI. Given the proximity of the acute infarct, this raises the possibility of thrombi from the aneurysm. In addition, there appears to be an occluded branch of the right frontal lobe from the right MCA. Recommend CTA head for further evaluation. 3. Both carotid arteries widely patent. Left vertebral artery widely patent. Small diffusely disease right vertebral artery. 4. These results were called by telephone at the time of interpretation on 09/15/2017 at 4:34 pm to Dr. Jerrell Belfast, who verbally acknowledged these results. Electronically Signed   By: Marlan Palau M.D.   On: 09/15/2017 16:35    Lab Data:  CBC: Recent Labs  Lab 09/15/17 1157 09/15/17 1212 09/17/17 0430  WBC 4.9  --  6.2  NEUTROABS 2.8  --   --   HGB 13.0 15.0 13.0  HCT 43.6 44.0 43.7  MCV 75.7*  --  75.7*  PLT 263  --  225   Basic Metabolic Panel: Recent Labs  Lab 09/15/17 1157 09/15/17 1212 09/17/17 0430  NA 137 138 140  K 4.2 4.4 4.2  CL 102 101 106  CO2  26  --  26  GLUCOSE 107* 107* 100*  BUN 7* 9 10  CREATININE 0.75 0.70 0.75  CALCIUM 9.2  --  9.0   GFR: Estimated Creatinine Clearance: 47.8 mL/min (by C-G formula based on SCr of 0.75 mg/dL). Liver Function Tests: Recent Labs  Lab 09/15/17 1157  AST 25  ALT 19  ALKPHOS 67  BILITOT 0.6  PROT 7.4  ALBUMIN 3.7   No results for input(s): LIPASE, AMYLASE in the last 168 hours. No results for input(s): AMMONIA in the last 168 hours. Coagulation Profile: Recent Labs  Lab 09/15/17 1157 09/17/17 0430  INR 0.94 1.01   Cardiac Enzymes: No results for input(s): CKTOTAL, CKMB, CKMBINDEX, TROPONINI in the last 168 hours. BNP (last 3 results) No results for input(s): PROBNP in the last 8760 hours. HbA1C: Recent Labs    09/16/17 0517  HGBA1C 6.1*   CBG: No results for input(s): GLUCAP in the last 168 hours. Lipid Profile: Recent Labs    09/16/17 0517  CHOL 266*  HDL 50  LDLCALC 180*  TRIG 178*  CHOLHDL 5.3   Thyroid Function Tests: Recent Labs    09/15/17 1933  TSH 2.357   Anemia Panel: No results for input(s): VITAMINB12, FOLATE, FERRITIN, TIBC, IRON, RETICCTPCT in the last 72 hours. Urine analysis:    Component Value Date/Time   COLORURINE STRAW (A)  08/25/2017 2213   APPEARANCEUR CLEAR 08/25/2017 2213   LABSPEC 1.004 (L) 08/25/2017 2213   PHURINE 5.0 08/25/2017 2213   GLUCOSEU NEGATIVE 08/25/2017 2213   HGBUR NEGATIVE 08/25/2017 2213   BILIRUBINUR NEGATIVE 08/25/2017 2213   KETONESUR 5 (A) 08/25/2017 2213   PROTEINUR NEGATIVE 08/25/2017 2213   NITRITE NEGATIVE 08/25/2017 2213   LEUKOCYTESUR NEGATIVE 08/25/2017 2213     Barnetta Chapel M.D. [facility] 09/17/2017, 10:51 AM  Between 7am to 7pm - call Pager - [phone].  After 7pm go to [email or web address] - password [email or web address]  Call night coverage person covering after 7pm

## 2017-09-17 NOTE — Evaluation (Addendum)
Physical Therapy Evaluation Patient Details Name: Sherry Fitzgerald Fitzgerald MRN: [id number] DOB: 1945/09/15 Today's Date: 09/17/2017   History of Present Illness  [name] Fitzgerald 72 y/o female admitted 09/15/2017 with AMS, facial droop and dysarthria.  Imaging positive for R frontal lobe infarct, few old frontal WM infarcts, R MCA aneurysm, partial thrombosis, occluded branch R frontal lobe.  PMH includes HTN.  Clinical Impression  Patient presents with mild cognitive deficits and mild balance deficits s/p above. Pt independent, driving and active PTA walking 4-5 miles/day. Tolerated gait training and balance activities today with only mild deviations in gait. Able to step over objects, change directions and perform head turns without overt LOB. Able to find way back to room. Pt will have 24/7 supervision from spouse. Seems to have some minor cognitive deficits- impaired problem solving, following 3 or more step commands consistently and possibly questionable awareness of deficits? Hard to ascertain due to language/culture barrier. Recommend HHPT for safety evaluation. Does not require acute therapy services at this time as pt seems to be improving each day, has 24/7 and is close to functional baseline. Encourage ambulation with RN while in the hospital. Discharge from therapy.    Follow Up Recommendations Home health PT;Supervision - Intermittent    Equipment Recommendations  None recommended by PT    Recommendations for Other Services       Precautions / Restrictions Precautions Precautions: None Restrictions Weight Bearing Restrictions: No      Mobility  Bed Mobility                  Transfers                    Ambulation/Gait Ambulation/Gait assistance: Supervision Gait Distance (Feet): 350 Feet Assistive device: None Gait Pattern/deviations: Step-through pattern;Decreased stride length   Gait velocity interpretation: >2.62 ft/sec, indicative of community  ambulatory General Gait Details: Steady gait. Tolerated higher level balance challenges, no overt LOB. See balance section for details.  Stairs            Wheelchair Mobility    Modified Rankin (Stroke Patients Only) Modified Rankin (Stroke Patients Only) Pre-Morbid Rankin Score: Slight disability Modified Rankin: Slight disability     Balance Overall balance assessment: Needs assistance Sitting-balance support: Feet supported;No upper extremity supported Sitting balance-Leahy Scale: Good     Standing balance support: During functional activity Standing balance-Leahy Scale: Good Standing balance comment: Able to stand at sink and perform ADls tasks reaching outside BoS without difficulty.              High level balance activites: Direction changes;Turns;Sudden stops;Head turns High Level Balance Comments: Tolerated above with only minor deviations in gait path- difficulty following instructions for DGI due to culture per spouse. Able to step over object with demonstration.             Pertinent Vitals/Pain      Home Living                        Prior Function                 Hand Dominance        Extremity/Trunk Assessment                Communication      Cognition  General Comments General comments (skin integrity, edema, etc.): Spouse present during session.     Exercises     Assessment/Plan    PT Assessment Patent does not need any further PT services  PT Problem List         PT Treatment Interventions      PT Goals (Current goals can be found in the Care Plan section)  Acute Rehab PT Goals Patient Stated Goal: to get home  PT Goal Formulation: All assessment and education complete, DC therapy    Frequency     Barriers to discharge        Co-evaluation               AM-PAC PT "6 Clicks" Daily Activity  Outcome Measure Difficulty  turning over in bed (including adjusting bedclothes, sheets and blankets)?: None Difficulty moving from lying on back to sitting on the side of the bed? : None Difficulty sitting down on and standing up from Sherry Fitzgerald chair with arms (e.g., wheelchair, bedside commode, etc,.)?: None Help needed moving to and from Sherry Fitzgerald bed to chair (including Sherry Fitzgerald wheelchair)?: None Help needed walking in hospital room?: Sherry Fitzgerald Fitzgerald Help needed climbing 3-5 steps with Sherry Fitzgerald railing? : Sherry Fitzgerald Fitzgerald 6 Click Score: 22    End of Session Equipment Utilized During Treatment: Gait belt Activity Tolerance: Patient tolerated treatment well Patient left: in bed;with call bell/phone within reach;with family/visitor present Nurse Communication: Mobility status PT Visit Diagnosis: Unsteadiness on feet (R26.81)    Time: 9604-5409 PT Time Calculation (min) (ACUTE ONLY): 26 min   Charges:   PT Evaluation $PT Eval Moderate Complexity: 1 Mod PT Treatments $Gait Training: 8-22 mins        Sherry Fitzgerald Fitzgerald, PT, DPT Acute Rehabilitation Services Pager [phone] Office [phone]      Sherry Fitzgerald Fitzgerald Sherry Fitzgerald Fitzgerald 09/17/2017, 1:02 PM

## 2017-09-17 NOTE — Progress Notes (Signed)
ANTICOAGULATION CONSULT NOTE - Follow Up Consult  Pharmacy Consult for Heparin/Coumadin Indication: CVA with carotid thrombus  No Active Allergies  Patient Measurements: Height: 4\' 9"  (144.8 cm) Weight: 123 lb 14.4 oz (56.2 kg) IBW/kg (Calculated) : 38.6 Heparin Dosing Weight:  50.6 kg  Vital Signs: Temp: 97.9 F (36.6 C) (09/13 0406) Temp Source: Oral (09/13 0406) BP: 100/88 (09/13 0406) Pulse Rate: 48 (09/13 0406)  Labs: Recent Labs    09/15/17 1157 09/15/17 1212 09/16/17 0517 09/16/17 1357 09/17/17 0430  HGB 13.0 15.0  --   --  13.0  HCT 43.6 44.0  --   --  43.7  PLT 263  --   --   --  225  APTT 26  --   --   --   --   LABPROT 12.5  --   --   --  13.2  INR 0.94  --   --   --  1.01  HEPARINUNFRC  --   --  0.33 0.43 0.60  CREATININE 0.75 0.70  --   --  0.75    Estimated Creatinine Clearance: 47.8 mL/min (by C-G formula based on SCr of 0.75 mg/dL).  Assessment: Anticoag:Carotid thrombus.No anticoag PTA. IV hep for stroke symptoms- low goal, no boluses. HL 0.6 slightly > goal for CVA. Hgb 13. Plts ok. INR 1.01. Coumadin dose NOT charted last PM.  Goal of Therapy:  Heparin level 0.3-0.5 Monitor platelets by anticoagulation protocol: Yes   Plan:  Decrease IV Heparin 600 units/hr Daily HL CBC, Daily INR Coumadin 5mg  po x 1 Coumadin book. Education today   UAL CorporationCrystal S. Merilynn Finlandobertson, PharmD, BCPS Clinical Staff Pharmacist [phone]  Misty Stanleyobertson, Kamarri Lovvorn Stillinger 09/17/2017,8:32 AM

## 2017-09-17 NOTE — Progress Notes (Signed)
Occupational Therapy Treatment Patient Details Name: Sherry Fitzgerald MRN: [id number] DOB: [birth year] Today's Date: 09/17/2017    History of present illness Pt is a 72 y/o female admitted 09/15/2017 with AMS, facial droop and dysarthria.  Imaging positive for R frontal lobe infarct, few old frontal WM infarcts, R MCA aneurysm, partial thrombosis, occluded branch R frontal lobe.  PMH includes HTN.   OT comments  Patient progressing well with mobility, limited by cognition.  Short blessed test completed with significant impairment, still disoriented to time. Patient doing well with simple ADL functional tasks, but limited with IADL tasks.  Unable to recall items during search in room requiring maximal cueing. Spouse agreeable to assist due to cognition.  Will continue to follow and recommend HHOT in order optimize return to IADL function.    Follow Up Recommendations  Home health OT;Supervision/Assistance - 24 hour    Equipment Recommendations  None recommended by OT    Recommendations for Other Services PT consult    Precautions / Restrictions Precautions Precautions: None Restrictions Weight Bearing Restrictions: No       Mobility Bed Mobility               General bed mobility comments: seated EOB upon entry  Transfers Overall transfer level: Needs assistance Equipment used: None Transfers: Sit to/from Stand Sit to Stand: Supervision         General transfer comment: supervision for safety    Balance Overall balance assessment: Needs assistance Sitting-balance support: Feet supported;No upper extremity supported Sitting balance-Leahy Scale: Good     Standing balance support: During functional activity Standing balance-Leahy Scale: Good Standing balance comment: Able to stand at sink and perform ADls tasks reaching outside BoS without difficulty.              High level balance activites: Direction changes;Turns;Sudden stops;Head turns High Level  Balance Comments: Tolerated above with only minor deviations in gait path- difficulty following instructions for DGI due to culture per spouse. Able to step over object with demonstration.           ADL either performed or assessed with clinical judgement   ADL Overall ADL's : Needs assistance/impaired     Grooming: Supervision/safety;Standing;Brushing hair;Wash/dry hands               Lower Body Dressing: Supervision/safety;Cueing for safety;Sit to/from stand Lower Body Dressing Details (indicate cue type and reason): supervision, cueign for safety to complete seated Toilet Transfer: Supervision/safety;Ambulation;Regular Toilet;Grab bars   Toileting- Clothing Manipulation and Hygiene: Supervision/safety;Sit to/from stand         General ADL Comments: supervision for safety during mobility, engaged in 4 item memory and visual location task with maximal cueing to recall each item      Vision       Perception     Praxis      Cognition Arousal/Alertness: Awake/alert Behavior During Therapy: WFL for tasks assessed/performed Overall Cognitive Status: Impaired/Different from baseline Area of Impairment: Orientation                 Orientation Level: Disoriented to;Time   Memory: Decreased short-term memory Following Commands: Follows multi-step commands inconsistently;Follows multi-step commands with increased time Safety/Judgement: Decreased awareness of safety;Decreased awareness of deficits Awareness: Emergent Problem Solving: Slow processing;Requires verbal cues General Comments: spouse reports cognition and memory improving but not back to baseline; Short blessed completed scoring 23/28 revealing significant impairments in memory and sequencing          Exercises  Shoulder Instructions       General Comments spouse present and supportive    Pertinent Vitals/ Pain       Pain Assessment: No/denies pain  Home Living                                           Prior Functioning/Environment              Frequency  Min 2X/week        Progress Toward Goals  OT Goals(current goals can now be found in the care plan section)  Progress towards OT goals: Progressing toward goals  Acute Rehab OT Goals Patient Stated Goal: to get home  OT Goal Formulation: With patient Time For Goal Achievement: 09/30/17 Potential to Achieve Goals: Good  Plan Discharge plan remains appropriate;Frequency remains appropriate    Co-evaluation                 AM-PAC PT "6 Clicks" Daily Activity     Outcome Measure   Help from another person eating meals?: None Help from another person taking care of personal grooming?: None Help from another person toileting, which includes using toliet, bedpan, or urinal?: None Help from another person bathing (including washing, rinsing, drying)?: A Little Help from another person to put on and taking off regular upper body clothing?: None Help from another person to put on and taking off regular lower body clothing?: A Little 6 Click Score: 22    End of Session    OT Visit Diagnosis: Unsteadiness on feet (R26.81);Other symptoms and signs involving cognitive function   Activity Tolerance Patient tolerated treatment well   Patient Left with call bell/phone within reach;with family/visitor present(seated EOB)   Nurse Communication Mobility status        Time: 1610-9604 OT Time Calculation (min): 23 min  Charges: OT General Charges $OT Visit: 1 Visit OT Treatments $Self Care/Home Management : 23-37 mins  Chancy Milroy, OT Acute Rehabilitation Services Pager [phone] Office [phone]    Chancy Milroy 09/17/2017, 1:07 PM

## 2017-09-17 NOTE — Care Management Note (Signed)
Case Management Note  Patient Details  Name: Sherry Fitzgerald MRN: [id number] Date of Birth: [birth year]  Subjective/Objective:     Pt admitted with a stroke. She is from home with her spouse. Pt was IADL.               [facility]/Plan: [facility] them [facility] and the spouse selected [facility]. Pt will need F2F and HH orders placed. CM following.  Expected Discharge Date:                  Expected Discharge Plan:  Home w Home Health Services  In-House Referral:     Discharge planning Services  CM Consult  Post Acute Care Choice:  Home Health Choice offered to:  Patient, Spouse  DME Arranged:    DME Agency:     HH Arranged:    [facility]  Status of Service:  In process, will continue to follow  If discussed at Long Length of Stay Meetings, dates discussed:    Additional Comments:  Kermit BaloKelli F Keondre Markson, RN 09/17/2017, 3:59 PM

## 2017-09-18 LAB — CBC
HCT: 45.5 % (ref 36.0–46.0)
Hemoglobin: 13.4 g/dL (ref 12.0–15.0)
MCH: 22.6 pg — ABNORMAL LOW (ref 26.0–34.0)
MCHC: 29.5 g/dL — ABNORMAL LOW (ref 30.0–36.0)
MCV: 76.6 fL — ABNORMAL LOW (ref 78.0–100.0)
Platelets: 219 10*3/uL (ref 150–400)
RBC: 5.94 MIL/uL — ABNORMAL HIGH (ref 3.87–5.11)
RDW: 13.6 % (ref 11.5–15.5)
WBC: 5.5 10*3/uL (ref 4.0–10.5)

## 2017-09-18 LAB — PROTIME-INR
INR: 1.03
PROTHROMBIN TIME: 13.4 s (ref 11.4–15.2)

## 2017-09-18 MED ORDER — ATORVASTATIN CALCIUM 80 MG PO TABS: 80.0000 mg | ORAL_TABLET | Freq: Every day | ORAL | 0 refills | Status: DC

## 2017-09-18 MED ORDER — WARFARIN SODIUM 5 MG PO TABS: 5.0000 mg | ORAL_TABLET | Freq: Once | ORAL | Status: DC

## 2017-09-18 MED ORDER — WARFARIN SODIUM 5 MG PO TABS: 5.0000 mg | ORAL_TABLET | Freq: Once | ORAL | 0 refills | Status: DC

## 2017-09-18 MED ORDER — ENOXAPARIN SODIUM 60 MG/0.6ML ~~LOC~~ SOLN: 60.0000 mg | Freq: Two times a day (BID) | SUBCUTANEOUS | 0 refills | Status: DC

## 2017-09-18 MED ORDER — LISINOPRIL 40 MG PO TABS: 40.0000 mg | ORAL_TABLET | Freq: Every day | ORAL | 0 refills | Status: DC

## 2017-09-18 NOTE — Progress Notes (Signed)
ANTICOAGULATION CONSULT NOTE - Follow Up Consult  Pharmacy Consult for Heparin/Coumadin>>Lovenox/Coumadin Indication: CVA with carotid thrombus  No Active Allergies  Patient Measurements: Height: 4\' 9"  (144.8 cm) Weight: 123 lb 14.4 oz (56.2 kg) IBW/kg (Calculated) : 38.6 Heparin Dosing Weight:  50.6 kg  Vital Signs: Temp: 98.3 F (36.8 C) (09/14 0325) Temp Source: Oral (09/14 0325) BP: 113/65 (09/14 0325) Pulse Rate: 49 (09/14 0325)  Labs: Recent Labs    09/15/17 1157 09/15/17 1212 09/16/17 0517 09/16/17 1357 09/17/17 0430 09/18/17 0620 09/18/17 0746  HGB 13.0 15.0  --   --  13.0  --  13.4  HCT 43.6 44.0  --   --  43.7  --  45.5  PLT 263  --   --   --  225  --  219  APTT 26  --   --   --   --   --   --   LABPROT 12.5  --   --   --  13.2 13.4  --   INR 0.94  --   --   --  1.01 1.03  --   HEPARINUNFRC  --   --  0.33 0.43 0.60  --   --   CREATININE 0.75 0.70  --   --  0.75  --   --     Estimated Creatinine Clearance: 47.8 mL/min (by C-G formula based on SCr of 0.75 mg/dL).  Assessment: Anticoag:Carotid thrombus.No anticoag PTA. S/p IV hep for stroke symptoms. H&H stable, Plts 225. No signs/symptoms of bleeding reported.  INR subtherapeutic at 1.03.   Goal of Therapy:  INR 2-3 Lovenox 60mg  SQ BID until INR at goal x2  Monitor platelets by anticoagulation protocol: Yes   Plan:  Continue Lovenox 60mg  SQ BID  Coumadin 5mg  po x 1 Monitor daily INR, CBC, s/s of bleeding.   Expected discharge today, continue Lovenox bridge of 60mg  SQ BID and Coumadin 5mg  daily. Follow up INR out patient.   Foye DeerAnna Love, Pharm D PGY1 Pharmacy Resident  Phone [phone] Please use AMION for clinical pharmacists numbers  09/18/2017      8:28 AM

## 2017-09-18 NOTE — Care Management (Addendum)
Lovenox is $3.50.  Pt has Medicaid.  Pt will need to check with pharmacy of choice to ensure availability.  Kandee Keenory [facility] given referral for home health PT/OT/RN.  [facility] Monday.

## 2017-09-18 NOTE — Progress Notes (Signed)
NURSING PROGRESS NOTE  Sherry Fitzgerald [id number] Discharge Data: 09/18/2017 2:14 PM Attending Provider: Barnetta Chapelgbata, Sylvester I, MD IEP:[id number]:Fletcher, Gerilyn PilgrimJacob, MD     Sherry Fitzgerald to be D/C'd Home per MD order.  Discussed with the patient the After Visit Summary and all questions fully answered. All IV's discontinued with no bleeding noted. All belongings returned to patient for patient to take home.   Last Vital Signs:  Blood pressure 122/62, pulse (!) 53, temperature 97.8 F (36.6 C), temperature source Oral, resp. rate 18, height 4\' 9"  (1.448 m), weight 56.2 kg, SpO2 100 %.  Discharge Medication List Allergies as of 09/18/2017   No Active Allergies     Medication List    STOP taking these medications   amLODipine 5 MG tablet Commonly known as:  NORVASC   lisinopril-hydrochlorothiazide 20-12.5 MG tablet Commonly known as:  PRINZIDE,ZESTORETIC   meloxicam 15 MG tablet Commonly known as:  MOBIC     TAKE these medications   atorvastatin 80 MG tablet Commonly known as:  LIPITOR Take 1 tablet (80 mg total) by mouth daily. Start taking on:  09/19/2017 What changed:    medication strength  how much to take   enoxaparin 60 MG/0.6ML injection Commonly known as:  LOVENOX Inject 0.6 mLs (60 mg total) into the skin every 12 (twelve) hours for 7 days. Please discontinue when INR gets to 2.0   lisinopril 40 MG tablet Commonly known as:  PRINIVIL,ZESTRIL Take 1 tablet (40 mg total) by mouth daily. Start taking on:  09/19/2017   warfarin 5 MG tablet Commonly known as:  COUMADIN Take 1 tablet (5 mg total) by mouth one time only at 6 PM.

## 2017-09-18 NOTE — Discharge Summary (Signed)
Physician Discharge Summary  Patient ID: Sherry Fitzgerald MRN: [id number] DOB/AGE: 10-Mar-1945 72 y.o.  Admit date: 09/15/2017 Discharge date: 09/18/2017  Admission Diagnoses:  Discharge Diagnoses:  Principal Problem:   CVA (cerebral vascular accident) [facility](HCC) Active Problems:   Hyperlipidemia   HYPERTENSION, BENIGN SYSTEMIC   Hyperglycemia   Discharged Condition: stable  Hospital Course: Patient is a 72 year old female with hypertension presented to ED with altered mental status and facial droop.  At baseline, patient is very functional, however, in the week preceding admission, patient had been having increasing confusion intermittently, worse in the 2 days preceding presentation.  Her husband noticed facial drooping 2 days prior to presentation and speech problems (the patient was not speaking clearly).  CT head in ED showed subacute infarct in the right basal ganglia/caudate.  Patient was admitted for further work-up of stroke.  Acute CVA (cerebral vascular accident) (HCC) -Presented with intermittent confusion, facial drooping, dysarthria -CT head showed 1 to 2 cm region of low density in the right frontal lobe in the region of deep white matter and caudate head consistent with acute or subacute infarction, few other old frontal matter infarctions as seen previously -Brain showed acute infarct in the deep white matter of right frontal lobe extending into the head of caudate on the right, scattered small peripheral cortical infarct in the right frontal lobe.  5 mm distal right MCA aneurysm.  Appears to be an occluded branch of the right frontal lobe from the right MCA -Patient underwent CTA head and neck which was negative for large vessel occlusion.  Eccentric thrombus within the distal right common carotid artery with thin filling defect protruding into the arterial lumen, suspected to reflect source of embolization. No aneurysm identified at the right MCA bifurcation. -Neurology was  consulted and recommended heparin drip and  Neuro discussed with Dr. Corliss Skainseveshwar for possible cerebral angiogram -Follow 2D echo -LDL 180, placed on statin hemoglobin A1c 6.1 -Patient was managed with subcutaneous Lovenox and Coumadin during the hospital stay.  Will be discharged home on both medications, and Lovenox will be discontinued when INR gets to 2.0.  Hyperlipidemia: -LDL 180, placed on high-dose statin Lipitor 80 mg daily  Hypertension: Stable.   Consults: neurology  Discharge Exam: Blood pressure 122/62, pulse (!) 53, temperature 97.8 F (36.6 C), temperature source Oral, resp. rate 18, height 4\' 9"  (1.448 m), weight 56.2 kg, SpO2 100 %.   Disposition: Discharge disposition: 06-Home-Health Care Svc   Discharge Instructions    Ambulatory referral to Neurology   Complete by:  As directed    [facility] in 4-6 weeks. Too complicated for RN to follow. Thanks.   Diet - low sodium heart healthy   Complete by:  As directed    Discharge instructions   Complete by:  As directed    Please discontinue subcutaneous Lovenox when INR gets to 2.0.   Increase activity slowly   Complete by:  As directed      Allergies as of 09/18/2017   No Active Allergies     Medication List    STOP taking these medications   amLODipine 5 MG tablet Commonly known as:  NORVASC   lisinopril-hydrochlorothiazide 20-12.5 MG tablet Commonly known as:  PRINZIDE,ZESTORETIC   meloxicam 15 MG tablet Commonly known as:  MOBIC     TAKE these medications   atorvastatin 80 MG tablet Commonly known as:  LIPITOR Take 1 tablet (80 mg total) by mouth daily. Start taking on:  09/19/2017 What changed:    medication strength  how much to take   enoxaparin 60 MG/0.6ML injection Commonly known as:  LOVENOX Inject 0.6 mLs (60 mg total) into the skin every 12 (twelve) hours for 7 days. Please discontinue when INR gets to 2.0   lisinopril 40 MG tablet Commonly known as:   PRINIVIL,ZESTRIL Take 1 tablet (40 mg total) by mouth daily. Start taking on:  09/19/2017   warfarin 5 MG tablet Commonly known as:  COUMADIN Take 1 tablet (5 mg total) by mouth one time only at 6 PM.      Follow-up Information    Micki Riley, MD. Schedule an appointment as soon as possible for a visit in 6 week(s).   Specialties:  Neurology, Radiology Contact information: [address] [place] [address] [phone]          Time spent: 31 minutes.  SignedBarnetta Chapel 09/18/2017, 1:20 PM

## 2017-09-18 NOTE — Discharge Instructions (Signed)

## 2017-09-20 DIAGNOSIS — I69392 Facial weakness following cerebral infarction: Secondary | ICD-10-CM | POA: Diagnosis not present

## 2017-09-20 DIAGNOSIS — I69322 Dysarthria following cerebral infarction: Secondary | ICD-10-CM | POA: Diagnosis not present

## 2017-09-21 ENCOUNTER — Telehealth: Payer: Self-pay

## 2017-09-21 ENCOUNTER — Telehealth: Payer: Self-pay | Admitting: *Deleted

## 2017-09-21 DIAGNOSIS — I69392 Facial weakness following cerebral infarction: Secondary | ICD-10-CM | POA: Diagnosis not present

## 2017-09-21 DIAGNOSIS — I69322 Dysarthria following cerebral infarction: Secondary | ICD-10-CM | POA: Diagnosis not present

## 2017-09-21 NOTE — Telephone Encounter (Signed)
Pt husband called and wants to know if bruising is normal after Lovenox injections.   Advised that bruising at the injection site is a normal side effect.  Advised she could use ice pack on stomach for pain but per husband she is not having pain.    Will forward to MD for additional info if needed. Sherry Fitzgerald, Sherry RochesterJessica Fitzgerald, CMA

## 2017-09-21 NOTE — Telephone Encounter (Signed)
Kathie RhodesBetty, RN, called nurse line stating she needs HH orders for INRs for patient. Kathie RhodesBetty will also be faxing over skilled nursing order for MD to sign.   You can call Kathie RhodesBetty for INR verbal orders [phone]

## 2017-09-22 ENCOUNTER — Other Ambulatory Visit: Payer: Self-pay | Admitting: *Deleted

## 2017-09-22 ENCOUNTER — Encounter: Payer: Self-pay | Admitting: *Deleted

## 2017-09-22 NOTE — Patient Outreach (Signed)
[facility]) Care Management  09/22/2017  Suzan NailerK [name] Sa Kevan NyKon Nash [birth year] [id number]   EMMI-stroke   RED ON EMMI ALERT Day # 1 Date: 09/21/17 1007 Red Alert Reason: scheduled a follow up appointment? no Insurance medicare, medicaid of [place]   Not on APL, admitted 09/15/17, [facility] on lovenox injections and [facility] for PT/OT/RN   Outreach attempt # 1 successful at the listed mobile number  Patient is able to verify HIPAA [facility] RN reviewed and addressed red alert with patient Mr and Mrs Elita Booneash confirm a f/u with Dr Primitivo GauzeFletcher but not with Neurologist [facility] the [facility] reports he will make a neurology appointment for the patient today    Social: Lives with her spouse, married for 3 years. Retired Art gallery managerleutenant combat RN/medic who is now having memory concerns after CVA and needing assist with ADLs and iADLs from her husband, Brett CanalesSteve. Mr Elita Booneash provides transportation to appointments and is the primary caregiver. She speaks English but during the call gave permission for her husband to speak with CM   Conditions: CVA, HTN, Hyperlipidemia, plantar fasciitis, right cervical radiculopathy at c5, bilateral knee pain, right leg weakness, chest pain, hyperglycemia ( A1c 6.1 09/16/17)  Medications: denies concerns with taking medications as prescribed, affording medications, side effects of medications and questions about medications  Appointments: 10/26/17 1355 Dr Primitivo GauzeFletcher f/u stroke appointment, Dr Pearlean BrownieSethi Neurology Noted prior to completing note an appointment was made for 11/10/17 0830  Advance Directives: Denies need for assist with or assist with changes for advance directives   Consent: [facility] RN CM reviewed [facility] with patient. Patient gave verbal consent for services. He denies need of services from [facility]/Telephonic RN CM, pharmacy, health coach, NP or SW at this time  Advised patient that there will be  further automated EMMI- post discharge calls to assess how the patient is doing following the recent hospitalization Advised the patient that another call may be received from a nurse if any of their responses were abnormal. Patient voiced understanding and was appreciative of f/u call.   Plan: [facility] CM will follow up with Mrs Fredricka BonineSa Kon Nash within 10-14 business days for second EMMI stroke contact (flu, appts [facility] RN CM sent a successful outreach letter as discussed with [facility] broch[facility] enclosed for review [facility] RN Cm provided Cm contact information and the 24 hour nurse line number  Offered Do not call registry for voiced concern about increased telemarketer calls   Alyiah Ulloa L. Noelle PennerGibbs, RN, BSN, CCM [facility] Telephonic Care Management Care Coordinator Office number [phone] Mobile number [phone]  Main [facility] number [phone] Fax number [phone]

## 2017-09-22 NOTE — Telephone Encounter (Signed)
Sherry RhodesBetty called again.  Pt was dc'd to take lovenox BID until INR is 2.0 but was not given orders to go out and check INR.   Dr. Gwendolyn GrantWalden gave verbal and message was relayed to [facility]. Fleeger, Sherry RochesterJessica Fitzgerald, CMA

## 2017-09-23 ENCOUNTER — Other Ambulatory Visit: Payer: Self-pay | Admitting: *Deleted

## 2017-09-23 DIAGNOSIS — I63031 Cerebral infarction due to thrombosis of right carotid artery: Secondary | ICD-10-CM

## 2017-09-23 DIAGNOSIS — I69392 Facial weakness following cerebral infarction: Secondary | ICD-10-CM | POA: Diagnosis not present

## 2017-09-23 DIAGNOSIS — Z7901 Long term (current) use of anticoagulants: Secondary | ICD-10-CM | POA: Insufficient documentation

## 2017-09-23 DIAGNOSIS — I69322 Dysarthria following cerebral infarction: Secondary | ICD-10-CM | POA: Diagnosis not present

## 2017-09-23 LAB — POCT INR: INR: 6.9 — AB (ref 2.0–3.0)

## 2017-09-23 NOTE — Progress Notes (Signed)
Home health nurse, Kathie RhodesBetty, called in INR of 6.9 for this patient. She is currently taking 5 mg coumadin daily along with Lovenox injections. No signs of bruising or bleeding. Reported critical result to Dr Gwendolyn GrantWalden. Instructions are to stop Lovenox and hold coumadin. Office appointment was made for her on Monday to follow up on hospital visit and recheck her INR and adjust coumadin dosing. Donzella Carrol, Rodena Medinobert Lee

## 2017-09-24 ENCOUNTER — Telehealth: Payer: Self-pay | Admitting: Family Medicine

## 2017-09-24 ENCOUNTER — Telehealth: Payer: Self-pay

## 2017-09-24 DIAGNOSIS — I69392 Facial weakness following cerebral infarction: Secondary | ICD-10-CM | POA: Diagnosis not present

## 2017-09-24 DIAGNOSIS — I69322 Dysarthria following cerebral infarction: Secondary | ICD-10-CM | POA: Diagnosis not present

## 2017-09-24 NOTE — Telephone Encounter (Signed)
Gave ok to speech therapy via voicemail.  Myrene BuddyJacob Iline Buchinger MD PGY-2 Family Medicine Resident

## 2017-09-24 NOTE — Telephone Encounter (Signed)
Gave verbal orders as requested  Myrene BuddyJacob Rasheida Broden MD PGY-2 Family Medicine Resident

## 2017-09-24 NOTE — Telephone Encounter (Signed)
[facility] is calling and requesting verbal orders for the patient for PT at 2 times a week for 2 weeks and then 1 time a week for 2 weeks. [phone] it is okay to leave a voicemail jw

## 2017-09-24 NOTE — Telephone Encounter (Signed)
[facility] called nurse line requesting verbal orders for speech therapy evaluation for patient. Please call [phone] for VO.

## 2017-09-27 ENCOUNTER — Ambulatory Visit (INDEPENDENT_AMBULATORY_CARE_PROVIDER_SITE_OTHER): Payer: Medicare Other | Admitting: Family Medicine

## 2017-09-27 ENCOUNTER — Other Ambulatory Visit: Payer: Self-pay

## 2017-09-27 DIAGNOSIS — Z7901 Long term (current) use of anticoagulants: Secondary | ICD-10-CM | POA: Diagnosis not present

## 2017-09-27 DIAGNOSIS — I63031 Cerebral infarction due to thrombosis of right carotid artery: Secondary | ICD-10-CM

## 2017-09-27 LAB — POCT INR: INR: 1.7 — AB (ref 2.0–3.0)

## 2017-09-27 NOTE — Progress Notes (Signed)
   Subjective:    Patient ID: Sherry Fitzgerald, female    DOB: 10-06-1945, 72 y.o.   MRN: [id number]   CC: Hospital discharge followup  HPI: Patient is a 72 yo female who present today for hospital follow up for a recent CVA. Patient was admitted with facial droop, confusion and dysarthria and found to have acute/subacute infarct in the right frontal lobe. Patient was started on warfarin with a Lovenox bridge. Patient was also started on statin therapy. INR last week was 6.9 and coumadin and Lovenox was stopped. Patient is neurologically improving and has Home Health PT/OT and speech therapy ordered. Husband reports memory issues since returning home but she is otherwise doing well and able to perform most ADLs. Patient denies any weakness, SOB, chest pain, abdominal pain or visual changes.   Smoking status reviewed   ROS: all other systems were reviewed and are negative other than in the HPI   Past Medical History:  Diagnosis Date  . Hypertension    no medications for years    Past Surgical History:  Procedure Laterality Date  . CHOLECYSTECTOMY  2014    Past medical history, surgical, family, and social history reviewed and updated in the EMR as appropriate.  Objective:  BP 105/62   Pulse 60   Temp (!) 97.5 F (36.4 C) (Oral)   Wt 119 lb (54 kg)   SpO2 98%   BMI 25.75 kg/m   Vitals and nursing note reviewed  General: NAD, pleasant, able to participate in exam Cardiac: RRR, normal heart sounds, no murmurs. 2+ radial and PT pulses bilaterally Respiratory: CTAB, normal effort, No wheezes, rales or rhonchi Abdomen: soft, nontender, nondistended, no hepatic or splenomegaly, +BS Extremities: no edema or cyanosis. WWP. Skin: warm and dry, no rashes noted Neuro: alert and oriented x4, strength 4/5 upper and lower extremities bilaterally. Sensation intact bilaterally upper and lower extremities. Rapid alternating movement, finger to nose, heel to shin. Reflexes + bilaterally    Psych: Normal affect and mood   Assessment & Plan:   CVA (cerebral vascular accident) [facility](HCC) Patient following up today after recent hospital discharge for right frontal lobe infarct. Patient Lovenox and coumadin d/ced on Friday due to INR of 6.9 from 1.03 on discharge 9/19. Patient's INR today is 1.7 almost at goal (2.0). Will stop Lovenox and start her on coumadin 5 mg M,W,F. She will follow up next week (9/30) for INR check. Unclear why patient is nota candidate for DOAC, will follow up with neurology and PCP. Patient will continue atorvastatin 80 mg. BP 105/62 continue current BP regimen.     Lovena NeighboursAbdoulaye Naiah Donahoe, MD [facility] PGY-3

## 2017-09-27 NOTE — Assessment & Plan Note (Addendum)
Patient following up today after recent hospital discharge for right frontal lobe infarct. Patient Lovenox and coumadin d/ced on Friday due to INR of 6.9 from 1.03 on discharge 9/19. Patient's INR today is 1.7 almost at goal (2.0). Will stop Lovenox and start her on coumadin 5 mg M,W,F. She will follow up next week (9/30) for INR check. Unclear why patient is nota candidate for DOAC, will follow up with neurology and PCP. Patient will continue atorvastatin 80 mg. BP 105/62 continue current BP regimen.

## 2017-09-28 ENCOUNTER — Telehealth: Payer: Self-pay | Admitting: *Deleted

## 2017-09-28 DIAGNOSIS — I69392 Facial weakness following cerebral infarction: Secondary | ICD-10-CM | POA: Diagnosis not present

## 2017-09-28 DIAGNOSIS — I69322 Dysarthria following cerebral infarction: Secondary | ICD-10-CM | POA: Diagnosis not present

## 2017-09-28 NOTE — Telephone Encounter (Signed)
Denise called to inform MD of the following:  Pts HR was 55 today.  She is not on any meds that would effect her heart rate.  Angelique BlonderDenise wonders if she needs a cardiology appt. Sueo Cullen, Maryjo RochesterJessica Dawn, CMA

## 2017-09-29 DIAGNOSIS — I69392 Facial weakness following cerebral infarction: Secondary | ICD-10-CM | POA: Diagnosis not present

## 2017-09-29 DIAGNOSIS — I69322 Dysarthria following cerebral infarction: Secondary | ICD-10-CM | POA: Diagnosis not present

## 2017-09-30 DIAGNOSIS — I69392 Facial weakness following cerebral infarction: Secondary | ICD-10-CM | POA: Diagnosis not present

## 2017-09-30 DIAGNOSIS — I69322 Dysarthria following cerebral infarction: Secondary | ICD-10-CM | POA: Diagnosis not present

## 2017-10-04 ENCOUNTER — Ambulatory Visit (INDEPENDENT_AMBULATORY_CARE_PROVIDER_SITE_OTHER): Payer: Medicare Other | Admitting: *Deleted

## 2017-10-04 ENCOUNTER — Other Ambulatory Visit: Payer: Self-pay | Admitting: *Deleted

## 2017-10-04 DIAGNOSIS — I63031 Cerebral infarction due to thrombosis of right carotid artery: Secondary | ICD-10-CM | POA: Diagnosis not present

## 2017-10-04 DIAGNOSIS — Z7901 Long term (current) use of anticoagulants: Secondary | ICD-10-CM

## 2017-10-04 DIAGNOSIS — I69392 Facial weakness following cerebral infarction: Secondary | ICD-10-CM | POA: Diagnosis not present

## 2017-10-04 DIAGNOSIS — I69322 Dysarthria following cerebral infarction: Secondary | ICD-10-CM | POA: Diagnosis not present

## 2017-10-04 LAB — POCT INR: INR: 2.8 (ref 2.0–3.0)

## 2017-10-04 MED ORDER — WARFARIN SODIUM 2 MG PO TABS: 2.0000 mg | ORAL_TABLET | Freq: Every day | ORAL | 6 refills | Status: DC

## 2017-10-04 NOTE — Progress Notes (Signed)
Patient's dosing instructions for last week was to take 5 mg Mon, Wed, Fri and 2.5 mg all other days. Instead she only took the 5 mg on Mon, Wed, Fri and none on the other days. Discussed with Dr Leveda Anna, given that she is therapeutic today we do not want to change the number of mg she receives to much but the way she takes it. Her new dosing instructions are to take 2 mg daily which is only 1 mg difference from the 5 mg she took only on 3 days last week.  Instructions were given to follow up next Monday. I had her husband repeat back to me the dosing and follow up instructions given that she does not speak english and he is the one that is translating this to her. [name], Rodena Medin

## 2017-10-04 NOTE — Progress Notes (Signed)
Was therapeutic with INR taking 5 mg three days per week = 15 mg per week.   Will even out dosing with 3 mg daily =14mg  per week.

## 2017-10-05 ENCOUNTER — Telehealth: Payer: Self-pay

## 2017-10-05 ENCOUNTER — Other Ambulatory Visit: Payer: Self-pay | Admitting: *Deleted

## 2017-10-05 DIAGNOSIS — I69322 Dysarthria following cerebral infarction: Secondary | ICD-10-CM | POA: Diagnosis not present

## 2017-10-05 DIAGNOSIS — I69392 Facial weakness following cerebral infarction: Secondary | ICD-10-CM | POA: Diagnosis not present

## 2017-10-05 DIAGNOSIS — I6310 Cerebral infarction due to embolism of unspecified precerebral artery: Secondary | ICD-10-CM

## 2017-10-05 NOTE — Patient Outreach (Signed)
[facility]) Care Management  10/05/2017  Sherry Fitzgerald 1945/02/11 [id number]   EMMI-stroke follow call and new EMMI alert   RED ON EMMI ALERT Day # 13  Date: 10/03/17 1004  Red Alert Reason: went for follow up appointment? No    Outreach attempt # 1 successful  Patient is able to verify HIPAA [facility] Care Management RN reviewed and addressed the follow up and red alert with patient Sherry and Sherry Fitzgerald reports noted improvements in Sherry Fitzgerald  She will be seen today by a [facility] nurse in the home  She has been more active and is looking forward to a golf photo outing with Sherry Fitzgerald coming soon  The red alert answer was partially correct. Sherry Fitzgerald has followed up with her [facility] resident, Dr Sydnee Cabal but Sherry Fitzgerald states they have "not been seen by a doctor"  He confirms upcoming appointments with neurology in November 2019 They both discussed that they want Sherry Fitzgerald to be seen by a cardiologist as Dr Roda Shutters, neurologist mentioned to them during Sherry Nash's hospitalization. They both voice interest in heart monitoring and a cardiology evaluation especially after reporting Sherry Fitzgerald had an episode of "heartburn" recently and one of the [facility] nurses inquired about a cardiology follow up appointment after being told about this "heartburn" episode  Social: Lives with her spouse, married for 3 years. Retired Editor, commissioning RN/medic who is now having memory concerns after CVA and needing assist with ADLs and iADLs from her husband, Sherry Fitzgerald. Sherry Fitzgerald provides transportation to appointments and is the primary caregiver. She speaks English but during the call gave permission for her husband to speak with CM   Conditions: CVA, HTN, Hyperlipidemia, plantar fasciitis, right cervical radiculopathy at c5, bilateral knee pain, right leg weakness, chest pain, hyperglycemia ( A1c 6.1 09/16/17) Medications: denies concerns with taking medications as prescribed, affording medications, side effects of medications  and questions about medications  Appointments: 10/26/17 1355 Dr Primitivo Gauze f/u stroke appointment, Dr Pearlean Brownie Neurology Noted prior to completing note an appointment was made for 11/10/17 0830  Advance Directives: Denies need for assist with or assist with changes for advance directives   Consent: [facility] RN CM reviewed [facility] services with patient. Patient gave verbal consent for services.  She denies need of services from [facility] Community/Telephonic RN CM, pharmacy, health coach, NP or SW at this time  Advised patient that there will be further automated EMMI- post discharge calls to assess how the patient is doing following the recent hospitalization Advised the patient that another call may be received from a nurse if any of their responses were abnormal. Patient voiced understanding and was appreciative of f/u call.   Plan:  [facility] RN CM to follow up with Sherry Fitzgerald in 7-10 days related to Cardiology needs and plan for case closure if no further needs identified  [facility] RN CM sent an in basket to Dr Primitivo Gauze and Dr Sydnee Cabal to request assist with a cardiology referral and 30 day monitoring the Sherry and Sherry Fitzgerald request assist with   [facility] RN CM called [facility] referral line and left a message for [facility] referral RN inquiring about assist with a cardiology referral for this patient. [facility] RN CM left her number for a return call   Kimberly L. Noelle Penner, RN, BSN, CCM [facility] Telephonic Care Management Care Coordinator Office number [phone] Mobile number [phone]  Main [facility] number [phone] Fax number [phone]

## 2017-10-05 NOTE — Telephone Encounter (Signed)
Kathie Rhodes, RN from [place] called. Patient and family understood at hospital discharge that patient was to have a cardiology consult for cardiac monitor. This referral was not placed. Request that PCP place referral.  Call back is Kathie Rhodes [phone]  Ples Specter, RN [facility] RN)

## 2017-10-06 ENCOUNTER — Other Ambulatory Visit: Payer: Self-pay | Admitting: *Deleted

## 2017-10-06 NOTE — Patient Outreach (Signed)
  [facility]) Care Management  10/06/2017  Norabelle Kondo 1945/08/07 [id number]   EMMI-stroke follow call and new EMMI alert   [facility] RN CM called and spoke with Mr &Mrs Sherry Fitzgerald  Patient is able to verify HIPAA Reviewed and addressed follow up from Dr Primitivo Gauze about cardiology referral with patient and her husband  CM discussed the message to Dr Primitivo Gauze and the call to [facility] from 10/05/17 and Dr Threasa Alpha response Also discussed that Waynetta Sandy of [facility] was noted to call [facility] also   Appointments: 10/11/17 labs 10/26/17 f/u with Dr Primitivo Gauze Discussed monitoring her activity for headaches, chest pain, sob, dizziness and reporting to MD Encouraged activity with moderation related to upcoming golfing event they want to attend. Mr Sherry Fitzgerald states he has to encourage Mrs Sherry Fitzgerald to limit her activity at times   [facility] RN CM will close case at this time as patient has been assessed and needs resolved  Pt encouraged to return a call to [facility] RN CM prn    Cala Bradford L. Noelle Penner, RN, BSN, CCM [facility] Telephonic Care Management Care Coordinator Office number [phone] Mobile number [phone]  Main [facility] number [phone] Fax number [phone]    Cala Bradford L. Noelle Penner, RN, BSN, CCM [facility] Telephonic Care Management Care Coordinator Office number [phone] Mobile number [phone]  Main [facility] number [phone] Fax number [phone]

## 2017-10-06 NOTE — Telephone Encounter (Signed)
Placed cardiology referral for event monitor.  Myrene Buddy MD PGY-2 Family Medicine Resident

## 2017-10-11 ENCOUNTER — Ambulatory Visit (INDEPENDENT_AMBULATORY_CARE_PROVIDER_SITE_OTHER): Payer: Medicare Other | Admitting: *Deleted

## 2017-10-11 ENCOUNTER — Other Ambulatory Visit: Payer: Self-pay

## 2017-10-11 DIAGNOSIS — I69392 Facial weakness following cerebral infarction: Secondary | ICD-10-CM | POA: Diagnosis not present

## 2017-10-11 DIAGNOSIS — Z7901 Long term (current) use of anticoagulants: Secondary | ICD-10-CM

## 2017-10-11 DIAGNOSIS — I63031 Cerebral infarction due to thrombosis of right carotid artery: Secondary | ICD-10-CM

## 2017-10-11 DIAGNOSIS — I69322 Dysarthria following cerebral infarction: Secondary | ICD-10-CM | POA: Diagnosis not present

## 2017-10-11 LAB — POCT INR: INR: 1.7 — AB (ref 2.0–3.0)

## 2017-10-11 MED ORDER — LISINOPRIL 40 MG PO TABS: 40.0000 mg | ORAL_TABLET | Freq: Every day | ORAL | 0 refills | Status: DC

## 2017-10-11 MED ORDER — ATORVASTATIN CALCIUM 80 MG PO TABS: 80.0000 mg | ORAL_TABLET | Freq: Every day | ORAL | 0 refills | Status: DC

## 2017-10-11 NOTE — Telephone Encounter (Signed)
Pt walked into office requesting refills on pended medications. Pt does have an upcoming apt. Pt does not have enough to last until her apt.

## 2017-10-18 ENCOUNTER — Ambulatory Visit: Payer: Medicare Other | Admitting: *Deleted

## 2017-10-18 ENCOUNTER — Other Ambulatory Visit: Payer: Self-pay | Admitting: *Deleted

## 2017-10-18 DIAGNOSIS — I69322 Dysarthria following cerebral infarction: Secondary | ICD-10-CM | POA: Diagnosis not present

## 2017-10-18 DIAGNOSIS — Z7901 Long term (current) use of anticoagulants: Secondary | ICD-10-CM

## 2017-10-18 DIAGNOSIS — I63031 Cerebral infarction due to thrombosis of right carotid artery: Secondary | ICD-10-CM

## 2017-10-18 DIAGNOSIS — I69392 Facial weakness following cerebral infarction: Secondary | ICD-10-CM | POA: Diagnosis not present

## 2017-10-18 LAB — POCT INR: INR: 2.8 (ref 2.0–3.0)

## 2017-10-18 MED ORDER — WARFARIN SODIUM 2 MG PO TABS: ORAL_TABLET | ORAL | 6 refills | Status: DC

## 2017-10-18 NOTE — Addendum Note (Signed)
Addended by: Delman Cheadle on: 10/18/2017 08:59 AM   Modules accepted: Orders

## 2017-10-18 NOTE — Progress Notes (Signed)
Entered as a "print" ordered in error. Re-prescribed as a normal order for e-prescription.  Myrene Buddy MD PGY-2 Family Medicine Resident

## 2017-10-25 DIAGNOSIS — I69322 Dysarthria following cerebral infarction: Secondary | ICD-10-CM | POA: Diagnosis not present

## 2017-10-25 DIAGNOSIS — I69392 Facial weakness following cerebral infarction: Secondary | ICD-10-CM | POA: Diagnosis not present

## 2017-10-26 ENCOUNTER — Ambulatory Visit (INDEPENDENT_AMBULATORY_CARE_PROVIDER_SITE_OTHER): Payer: Medicare Other | Admitting: Family Medicine

## 2017-10-26 VITALS — BP 120/60 | HR 53 | Temp 98.1°F | Wt 118.2 lb

## 2017-10-26 DIAGNOSIS — Z23 Encounter for immunization: Secondary | ICD-10-CM | POA: Diagnosis not present

## 2017-10-26 DIAGNOSIS — I639 Cerebral infarction, unspecified: Secondary | ICD-10-CM

## 2017-10-26 DIAGNOSIS — I10 Essential (primary) hypertension: Secondary | ICD-10-CM

## 2017-10-26 DIAGNOSIS — R739 Hyperglycemia, unspecified: Secondary | ICD-10-CM | POA: Diagnosis not present

## 2017-10-26 DIAGNOSIS — E782 Mixed hyperlipidemia: Secondary | ICD-10-CM | POA: Diagnosis not present

## 2017-10-26 DIAGNOSIS — Z7901 Long term (current) use of anticoagulants: Secondary | ICD-10-CM

## 2017-10-26 DIAGNOSIS — Z0000 Encounter for general adult medical examination without abnormal findings: Secondary | ICD-10-CM | POA: Diagnosis not present

## 2017-10-26 MED ORDER — WARFARIN SODIUM 2 MG PO TABS: ORAL_TABLET | ORAL | 6 refills | Status: DC

## 2017-10-26 NOTE — Patient Instructions (Addendum)
It was great meeting you today! I am glad that you are getting stronger and better every day from your stroke. I am sorry that you have not heard from cardiology, I will contact them and see what the status on your referral is. I am glad that you are therapeutic on your INR. Please keep your appointment to get this checked. We are not changing your warfarin dosing today. I am not sure why you are on warfarin vs a newer anticoagulant. I will try and contact your neurologist to see why.  Your blood pressure looks great. You are ok to start walking again, but please take it easy. You are also getting a flu shot and a pneumonia vaccine today. I gave you a card showing where to get a mammogram done. You can just walk in at any time to have this done.  Please let me know if there are any questions or concerns.

## 2017-10-29 ENCOUNTER — Encounter: Payer: Self-pay | Admitting: Family Medicine

## 2017-10-29 NOTE — Assessment & Plan Note (Signed)
INR goal 2-3.  Take 3 mg Monday and Thursday, 2 mg Sunday Tuesday Wednesday Friday Saturday.  Will leave anticoagulation up to neurology.  Question If switching to direct his best long-term option for patient. -Coumadin clinic follow-up on 11/01/2017 -Continue warfarin as above.

## 2017-10-29 NOTE — Progress Notes (Signed)
   HPI 72 year Fitzgerald who presents for stroke follow-up.  She was admitted to [facility] on 09/15/2017.  Report was located in the deep white matter of the right frontal lobe is felt to be embolic from 5 mm distal right MCA aneurysm.  She was placed on warfarin, atorvastatin prior to discharge.  She was to have neurology follow-up 4 weeks after discharge, and ambulatory referral to cardiology was made.  This would be for a potential loop recorder to evaluate for arrhythmias.  Since discharge patient has continued to get stronger and stronger.  She is able to walk with no issues and does not feel that she has very much residual weakness at all.  Her husband does report that she is having short-term memory issues still.  This is improving but husband has to help her remember day-to-day activity.  She is also interested in receiving a flu vaccine, Pneumovax, and getting a screening mammogram.  CC: Stroke follow-up   ROS:  Review of Systems See HPI for ROS.   CC, SH/smoking status, and VS noted  Objective: BP 120/60   Pulse (!) 53   Temp 98.1 F (36.7 C) (Oral)   Wt 118 lb 3.2 oz (53.6 kg)   SpO2 97%   BMI 25.58 kg/m  Gen: Well-appearing, middle-aged, Asian female.  No acute distress resting comfortably HEENT: NCAT, EOMI, PERRL CV: RRR, no murmur Resp: CTAB, no wheezes, non-labored Abd: SNTND, BS present, no guarding or organomegaly Ext: No edema, warm Neuro: CN II through XII intact, no focal neurologic deficits.  Alert oriented x3.  5 strength bilateral upper extremity, bilateral lower extremity   Assessment and plan:  CVA (cerebral vascular accident) (HCC) Continues to improve.  Has follow-up scheduled with neurology on 11/10/2017.  Outpatient speech.  Referral placed for cardiology, patient still awaiting to hear back - stroke team follow up - cardiology referral placed - home health speech - Continue warfarin, INR goal 2-3  Long term (current) use of  anticoagulants INR goal 2-3.  Take 3 mg Monday and Thursday, 2 mg Sunday Tuesday Wednesday Friday Saturday.  Will leave anticoagulation up to neurology.  Question If switching to direct his best long-term option for patient. -Coumadin clinic follow-up on 11/01/2017 -Continue warfarin as above.  Health care maintenance Received flu vaccine, Pneumovax, and gave instructions for screening mammogram.  HYPERTENSION, BENIGN SYSTEMIC BP 120/60.  Continue lisinopril 40 mg.  Last potassium 4.2, creatinine 0.6 last BMP in 09/2017. - Continue lisinopril 40 mg  Hyperlipidemia Left with a panel from 09/2017 showing cholesterol 206, HDL 50, LDL 180.  On high-intensity statin form of atorvastatin 80 mg. -Continue atorvastatin 80 mg -Recheck lipid panel on 09/2018  Hyperglycemia Hemoglobin A1c 6.1 and 09/2017.  Can manage with diet and exercise for now.  Could consider starting metformin in future. -Diet and exercise -Recheck A1c 12,019   Orders Placed This Encounter  Procedures  . Flu Vaccine QUAD 36+ mos IM  . Pneumococcal polysaccharide vaccine 23-valent greater than or equal to 2yo subcutaneous/IM    Meds ordered this encounter  Medications  . warfarin (COUMADIN) 2 MG tablet    Sig: Take 3 mg (1.5 tabs) Mon and Thurs Take 2 mg (1 tab) Sun, Tues, Wed, Fri, Sat    Dispense:  60 tablet    Refill:  6     Myrene Buddy MD PGY-2 Family Medicine Resident  10/29/2017 8:Sherry AM

## 2017-10-29 NOTE — Assessment & Plan Note (Signed)
Received flu vaccine, Pneumovax, and gave instructions for screening mammogram.

## 2017-10-29 NOTE — Assessment & Plan Note (Signed)
Continues to improve.  Has follow-up scheduled with neurology on 11/10/2017.  Outpatient speech.  Referral placed for cardiology, patient still awaiting to hear back - stroke team follow up - cardiology referral placed - home health speech - Continue warfarin, INR goal 2-3

## 2017-10-29 NOTE — Assessment & Plan Note (Signed)
Left with a panel from 09/2017 showing cholesterol 206, HDL 50, LDL 180.  On high-intensity statin form of atorvastatin 80 mg. -Continue atorvastatin 80 mg -Recheck lipid panel on 09/2018

## 2017-10-29 NOTE — Assessment & Plan Note (Signed)
Hemoglobin A1c 6.1 and 09/2017.  Can manage with diet and exercise for now.  Could consider starting metformin in future. -Diet and exercise -Recheck A1c 12,019

## 2017-10-29 NOTE — Assessment & Plan Note (Signed)
BP 120/60.  Continue lisinopril 40 mg.  Last potassium 4.2, creatinine 0.6 last BMP in 09/2017. - Continue lisinopril 40 mg

## 2017-11-01 ENCOUNTER — Ambulatory Visit (INDEPENDENT_AMBULATORY_CARE_PROVIDER_SITE_OTHER): Payer: Medicare Other | Admitting: *Deleted

## 2017-11-01 DIAGNOSIS — I69392 Facial weakness following cerebral infarction: Secondary | ICD-10-CM | POA: Diagnosis not present

## 2017-11-01 DIAGNOSIS — Z7901 Long term (current) use of anticoagulants: Secondary | ICD-10-CM

## 2017-11-01 DIAGNOSIS — I63031 Cerebral infarction due to thrombosis of right carotid artery: Secondary | ICD-10-CM | POA: Diagnosis not present

## 2017-11-01 DIAGNOSIS — I69322 Dysarthria following cerebral infarction: Secondary | ICD-10-CM | POA: Diagnosis not present

## 2017-11-01 LAB — POCT INR: INR: 2.9 (ref 2.0–3.0)

## 2017-11-03 ENCOUNTER — Other Ambulatory Visit: Payer: Self-pay | Admitting: Family Medicine

## 2017-11-07 ENCOUNTER — Other Ambulatory Visit: Payer: Self-pay | Admitting: Family Medicine

## 2017-11-08 ENCOUNTER — Other Ambulatory Visit: Payer: Self-pay | Admitting: Family Medicine

## 2017-11-08 DIAGNOSIS — I69322 Dysarthria following cerebral infarction: Secondary | ICD-10-CM | POA: Diagnosis not present

## 2017-11-08 DIAGNOSIS — I69392 Facial weakness following cerebral infarction: Secondary | ICD-10-CM | POA: Diagnosis not present

## 2017-11-08 MED ORDER — ATORVASTATIN CALCIUM 80 MG PO TABS: 80.0000 mg | ORAL_TABLET | Freq: Every day | ORAL | 0 refills | Status: AC

## 2017-11-10 ENCOUNTER — Telehealth: Payer: Self-pay | Admitting: Neurology

## 2017-11-10 ENCOUNTER — Ambulatory Visit (INDEPENDENT_AMBULATORY_CARE_PROVIDER_SITE_OTHER): Payer: Medicare Other | Admitting: Neurology

## 2017-11-10 ENCOUNTER — Encounter: Payer: Self-pay | Admitting: Neurology

## 2017-11-10 VITALS — BP 147/71 | HR 61 | Ht <= 58 in | Wt 116.0 lb

## 2017-11-10 DIAGNOSIS — I63411 Cerebral infarction due to embolism of right middle cerebral artery: Secondary | ICD-10-CM

## 2017-11-10 DIAGNOSIS — I6521 Occlusion and stenosis of right carotid artery: Secondary | ICD-10-CM | POA: Diagnosis not present

## 2017-11-10 DIAGNOSIS — G3184 Mild cognitive impairment, so stated: Secondary | ICD-10-CM

## 2017-11-10 NOTE — Telephone Encounter (Signed)
Patient is aware  I also gave her their number of [phone] and to give them a call if she has not heard in the next 2-3 business days.

## 2017-11-10 NOTE — Progress Notes (Signed)
[facility] [address] [place]. [place] [address] [phone]       OFFICE CONSULT NOTE  [name] Date of Birth:  November 18, 1945 Medical Record Number:  [id number]   Referring MD:  Marvel Plan Reason for Referral: Stroke follow-up  HPI: [name] is a pleasant 72 year old [place] lady who is seen today for initial office consultation visit for stroke.  She is accompanied by her husband.  History is obtained from them and review of electronic medical records.  I have personally reviewed imaging films. HPI ( Dr Wilford Corner ) Mardi Mainland Lamira Borin is an 72 y.o. female with a PMH of HTN who presented to the ED with a 2 day history of speech changes, AMS and left face weakness.Per patient family member these symptoms have been intermittent for weeks, but for the past 2 days the patient has had increasing confusion. She is doing odd things like attempting to make coffee w/o coffee grounds. She is becoming more easily agitated and argues about the time. 2 days ago he noticed that the left side of her face seemed different and that she was not speaking as clearly. Denies any ETOH, or drug abuse. Denies any CP or SOB. Due to the persistence of symptoms and then becoming more frequent as well as the facial droop, there has been decided to bring the patient to the emergency room for evaluation.Noncontrast CT of the head was done that showed a hypodensity suggestive of subacute infarct in the right basal ganglia/caudate.Neurological consultation was obtained for further evaluation. .LKW: 2-4 day ago; uncertain of time NIHSS: 3 MRI scan of the brain showed right frontal deep white matter infarct extending into the head of the caudate with scattered peripheral cortical right frontal infarcts.  MRI of the brain showed a 5 mm distal right MCA aneurysm.  2D echo showed normal ejection fraction without cardiac source of embolus.  LDL cholesterol was elevated at 180 mg percent.   Hemoglobin A1c was 6.1.  CT angiogram of the head and neck showed right common carotid artery thrombus which was nonocclusive.  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  Patient was felt to have embolized distally from the right common carotid artery thrombus and hence was started on anticoagulation with warfarin and on Lipitor for his elevated lipids.  Patient is currently living at home with her husband.  She has significant cognitive impairment with short-term memory being very poor.  She is also quite slow to process information and has trouble with multitasking.  She requires supervision for activities like cooking and working in the kitchen which she used to love.  She has left the burners on several times while cooking now.  She is tolerating warfarin well without bleeding or bruising and INR has been fairly stable.  Her facial droop and speech appear to have improved.  Patient's husband is wondering when her cognitive difficulties are going to improve and whether she will be able to become fully independent.  She has no prior history of cognitive impairment or memory difficulties.  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  ROS:   14 system review of systems is positive for weight loss, hearing loss, cough, memory loss, depression, anxiety, decreased energy, change in appetite, sleepiness and all other systems negative PMH:  Past Medical History:  Diagnosis Date  . Cerebrovascular accident (CVA) (HCC)   . Hypertension    no medications for years  Social History:  Social History   Socioeconomic History  . Marital status: Married    Spouse name: Not on file  . Number of children: Not on file  . Years of education: Not on file  . Highest education level: Not on file  Occupational History  . Occupation: retired  Engineer, production  . Financial resource strain: Not on file  . Food insecurity:    Worry: Not on file    Inability: Not on  file  . Transportation needs:    Medical: Not on file    Non-medical: Not on file  Tobacco Use  . Smoking status: Never Smoker  . Smokeless tobacco: Never Used  Substance and Sexual Activity  . Alcohol use: Never    Frequency: Never  . Drug use: Never  . Sexual activity: Not on file  Lifestyle  . Physical activity:    Days per week: Not on file    Minutes per session: Not on file  . Stress: Not on file  Relationships  . Social connections:    Talks on phone: Not on file    Gets together: Not on file    Attends religious service: Not on file    Active member of club or organization: Not on file    Attends meetings of clubs or organizations: Not on file    Relationship status: Not on file  . Intimate partner violence:    Fear of current or ex partner: Not on file    Emotionally abused: Not on file    Physically abused: Not on file    Forced sexual activity: Not on file  Other Topics Concern  . Not on file  Social History Narrative   Former Editor, commissioning.  Lives alone, but has 5 children in the area who are grown and supportive.  +Financial difficulties - trouble paying bills.  She has been married to her current husband for 3 years.    Medications:   Current Outpatient Medications on File Prior to Visit  Medication Sig Dispense Refill  . atorvastatin (LIPITOR) 80 MG tablet Take 1 tablet (80 mg total) by mouth daily. 90 tablet 0  . lisinopril (PRINIVIL,ZESTRIL) 40 MG tablet TAKE 1 TABLET BY MOUTH EVERY DAY 30 tablet 0  . warfarin (COUMADIN) 2 MG tablet Take 3 mg (1.5 tabs) Mon and Thurs Take 2 mg (1 tab) Sun, Tues, Wed, Fri, Sat 60 tablet 6   No current facility-administered medications on file prior to visit.     Allergies:  No Known Allergies  Physical Exam General: petite frail [place] lady, seated, in no evident distress Head: head normocephalic and atraumatic.   Neck: supple with no carotid or supraclavicular bruits Cardiovascular: regular rate and  rhythm, no murmurs Musculoskeletal: no deformity Skin:  no rash/petichiae Vascular:  Normal pulses all extremities  Neurologic Exam Mental Status: Exam difficult due to language barrier and interpreter is necessary.  Appears to be disoriented and slightly confused but follows simple commands well.  Awake and fully alert. Oriented to place and time. Recent and remote memory intact. Attention span, concentration and fund of knowledge appropriate. Mood and affect appropriate.  Diminished recall 0/3.  Able to name 11 animals with [employer]ole[employer]. Cranial Nerves: Fundoscopic exam reveals sharp disc margins. Pupils equal, briskly reactive to light. Extraocular movements full without nystagmus. Visual fields full to confrontation. Hearing intact. Facial sensation intact. Face, tongue, palate moves normally and symmetrically.  Motor: Normal bulk and tone. Normal strength in all tested extremity  muscles.  Except diminished fine finger movements on the left.  Orbits right over left upper extremity. Sensory.: intact to touch , pinprick , position and vibratory sensation.  Coordination: Rapid alternating movements normal in all extremities. Finger-to-nose and heel-to-shin performed accurately bilaterally. Gait and Station: Arises from chair without difficulty. Stance is normal. Gait demonstrates normal stride length and balance .  Unable to heel, toe and tandem walk   Reflexes: 1+ and symmetric. Toes downgoing.   NIHSS 2 Modified Rankin  4   ASSESSMENT: 72 year old lady with right MCA branch infarct in September 2019 secondary to thromboembolism from right carotid thrombus with significant residual vascular cognitive impairment.  Vascular risk factors of hypertension, carotid thrombus and hyperlipidemia.     PLAN: I had a long d/w patient and her husband  about her recent stroke,right carotid thrombus, vascular cognitive impairment risk for recurrent stroke/TIAs, personally independently reviewed imaging studies  and stroke evaluation results and answered questions.Continue warfarin daily  for secondary stroke prevention  For now but if f/u CTangiogram shows resolution of clot will change to aspirin and maintain strict control of hypertension with blood pressure goal below 130/90, diabetes with hemoglobin A1c goal below 6.5% and lipids with LDL cholesterol goal below 70 mg/dL. I also advised the patient to eat a healthy diet with plenty of whole grains, cereals, fruits and vegetables, exercise regularly and maintain ideal body weight. Check dementia panel labs, EEG.We discussed memory compensation strategies.  Greater than 50% time during this 45-minute consultation visit was spent on counseling and coordination of care about her stroke, carotid thrombus, cognitive impairment and vascular dementia and answering questions followup in the future with me in 2 months or call earlier if necessary. Delia Heady, MD  [facility] [address] [place], [place] [address]  Phone [phone] Fax [phone] Note: This document was prepared with digital dictation and possible smart phrase technology. Any transcriptional errors that result from this process are unintentional.

## 2017-11-10 NOTE — Telephone Encounter (Signed)
Medicare/medicaid order sent to [facility]. No auth they will reach out to the pt to schedule.  °

## 2017-11-10 NOTE — Patient Instructions (Addendum)
I had a long d/w patient and her husband  about her recent stroke,right carotid thrombus, vascular cognitive impairment risk for recurrent stroke/TIAs, personally independently reviewed imaging studies and stroke evaluation results and answered questions.Continue warfarin daily  for secondary stroke prevention  For now but if f/u CTangiogram shows resolution of clot will change to aspirin and maintain strict control of hypertension with blood pressure goal below 130/90, diabetes with hemoglobin A1c goal below 6.5% and lipids with LDL cholesterol goal below 70 mg/dL. I also advised the patient to eat a healthy diet with plenty of whole grains, cereals, fruits and vegetables, exercise regularly and maintain ideal body weight. Check dementia panel labs, EEG.We discussed memory compensation strategies. Followup in the future with me in 2 months or call earlier if necessary.  Stroke Prevention Some medical conditions and behaviors are associated with a higher chance of having a stroke. You can help prevent a stroke by making nutrition, lifestyle, and other changes, including managing any medical conditions you may have. What nutrition changes can be made?  Eat healthy foods. You can do this by: ? Choosing foods high in fiber, such as fresh fruits and vegetables and whole grains. ? Eating at least 5 or more servings of fruits and vegetables a day. Try to fill half of your plate at each meal with fruits and vegetables. ? Choosing lean protein foods, such as lean cuts of meat, poultry without skin, fish, tofu, beans, and nuts. ? Eating low-fat dairy products. ? Avoiding foods that are high in salt (sodium). This can help lower blood pressure. ? Avoiding foods that have saturated fat, trans fat, and cholesterol. This can help prevent high cholesterol. ? Avoiding processed and premade foods.  Follow your health care provider's specific guidelines for losing weight, controlling high blood pressure  (hypertension), lowering high cholesterol, and managing diabetes. These may include: ? Reducing your daily calorie intake. ? Limiting your daily sodium intake to 1,500 milligrams (mg). ? Using only healthy fats for cooking, such as olive oil, canola oil, or sunflower oil. ? Counting your daily carbohydrate intake. What lifestyle changes can be made?  Maintain a healthy weight. Talk to your health care provider about your ideal weight.  Get at least 30 minutes of moderate physical activity at least 5 days a week. Moderate activity includes brisk walking, biking, and swimming.  Do not use any products that contain nicotine or tobacco, such as cigarettes and e-cigarettes. If you need help quitting, ask your health care provider. It may also be helpful to avoid exposure to secondhand smoke.  Limit alcohol intake to no more than 1 drink a day for nonpregnant women and 2 drinks a day for men. One drink equals 12 oz of beer, 5 oz of wine, or 1 oz of hard liquor.  Stop any illegal drug use.  Avoid taking birth control pills. Talk to your health care provider about the risks of taking birth control pills if: ? You are over 27 years old. ? You smoke. ? You get migraines. ? You have ever had a blood clot. What other changes can be made?  Manage your cholesterol levels. ? Eating a healthy diet is important for preventing high cholesterol. If cholesterol cannot be managed through diet alone, you may also need to take medicines. ? Take any prescribed medicines to control your cholesterol as told by your health care provider.  Manage your diabetes. ? Eating a healthy diet and exercising regularly are important parts of managing your blood sugar.  If your blood sugar cannot be managed through diet and exercise, you may need to take medicines. ? Take any prescribed medicines to control your diabetes as told by your health care provider.  Control your hypertension. ? To reduce your risk of stroke, try  to keep your blood pressure below 130/80. ? Eating a healthy diet and exercising regularly are an important part of controlling your blood pressure. If your blood pressure cannot be managed through diet and exercise, you may need to take medicines. ? Take any prescribed medicines to control hypertension as told by your health care provider. ? Ask your health care provider if you should monitor your blood pressure at home. ? Have your blood pressure checked every year, even if your blood pressure is normal. Blood pressure increases with age and some medical conditions.  Get evaluated for sleep disorders (sleep apnea). Talk to your health care provider about getting a sleep evaluation if you snore a lot or have excessive sleepiness.  Take over-the-counter and prescription medicines only as told by your health care provider. Aspirin or blood thinners (antiplatelets or anticoagulants) may be recommended to reduce your risk of forming blood clots that can lead to stroke.  Make sure that any other medical conditions you have, such as atrial fibrillation or atherosclerosis, are managed. What are the warning signs of a stroke? The warning signs of a stroke can be easily remembered as BEFAST.  B is for balance. Signs include: ? Dizziness. ? Loss of balance or coordination. ? Sudden trouble walking.  E is for eyes. Signs include: ? A sudden change in vision. ? Trouble seeing.  F is for face. Signs include: ? Sudden weakness or numbness of the face. ? The face or eyelid drooping to one side.  A is for arms. Signs include: ? Sudden weakness or numbness of the arm, usually on one side of the body.  S is for speech. Signs include: ? Trouble speaking (aphasia). ? Trouble understanding.  T is for time. ? These symptoms may represent a serious problem that is an emergency. Do not wait to see if the symptoms will go away. Get medical help right away. Call your local emergency services (911 in the  [place] Do not drive yourself to the hospital.  Other signs of stroke may include: ? A sudden, severe headache with no known cause. ? Nausea or vomiting. ? Seizure.  Where to find more information: For more information, visit:  American Stroke Association: www.strokeassociation.org  National Stroke Association: www.stroke.org  Summary  You can prevent a stroke by eating healthy, exercising, not smoking, limiting alcohol intake, and managing any medical conditions you may have.  Do not use any products that contain nicotine or tobacco, such as cigarettes and e-cigarettes. If you need help quitting, ask your health care provider. It may also be helpful to avoid exposure to secondhand smoke.  Remember BEFAST for warning signs of stroke. Get help right away if you or a loved one has any of these signs. This information is not intended to replace advice given to you by your health care provider. Make sure you discuss any questions you have with your health care provider. Document Released: 01/30/2004 Document Revised: 01/28/2016 Document Reviewed: 01/28/2016 Elsevier Interactive Patient Education  2018 [employer]. Memory Compensation Strategies  1. Use "WARM" strategy.  W= write it down  A= associate it  R= repeat it  M= make a mental note  2.   You can keep a Glass blower/designer.  Use a 3-ring notebook with sections for the following: calendar, important names and phone numbers,  medications, doctors' names/phone numbers, lists/reminders, and a section to journal what you did  each day.   3.    Use a calendar to write appointments down.  4.    Write yourself a schedule for the day.  This can be placed on the calendar or in a separate section of the Memory Notebook.  Keeping a  regular schedule can help memory.  5.    Use medication organizer with sections for each day or morning/evening pills.  You may need help loading it  6.    Keep a basket, or pegboard by the door.  Place  items that you need to take out with you in the basket or on the pegboard.  You may also want to  include a message board for reminders.  7.    Use sticky notes.  Place sticky notes with reminders in a place where the task is performed.  For example: " turn off the  stove" placed by the stove, "lock the door" placed on the door at eye level, " take your medications" on  the bathroom mirror or by the place where you normally take your medications.  8.    Use alarms/timers.  Use while cooking to remind yourself to check on food or as a reminder to take your medicine, or as a  reminder to make a call, or as a reminder to perform another task, etc.

## 2017-11-11 LAB — DEMENTIA PANEL
HOMOCYSTEINE: 10.9 umol/L (ref 0.0–15.0)
RPR Ser Ql: NONREACTIVE
TSH: 1.95 u[IU]/mL (ref 0.450–4.500)
Vitamin B-12: 412 pg/mL (ref 232–1245)

## 2017-11-16 ENCOUNTER — Telehealth: Payer: Self-pay

## 2017-11-16 NOTE — Telephone Encounter (Signed)
Rn call patients phone.Pts husband pick up the phone He is on her Dpr. Rn stated the lab work for memory loss was normal. The husband verbalized understanding. ------

## 2017-11-16 NOTE — Telephone Encounter (Signed)
-----   Message from Sherry RileyPramod S Sethi, MD sent at 11/12/2017 11:21 AM EST ----- Kindly inform patient that lab work for reversible causes of memory loss was all normal

## 2017-11-19 ENCOUNTER — Ambulatory Visit
Admission: RE | Admit: 2017-11-19 | Discharge: 2017-11-19 | Disposition: A | Discharge status: Home / Self Care | Payer: Medicare Other | Source: Ambulatory Visit | Attending: Neurology | Admitting: Neurology

## 2017-11-19 DIAGNOSIS — I6521 Occlusion and stenosis of right carotid artery: Secondary | ICD-10-CM

## 2017-11-19 DIAGNOSIS — I639 Cerebral infarction, unspecified: Secondary | ICD-10-CM | POA: Diagnosis not present

## 2017-11-19 DIAGNOSIS — I6523 Occlusion and stenosis of bilateral carotid arteries: Secondary | ICD-10-CM | POA: Diagnosis not present

## 2017-11-19 MED ORDER — IOPAMIDOL (ISOVUE-370) INJECTION 76%
75.0000 mL | Freq: Once | INTRAVENOUS | Status: AC | PRN
  Administered 2017-11-19: 75 mL via INTRAVENOUS

## 2017-11-22 ENCOUNTER — Ambulatory Visit (INDEPENDENT_AMBULATORY_CARE_PROVIDER_SITE_OTHER): Payer: Medicare Other | Admitting: *Deleted

## 2017-11-22 DIAGNOSIS — Z7901 Long term (current) use of anticoagulants: Secondary | ICD-10-CM | POA: Diagnosis not present

## 2017-11-22 DIAGNOSIS — I63031 Cerebral infarction due to thrombosis of right carotid artery: Secondary | ICD-10-CM | POA: Diagnosis not present

## 2017-11-22 LAB — POCT INR: INR: 2.2 (ref 2.0–3.0)

## 2017-11-24 ENCOUNTER — Telehealth: Payer: Self-pay

## 2017-11-24 NOTE — Telephone Encounter (Signed)
-----   Message from Micki RileyPramod S Sethi, MD sent at 11/20/2017 10:50 AM EST ----- Kindly inform patient that f/u CT angio shows complete resolution of carotid clot and she can stop warfarin and start aspirin 325 mg daily instead

## 2017-11-24 NOTE — Telephone Encounter (Signed)
Rn spoke with husband on dpr form. Rn stated the Ct angio head shows complete resolution of carotid clot. PT can stop the coumadin per Dr. Pearlean BrownieSethi and start aspirin 325 daily in the am. The husband verbalized understanding, and knows he can buy aspirin 325 otc. ------

## 2017-11-30 ENCOUNTER — Other Ambulatory Visit: Payer: Self-pay | Admitting: Family Medicine

## 2017-11-30 NOTE — Telephone Encounter (Signed)
Sherry Fitzgerald,   Could you please reroute this prescription refill to Dr. Primitivo GauzeFletcher who is this patient PCP. I saw her once for hospital follow up.  Thank you  Lovena NeighboursAbdoulaye Marthann Abshier, MD [facility], PGY-3

## 2017-12-01 ENCOUNTER — Ambulatory Visit (INDEPENDENT_AMBULATORY_CARE_PROVIDER_SITE_OTHER): Payer: Medicare Other

## 2017-12-01 DIAGNOSIS — R410 Disorientation, unspecified: Secondary | ICD-10-CM | POA: Diagnosis not present

## 2017-12-01 DIAGNOSIS — G3184 Mild cognitive impairment, so stated: Secondary | ICD-10-CM

## 2017-12-20 ENCOUNTER — Ambulatory Visit: Payer: Medicare Other

## 2017-12-23 ENCOUNTER — Other Ambulatory Visit: Payer: Self-pay | Admitting: Family Medicine

## 2018-01-14 ENCOUNTER — Other Ambulatory Visit: Payer: Self-pay | Admitting: Family Medicine

## 2018-01-17 DIAGNOSIS — R0683 Snoring: Secondary | ICD-10-CM | POA: Diagnosis not present

## 2018-01-17 DIAGNOSIS — F0390 Unspecified dementia without behavioral disturbance: Secondary | ICD-10-CM | POA: Diagnosis not present

## 2018-01-17 DIAGNOSIS — Z1331 Encounter for screening for depression: Secondary | ICD-10-CM | POA: Diagnosis not present

## 2018-01-17 DIAGNOSIS — I10 Essential (primary) hypertension: Secondary | ICD-10-CM | POA: Diagnosis not present

## 2018-01-17 DIAGNOSIS — Z8679 Personal history of other diseases of the circulatory system: Secondary | ICD-10-CM | POA: Diagnosis not present

## 2018-01-17 DIAGNOSIS — R05 Cough: Secondary | ICD-10-CM | POA: Diagnosis not present

## 2018-01-17 DIAGNOSIS — Z6824 Body mass index (BMI) 24.0-24.9, adult: Secondary | ICD-10-CM | POA: Diagnosis not present

## 2018-01-17 DIAGNOSIS — E7849 Other hyperlipidemia: Secondary | ICD-10-CM | POA: Diagnosis not present

## 2018-01-18 ENCOUNTER — Encounter: Payer: Self-pay | Admitting: Neurology

## 2018-01-18 ENCOUNTER — Ambulatory Visit (INDEPENDENT_AMBULATORY_CARE_PROVIDER_SITE_OTHER): Payer: Medicare Other | Admitting: Neurology

## 2018-01-18 VITALS — BP 121/63 | HR 59 | Ht <= 58 in | Wt 111.6 lb

## 2018-01-18 DIAGNOSIS — F0150 Vascular dementia without behavioral disturbance: Secondary | ICD-10-CM | POA: Diagnosis not present

## 2018-01-18 MED ORDER — MEMANTINE HCL 5 MG PO TABS: 5.0000 mg | ORAL_TABLET | Freq: Two times a day (BID) | ORAL | Status: DC

## 2018-01-18 NOTE — Patient Instructions (Signed)
I had a long discussion with the patient and her husband regarding her post stroke cognitive impairment/mild vascular dementia which does not appear to be improving and work-up for reversible causes has been negative.  I recommended treatment trial of Namenda starting 5 mg twice daily for a month to be increase if tolerated to 10 mg twice daily.  I have discussed possible side effects with the patient and husband and asked him to call me if needed.  Continue aspirin for stroke prevention and strict control of hypertension with blood pressure goal below 130/90 and lipids with LDL cholesterol goal below 70 mg percent.  She was also advised to eat a healthy diet with lots of fruits vegetables, cereals and to be active.  She was also encouraged to increase participation in cognitively challenging activities like solving crossword puzzles, playing sudoku and bridge.  We also discussed memory compensation strategies.  Memantine Tablets What is this medicine? MEMANTINE (MEM an teen) is used to treat dementia caused by Alzheimer's disease. This medicine may be used for other purposes; ask your health care provider or pharmacist if you have questions. COMMON BRAND NAME(S): Namenda What should I tell my health care provider before I take this medicine? They need to know if you have any of these conditions: -difficulty passing urine -kidney disease -liver disease -seizures -an unusual or allergic reaction to memantine, other medicines, foods, dyes, or preservatives -pregnant or trying to get pregnant -breast-feeding How should I use this medicine? Take this medicine by mouth with a glass of water. Follow the directions on the prescription label. You may take this medicine with or without food. Take your doses at regular intervals. Do not take your medicine more often than directed. Continue to take your medicine even if you feel better. Do not stop taking except on the advice of your doctor or health care  professional. Talk to your pediatrician regarding the use of this medicine in children. Special care may be needed. Overdosage: If you think you have taken too much of this medicine contact a poison control center or emergency room at once. NOTE: This medicine is only for you. Do not share this medicine with others. What if I miss a dose? If you miss a dose, take it as soon as you can. If it is almost time for your next dose, take only that dose. Do not take double or extra doses. If you do not take your medicine for several days, contact your health care provider. Your dose may need to be changed. What may interact with this medicine? -acetazolamide -amantadine -cimetidine -dextromethorphan -dofetilide -hydrochlorothiazide -ketamine -metformin -methazolamide -quinidine -ranitidine -sodium bicarbonate -triamterene This list may not describe all possible interactions. Give your health care provider a list of all the medicines, herbs, non-prescription drugs, or dietary supplements you use. Also tell them if you smoke, drink alcohol, or use illegal drugs. Some items may interact with your medicine. What should I watch for while using this medicine? Visit your doctor or health care professional for regular checks on your progress. Check with your doctor or health care professional if there is no improvement in your symptoms or if they get worse. You may get drowsy or dizzy. Do not drive, use machinery, or do anything that needs mental alertness until you know how this drug affects you. Do not stand or sit up quickly, especially if you are an older patient. This reduces the risk of dizzy or fainting spells. Alcohol can make you more drowsy and dizzy.  Avoid alcoholic drinks. What side effects may I notice from receiving this medicine? Side effects that you should report to your doctor or health care professional as soon as possible: -allergic reactions like skin rash, itching or hives, swelling  of the face, lips, or tongue -agitation or a feeling of restlessness -depressed mood -dizziness -hallucinations -redness, blistering, peeling or loosening of the skin, including inside the mouth -seizures -vomiting Side effects that usually do not require medical attention (report to your doctor or health care professional if they continue or are bothersome): -constipation -diarrhea -headache -nausea -trouble sleeping This list may not describe all possible side effects. Call your doctor for medical advice about side effects. You may report side effects to FDA at 1-800-FDA-1088. Where should I keep my medicine? Keep out of the reach of children. Store at room temperature between 15 degrees and 30 degrees C (59 degrees and 86 degrees F). Throw away any unused medicine after the expiration date. NOTE: This sheet is a summary. It may not cover all possible information. If you have questions about this medicine, talk to your doctor, pharmacist, or health care provider.  2019 Elsevier/Gold Standard (2012-10-10 14:10:42)  She will return for follow-up in 3 months with my nurse practitioner Shanda Bumps or call earlier if necessary.

## 2018-01-18 NOTE — Progress Notes (Signed)
[facility] [address] [place]. [place] [id number] [phone]       OFFICE FOLLOW UP VISIT NOTE  Ms. Darrin LuisKBen S Kon Nash Date of Birth:  August 26, 1945 Medical Record Number:  [id number]   Referring MD:  Marvel PlanJindong Xu Reason for Referral: Stroke follow-up  UVO:[id number]:Initial visit 11/10/2017 ; Ms Kevan NyKon Nash is a pleasant 73 year old [place] lady who is seen today for initial office consultation visit for stroke.  She is accompanied by her husband.  History is obtained from them and review of electronic medical records.  I have personally reviewed imaging films. HPI ( Dr Wilford CornerArora ) Mardi MainlandK [name] Sa Kevan NyKon Nash is an 73 y.o. female with a PMH of HTN who presented to the ED with a 2 day history of speech changes, AMS and left face weakness.Per patient family member these symptoms have been intermittent for weeks, but for the past 2 days the patient has had increasing confusion. She is doing odd things like attempting to make coffee w/o coffee grounds. She is becoming more easily agitated and argues about the time. 2 days ago he noticed that the left side of her face seemed different and that she was not speaking as clearly. Denies any ETOH, or drug abuse. Denies any CP or SOB. Due to the persistence of symptoms and then becoming more frequent as well as the facial droop, there has been decided to bring the patient to the emergency room for evaluation.Noncontrast CT of the head was done that showed a hypodensity suggestive of subacute infarct in the right basal ganglia/caudate.Neurological consultation was obtained for further evaluation. .LKW: 2-4 day ago; uncertain of time NIHSS: 3 MRI scan of the brain showed right frontal deep white matter infarct extending into the head of the caudate with scattered peripheral cortical right frontal infarcts.  MRI of the brain showed a 5 mm distal right MCA aneurysm.  2D echo showed normal ejection fraction without cardiac source of embolus.  LDL cholesterol was  elevated at 180 mg percent.  Hemoglobin A1c was 6.1.  CT angiogram of the head and neck showed right common carotid artery thrombus which was nonocclusive.  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  Patient was felt to have embolized distally from the right common carotid artery thrombus and hence was started on anticoagulation with warfarin and on Lipitor for his elevated lipids.  Patient is currently living at home with her husband.  She has significant cognitive impairment with short-term memory being very poor.  She is also quite slow to process information and has trouble with multitasking.  She requires supervision for activities like cooking and working in the kitchen which she used to love.  She has left the burners on several times while cooking now.  She is tolerating warfarin well without bleeding or bruising and INR has been fairly stable.  Her facial droop and speech appear to have improved.  Patient's husband is wondering when her cognitive difficulties are going to improve and whether she will be able to become fully independent.  She has no prior history of cognitive impairment or memory difficulties.  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 Update 01/18/2018 : She returns for follow-up after last visit 2 months ago.  She is accompanied by her husband.  She continues to have cognitive impairment and memory difficulties which are not improving.  She has very poor short-term memory.  She cannot be left alone.  She needs constant reminders  and supervision.  She is unable to do things that she used to do in the past and husband needs to keep a close eye on her.  She did undergo lab work for reversible causes of cognitive impairment on 11/10/2017 and vitamin B12, TSH and RPR were all normal.  EEG done on 12/07/2017 was also normal.  CT angiogram of the brain and neck done on 11/19/2017 showed no significant for current intra-and extracranial stenosis  and the previously seen right carotid thrombus had completely resolved.  Patient has poor appetite and has weight loss and hence would not be a good candidate for Aricept.  She has not tried [place] yet.  She is tolerating aspirin well without any bleeding or bruising.  She has stopped Coumadin after her CT Angie results showed resolution of the clot.  Her blood pressure is well controlled and today it is 121/71.  The patient was having a dry cough and her primary physician Dr. Jarold Motto plans to change lisinopril to alternative blood pressure medicine.  She remains on Lipitor which is tolerating well without any side effects.  She has not had any recurrent stroke or TIA symptoms. ROS:   14 system review of systems is positive for weight loss, hearing loss,  , memory loss,  , change in appetite, cough, cold intolerance, confusion and all other systems negative PMH:  Past Medical History:  Diagnosis Date  . Cerebrovascular accident (CVA) (HCC)   . Hypertension    no medications for years    Social History:  Social History   Socioeconomic History  . Marital status: Married    Spouse name: Not on file  . Number of children: Not on file  . Years of education: Not on file  . Highest education level: Not on file  Occupational History  . Occupation: retired  Engineer, production  . Financial resource strain: Not on file  . Food insecurity:    Worry: Not on file    Inability: Not on file  . Transportation needs:    Medical: Not on file    Non-medical: Not on file  Tobacco Use  . Smoking status: Never Smoker  . Smokeless tobacco: Never Used  Substance and Sexual Activity  . Alcohol use: Never    Frequency: Never  . Drug use: Never  . Sexual activity: Not on file  Lifestyle  . Physical activity:    Days per week: Not on file    Minutes per session: Not on file  . Stress: Not on file  Relationships  . Social connections:    Talks on phone: Not on file    Gets together: Not on file     Attends religious service: Not on file    Active member of club or organization: Not on file    Attends meetings of clubs or organizations: Not on file    Relationship status: Not on file  . Intimate partner violence:    Fear of current or ex partner: Not on file    Emotionally abused: Not on file    Physically abused: Not on file    Forced sexual activity: Not on file  Other Topics Concern  . Not on file  Social History Narrative   Former Editor, commissioning.  Lives alone, but has 5 children in the area who are grown and supportive.  +Financial difficulties - trouble paying bills.  She has been married to her current husband for 3 years.    Medications:  Current Outpatient Medications on File Prior to Visit  Medication Sig Dispense Refill  . atorvastatin (LIPITOR) 80 MG tablet Take 1 tablet (80 mg total) by mouth daily. 90 tablet 0  . lisinopril (PRINIVIL,ZESTRIL) 40 MG tablet TAKE 1 TABLET BY MOUTH EVERY DAY 30 tablet 0  . warfarin (COUMADIN) 2 MG tablet Take 3 mg (1.5 tabs) Mon and Thurs Take 2 mg (1 tab) Sun, Tues, Wed, Fri, Sat 60 tablet 6   No current facility-administered medications on file prior to visit.     Allergies:  No Known Allergies  Physical Exam General: petite frail [place] lady, seated, in no evident distress Head: head normocephalic and atraumatic.   Neck: supple with no carotid or supraclavicular bruits Cardiovascular: regular rate and rhythm, no murmurs Musculoskeletal: no deformity Skin:  no rash/petichiae Vascular:  Normal pulses all extremities  Neurologic Exam Mental Status: Exam difficult due to language barrier and interpreter is necessary.  Appears to be disoriented and slightly confused but follows simple commands well.  Awake and fully alert. Oriented to place and time. Recent and remote memory intact. Attention span, concentration and fund of knowledge appropriate. Mood and affect appropriate.  Diminished recall 1/3.  Able to name 6 animals  with 4 legs.Clock drawing 2/4 Cranial Nerves: Fundoscopic exam reveals sharp disc margins. Pupils equal, briskly reactive to light. Extraocular movements full without nystagmus. Visual fields full to confrontation. Hearing intact. Facial sensation intact. Face, tongue, palate moves normally and symmetrically.  Motor: Normal bulk and tone. Normal strength in all tested extremity muscles.  Except diminished fine finger movements on the left.  Orbits right over left upper extremity. Sensory.: intact to touch , pinprick , position and vibratory sensation.  Coordination: Rapid alternating movements normal in all extremities. Finger-to-nose and heel-to-shin performed accurately bilaterally. Gait and Station: Arises from chair without difficulty. Stance is normal. Gait demonstrates normal stride length and balance .  Unable to heel, toe and tandem walk   Reflexes: 1+ and symmetric. Toes downgoing.   NIHSS 2 Modified Rankin  4   ASSESSMENT: 73 year old lady with right MCA branch infarct in September 2019 secondary to thromboembolism from right carotid thrombus with significant residual vascular cognitive impairment.  Vascular risk factors of hypertension, carotid thrombus and hyperlipidemia.Persistent post stroke cognitive impairment likely mild vascular dementia     PLAN: I had a long discussion with the patient and her husband regarding her post stroke cognitive impairment/mild vascular dementia which does not appear to be improving and work-up for reversible causes has been negative.  I recommended treatment trial of Namenda starting 5 mg twice daily for a month to be increase if tolerated to 10 mg twice daily.  I have discussed possible side effects with the patient and husband and asked him to call me if needed.  Continue aspirin for stroke prevention and strict control of hypertension with blood pressure goal below 130/90 and lipids with LDL cholesterol goal below 70 mg percent.  She was also advised  to eat a healthy diet with lots of fruits vegetables, cereals and to be active.  She was also encouraged to increase participation in cognitively challenging activities like solving crossword puzzles, playing sudoku and bridge.  We also discussed memory compensation strategies.  Greater than 50% time during this 25-minute   visit was spent on counseling and coordination of care about her stroke, carotid thrombus, cognitive impairment and vascular dementia and answering questions . Delia Heady, MD  [facility] [address] [place], [place]  [address]  Phone [phone] Fax [phone] Note: This document was prepared with digital dictation and possible smart phrase technology. Any transcriptional errors that result from this process are unintentional.

## 2018-01-31 ENCOUNTER — Other Ambulatory Visit: Payer: Self-pay | Admitting: Family Medicine

## 2018-01-31 DIAGNOSIS — G4700 Insomnia, unspecified: Secondary | ICD-10-CM | POA: Diagnosis not present

## 2018-02-06 ENCOUNTER — Other Ambulatory Visit: Payer: Self-pay | Admitting: Family Medicine

## 2018-02-20 ENCOUNTER — Other Ambulatory Visit: Payer: Self-pay | Admitting: Family Medicine

## 2018-02-21 MED ORDER — LISINOPRIL 40 MG PO TABS: 40.0000 mg | ORAL_TABLET | Freq: Every day | ORAL | 1 refills | Status: DC

## 2018-02-21 NOTE — Addendum Note (Signed)
Addended by: Nigel Mormon on: 02/21/2018 12:04 PM   Modules accepted: Orders

## 2018-02-23 NOTE — Progress Notes (Deleted)
Cardiology Office Note   Date:  02/23/2018   ID:  Sherry Fitzgerald, Sherry Fitzgerald 02/10/1945, MRN [id number]  PCP:  No primary care provider on file.  Cardiologist:   No primary care provider on file. Referring:  ***  No chief complaint on file.     History of Present Illness: Sherry Fitzgerald is a 73 y.o. female who presents for ***    She was in the hospital in Sept with CVA.  I reviewed these records for this visit.   She had a right frontal lobe infarct related to an occluded branch of the right frontal lobe branch of the MCA.  .  The EF ws NL on echo in Sept.  She was thought to have embolism from a right common carotid thrombus and was discharged on warfarin.   She was subsequently changed to ASA.   She has persistent cognitive deficits with probable post stroke dementia.      ***   Past Medical History:  Diagnosis Date  . Cerebrovascular accident (CVA) (HCC)   . Hypertension    no medications for years    Past Surgical History:  Procedure Laterality Date  . CHOLECYSTECTOMY  2014     Current Outpatient Medications  Medication Sig Dispense Refill  . atorvastatin (LIPITOR) 80 MG tablet Take 1 tablet (80 mg total) by mouth daily. 90 tablet 0  . lisinopril (PRINIVIL,ZESTRIL) 40 MG tablet Take 1 tablet (40 mg total) by mouth daily. 90 tablet 1  . warfarin (COUMADIN) 2 MG tablet Take 3 mg (1.5 tabs) Mon and Thurs Take 2 mg (1 tab) Sun, Tues, Wed, Fri, Sat 60 tablet 6   Current Facility-Administered Medications  Medication Dose Route Frequency Provider Last Rate Last Dose  . memantine (NAMENDA) tablet 5 mg  5 mg Oral BID Micki Riley, MD        Allergies:   Patient has no known allergies.    Social History:  The patient  reports that she has never smoked. She has never used smokeless tobacco. She reports that she does not drink alcohol or use drugs.   Family History:  The patient's ***family history includes CVA in her father and mother; Heart disease (age of onset: 51) in  her father.    ROS:  Please see the history of present illness.   Otherwise, review of systems are positive for {NONE DEFAULTED:18576::"none"}.   All other systems are reviewed and negative.    PHYSICAL EXAM: VS:  There were no vitals taken for this visit. , BMI There is no height or weight on file to calculate BMI. GENERAL:  Well appearing HEENT:  Pupils equal round and reactive, fundi not visualized, oral mucosa unremarkable NECK:  No jugular venous distention, waveform within normal limits, carotid upstroke brisk and symmetric, no bruits, no thyromegaly LYMPHATICS:  No cervical, inguinal adenopathy LUNGS:  Clear to auscultation bilaterally BACK:  No CVA tenderness CHEST:  Unremarkable HEART:  PMI not displaced or sustained,S1 and S2 within normal limits, no S3, no S4, no clicks, no rubs, *** murmurs ABD:  Flat, positive bowel sounds normal in frequency in pitch, no bruits, no rebound, no guarding, no midline pulsatile mass, no hepatomegaly, no splenomegaly EXT:  2 plus pulses throughout, no edema, no cyanosis no clubbing SKIN:  No rashes no nodules NEURO:  Cranial nerves II through XII grossly intact, motor grossly intact throughout PSYCH:  Cognitively intact, oriented to person place and time    EKG:  EKG {ACTION; IS/IS WUX:32440102} ordered today. The ekg ordered today demonstrates ***   Recent Labs: 09/15/2017: ALT 19 09/17/2017: BUN 10; Creatinine, Ser 0.75; Potassium 4.2; Sodium 140 09/18/2017: Hemoglobin 13.4; Platelets 219 11/10/2017: TSH 1.950    Lipid Panel    Component Value Date/Time   CHOL 266 (H) 09/16/2017 0517   TRIG 178 (H) 09/16/2017 0517   HDL 50 09/16/2017 0517   CHOLHDL 5.3 09/16/2017 0517   VLDL 36 09/16/2017 0517   LDLCALC 180 (H) 09/16/2017 0517   LDLDIRECT 191 (H) 07/30/2014 1659      Wt Readings from Last 3 Encounters:  01/18/18 111 lb 9.6 oz (50.6 kg)  11/10/17 116 lb (52.6 kg)  10/26/17 118 lb 3.2 oz (53.6 kg)      Other studies  Reviewed: Additional studies/ records that were reviewed today include: ***. Review of the above records demonstrates:  Please see elsewhere in the note.  ***   ASSESSMENT AND PLAN:  CVA:  ***  HTN:  ***  DYSLIPIDEMIA:  ***    Current medicines are reviewed at length with the patient today.  The patient {ACTIONS; HAS/DOES NOT HAVE:19233} concerns regarding medicines.  The following changes have been made:  {PLAN; NO CHANGE:13088:s}  Labs/ tests ordered today include: *** No orders of the defined types were placed in this encounter.    Disposition:   FU with ***    Signed, Rollene Rotunda, MD  02/23/2018 8:08 PM    [facility]

## 2018-02-25 ENCOUNTER — Ambulatory Visit: Payer: Medicare Other | Admitting: Cardiology

## 2018-03-08 ENCOUNTER — Ambulatory Visit (INDEPENDENT_AMBULATORY_CARE_PROVIDER_SITE_OTHER): Payer: Medicare Other | Admitting: Neurology

## 2018-03-08 ENCOUNTER — Other Ambulatory Visit: Payer: Self-pay | Admitting: *Deleted

## 2018-03-08 ENCOUNTER — Other Ambulatory Visit: Payer: Self-pay

## 2018-03-08 ENCOUNTER — Encounter: Payer: Self-pay | Admitting: Neurology

## 2018-03-08 VITALS — BP 136/72 | HR 59 | Resp 16 | Ht <= 58 in | Wt 115.5 lb

## 2018-03-08 DIAGNOSIS — Z7901 Long term (current) use of anticoagulants: Secondary | ICD-10-CM

## 2018-03-08 DIAGNOSIS — I63411 Cerebral infarction due to embolism of right middle cerebral artery: Secondary | ICD-10-CM | POA: Diagnosis not present

## 2018-03-08 DIAGNOSIS — M5416 Radiculopathy, lumbar region: Secondary | ICD-10-CM | POA: Diagnosis not present

## 2018-03-08 DIAGNOSIS — M5412 Radiculopathy, cervical region: Secondary | ICD-10-CM

## 2018-03-08 DIAGNOSIS — R413 Other amnesia: Secondary | ICD-10-CM

## 2018-03-08 DIAGNOSIS — R0902 Hypoxemia: Secondary | ICD-10-CM

## 2018-03-08 MED ORDER — MEMANTINE HCL 5 MG PO TABS: 5.0000 mg | ORAL_TABLET | Freq: Two times a day (BID) | ORAL | 0 refills | Status: DC

## 2018-03-08 NOTE — Progress Notes (Signed)
Pt. did not receive Namenda rx. as ordered in her visit with Dr. Pearlean Brownie on 01/18/18. Rx. escribed to [facility] today per husband's request.  Husband instructed to call our office back if pt. is tolerating Namenda 5mg  bid, so that Dr. Pearlean Brownie can increase the dose/fim

## 2018-03-08 NOTE — Progress Notes (Addendum)
SLEEP MEDICINE CLINIC   Provider:  Melvyn Novas, MD    Primary Care Physician:  Jarome Matin, MD   Referring Provider: Jarome Matin, MD   Chief Complaint  Patient presents with  . Snoring    Rm. 11. Here with husband for eval of snoring. No apneic episoces noted. ONO done with desats to 83% noted. Hx. of CVA 09/15/17, followed by Dr. Lysle Dingwall    HPI:  Sherry Fitzgerald is a 73 y.o. female patient of Dr Marlis Edelson and  Dr. Silvano Rusk for possible OSA.   Chief complaint according to patient : husband speaks for this patient, who suffered a stroke in late 2019, woke up dizzy and lightheaded, her left face drooped- and has since  memory difficulties. Her speech is less fluent, she is no longer joking and happy.  during her hospitalization she was known to snore softly and having hypoxemia, not frank apnea. She has been more sleepy in daytime.  Her husband stated that she is no longer alert to watch a movie all the way through, taking more naps.  Sleep habits are as follows: light breakfast, big lunch, light meal at dinnertime, which  for the couple is between 5 and 6 PM, she may watch TV through the afternoon.  Bedtime between 8-9 PM for Sherry Fitzgerald alone. Her husband joins at 12 midnight. Since the stroke she tends to wake up at 1 in the morning, and she starts to prepare breakfast and aware that it is not yet time. If her husband makes her aware she will go back to sleep and rises around 6 AM for breakfast the usual time for the couple.  She seems to average 8 hours of sleep or more - The  Patient states she doesn't sleep in daytime, her husband  disputed this- she falls asleep when ever at rest.    Sleep medical history: hyperlipidemia, HTN found after stroke admission.  Allergic rhinitis.   The patient is still waiting to have a cardiology appointment which has been scheduled for mid March with Dr. Rennis Golden.  She has not had a heart cardiac monitor at this time yet a work-up for  possible cardiac embolism.  She was asked not to perform strenuous activity and she was released from hospital yet she is not sure what that means for her exercise regimen etc.  Cc Dr Pearlean Brownie, MD   Family sleep history:  Not fully known.    Social history:  Friends of Rob O'Hanlon, the hood[place]. She came to the [place] in 1985. Husband is a Cytogeneticist. Non smoker, non ETOH, caffeine- one a week.    Review of Systems: Out of a complete 14 system review, the patient complains of only the following symptoms, and all other reviewed systems are negative.  Poverty of speech- mild left facial droop.    Epworth score 9 , Fatigue severity score 24   , depression score N/A    Social History   Socioeconomic History  . Marital status: Married    Spouse name: Not on file  . Number of children: Not on file  . Years of education: Not on file  . Highest education level: Not on file  Occupational History  . Occupation: retired  Engineer, production  . Financial resource strain: Not on file  . Food insecurity:    Worry: Not on file    Inability: Not on file  . Transportation needs:    Medical: Not on file    Non-medical: Not on  file  Tobacco Use  . Smoking status: Never Smoker  . Smokeless tobacco: Never Used  Substance and Sexual Activity  . Alcohol use: Never    Frequency: Never  . Drug use: Never  . Sexual activity: Not on file  Lifestyle  . Physical activity:    Days per week: Not on file    Minutes per session: Not on file  . Stress: Not on file  Relationships  . Social connections:    Talks on phone: Not on file    Gets together: Not on file    Attends religious service: Not on file    Active member of club or organization: Not on file    Attends meetings of clubs or organizations: Not on file    Relationship status: Not on file  . Intimate partner violence:    Fear of current or ex partner: Not on file    Emotionally abused: Not on file    Physically abused: Not on file    Forced sexual  activity: Not on file  Other Topics Concern  . Not on file  Social History Narrative   Former Editor, commissioning.  Lives alone, but has 5 children in the area who are grown and supportive.  +Financial difficulties - trouble paying bills.  She has been married to her current husband for 3 years.    Family History  Problem Relation Age of Onset  . CVA Mother   . Heart disease Father 27  . CVA Father     Past Medical History:  Diagnosis Date  . Cerebrovascular accident (CVA) (HCC)   . Hypertension    no medications for years    Past Surgical History:  Procedure Laterality Date  . CHOLECYSTECTOMY  2014    Current Outpatient Medications  Medication Sig Dispense Refill  . atorvastatin (LIPITOR) 80 MG tablet Take 1 tablet (80 mg total) by mouth daily. 90 tablet 0  . memantine (NAMENDA) 5 MG tablet Take 1 tablet (5 mg total) by mouth 2 (two) times daily. 60 tablet 0   No current facility-administered medications for this visit.     Allergies as of 03/08/2018  . (No Known Allergies)    Vitals: BP 136/72   Pulse (!) 59   Resp 16   Ht  (1.422 m)   Wt 115 lb 8 oz (52.4 kg)   BMI 25.89 kg/m  Last Weight:  Wt Readings from Last 1 Encounters:  03/08/18 115 lb 8 oz (52.4 kg)   ZOX:WRUE mass index is 25.89 kg/m.     Last Height:   Ht Readings from Last 1 Encounters:  03/08/18  (1.422 m)    Sherry Fitzgerald underwent a pulse oximetry more or less as a screening device at home this was done on 27 January 2018 and documented frequent but brief desaturations.  Hypoxemia would not be considered severe enough on the Medicare criteria.  Her pulse rate varied between 110 bpm and 47 bpm so she does have tachybradycardia.  Oxygen nadir was 83% SPO2, but awake oxygen saturation was 97%.  The pulse oximetry did not justify oxygen to be prescribed.  Physical exam:  General: The patient is awake, alert and appears not in acute distress. The patient is well groomed. Head:  Normocephalic, atraumatic. Neck is supple. Mallampati : 2   neck circumference:13" . Nasal airflow patent ,  Retrognathia is seen. Small lower jaw.   Cardiovascular:  Regular rate and rhythm, without  murmurs or carotid  bruit, and without distended neck veins. Respiratory: Lungs are clear to auscultation. Skin:  Without evidence of edema, or rash Trunk: BMI is 25 . The patient's posture is erect. Neurologic exam : The patient is awake and alert, oriented to place and time.   Memory subjective described as intact.  ( husband stated this is not true).  Attention span & concentration ability appears normal.  Speech is non- fluent,aphasia.  Mood and affect are aloof   Cranial nerves: Pupils are equal and briskly reactive to light. Funduscopic exam without evidence of pallor or edema. Extraocular movements  in vertical and horizontal planes intact and without nystagmus.  Visual fields by finger perimetry are intact. Hearing to finger rub intact.   Facial sensation intact to fine touch.  Facial motor strength is mildly affected, left lower facial droop. symmetric  tongue and uvula movements . Shoulder shrug was asymmetrical- she has a droop in the left shoulder from years before.    Motor exam: symmetric muscle bulk  in all extremities. The right arm has a pronator drift.  Elevated tone in left biceps.   Sensory:  Fine touch, pinprick and vibration were tested in all extremities. Proprioception tested in the upper extremities was normal.  Coordination: Rapid alternating movements in the fingers/hands was normal. Finger-to-nose maneuver normal without evidence of ataxia, dysmetria or tremor.  Gait and station: Patient walks without assistive device Deep tendon reflexes: in the  upper and lower extremities are symmetric and intact.   Assessment:  After physical and neurologic examination, review of laboratory studies,  Personal review of imaging studies, reports of other /same  Imaging studies,  results of polysomnography and / or neurophysiology testing and pre-existing records as far as provided in visit., my assessment :    1)  I think Sherry Fitzgerald needs to under go sleep testing, but she may feel most comfortable in her own home - with a HST.    The patient was advised of the nature of the diagnosed disorder , the treatment options and the  risks for general health and wellness arising from not treating the condition.   I spent more than 40 minutes of face to face time with the patient.  Greater than 50% of time was spent in counseling and coordination of care. We have discussed the diagnosis and differential and I answered the patient's questions.    Plan:  Treatment plan and additional workup :  HST   Melvyn Novas, MD 03/08/2018, 9:18 AM  Certified in Neurology by ABPN Certified in Sleep Medicine by [facility] [address] [place], [place] [address]

## 2018-03-09 ENCOUNTER — Other Ambulatory Visit: Payer: Self-pay | Admitting: Neurology

## 2018-03-09 ENCOUNTER — Telehealth: Payer: Self-pay

## 2018-03-09 DIAGNOSIS — R413 Other amnesia: Secondary | ICD-10-CM

## 2018-03-09 DIAGNOSIS — G3184 Mild cognitive impairment, so stated: Secondary | ICD-10-CM

## 2018-03-09 DIAGNOSIS — I63411 Cerebral infarction due to embolism of right middle cerebral artery: Secondary | ICD-10-CM

## 2018-03-09 DIAGNOSIS — F0150 Vascular dementia without behavioral disturbance: Secondary | ICD-10-CM

## 2018-03-09 DIAGNOSIS — R0902 Hypoxemia: Secondary | ICD-10-CM

## 2018-03-09 NOTE — Telephone Encounter (Signed)
MD notes state she will order a Split night study. No order found for Split.

## 2018-03-09 NOTE — Telephone Encounter (Signed)
Order placed for split night.

## 2018-03-21 ENCOUNTER — Ambulatory Visit (INDEPENDENT_AMBULATORY_CARE_PROVIDER_SITE_OTHER): Payer: Medicare Other | Admitting: Internal Medicine

## 2018-03-21 ENCOUNTER — Other Ambulatory Visit: Payer: Self-pay | Admitting: Family Medicine

## 2018-03-21 ENCOUNTER — Other Ambulatory Visit: Payer: Self-pay

## 2018-03-21 ENCOUNTER — Encounter: Payer: Self-pay | Admitting: Internal Medicine

## 2018-03-21 VITALS — BP 164/76 | HR 60 | Ht 59.0 in | Wt 118.4 lb

## 2018-03-21 DIAGNOSIS — E782 Mixed hyperlipidemia: Secondary | ICD-10-CM

## 2018-03-21 DIAGNOSIS — I10 Essential (primary) hypertension: Secondary | ICD-10-CM

## 2018-03-21 DIAGNOSIS — I6300 Cerebral infarction due to thrombosis of unspecified precerebral artery: Secondary | ICD-10-CM | POA: Diagnosis not present

## 2018-03-21 NOTE — Progress Notes (Signed)
OFFICE CONSULT NOTE  Chief Complaint:  Stroke  Primary Care Physician: Jarome Matin, MD  HPI:  Sherry Fitzgerald is a 73 y.o. female who is being seen today for the evaluation of stroke at the request of Myrene Buddy, MD. 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  She unfortunately suffered a stroke in the fall, the etiology of which was not clear.  She does have a longstanding history of hypertension.  In addition she was noted to have marked dyslipidemia with LDL of 180.  She was started on high intensity atorvastatin has not had repeat lipids.  This is managed by her PCP and neurologist.  Further work-up included an echo in the hospital which showed normal systolic function with moderate left atrial enlargement.  There is no evidence of arrhythmia in the hospital.  After follow-up her husband was concerned that cardiovascular work-up was not performed and therefore she was referred for evaluation of possible arrhythmic cause of her stroke.  Currently she is asymptomatic, denying any chest pain or worsening shortness of breath, although she does not have significant deficits, her husband notes that she has had significant memory problems including short-term memory and some changes in personality.  Recently she was started on memantine with some improvement in her symptoms.  PMHx:  Past Medical History:  Diagnosis Date  . Cerebrovascular accident (CVA) (HCC)   . Hypertension    no medications for years    Past Surgical History:  Procedure Laterality Date  . CHOLECYSTECTOMY  2014    FAMHx:  Family History  Problem Relation Age of Onset  . CVA Mother   . Heart disease Father 32  . CVA Father     SOCHx:   reports that she has never smoked. She has never used smokeless tobacco. She reports that she does not drink alcohol or use  drugs.  ALLERGIES:  No Known Allergies  ROS: Pertinent items noted in HPI and remainder of comprehensive ROS otherwise negative.  HOME MEDS: Current Outpatient Medications on File Prior to Visit  Medication Sig Dispense Refill  . atorvastatin (LIPITOR) 80 MG tablet Take 1 tablet (80 mg total) by mouth daily. 90 tablet 0  . losartan (COZAAR) 50 MG tablet Take 50 mg by mouth daily.    . memantine (NAMENDA) 5 MG tablet Take 1 tablet (5 mg total) by mouth 2 (two) times daily. 60 tablet 0   No current facility-administered medications on file prior to visit.     LABS/IMAGING: No results found for this or any previous visit (from the past 48 hour(s)). No results found.  LIPID PANEL:    Component Value Date/Time   CHOL 266 (H) 09/16/2017 0517   TRIG 178 (H) 09/16/2017 0517   HDL 50 09/16/2017 0517   CHOLHDL 5.3 09/16/2017 0517   VLDL 36 09/16/2017 0517   LDLCALC 180 (H) 09/16/2017 0517   LDLDIRECT 191 (H) 07/30/2014 1659    WEIGHTS: Wt Readings from Last 3 Encounters:  03/21/18 118 lb 6.4 oz (53.7 kg)  03/08/18 115 lb 8 oz (52.4 kg)  01/18/18 111 lb 9.6 oz (50.6 kg)    VITALS: BP (!) 164/76   Pulse 60   Ht 4\' 11"  (1.499 m)   Wt 118 lb 6.4 oz (53.7 kg)   BMI 23.91 kg/m   EXAM: General appearance:  alert and no distress Neck: no carotid bruit, no JVD and thyroid not enlarged, symmetric, no tenderness/mass/nodules Lungs: clear to auscultation bilaterally Heart: regular rate and rhythm, S1, S2 normal, no murmur, click, rub or gallop Abdomen: soft, non-tender; bowel sounds normal; no masses,  no organomegaly Extremities: extremities normal, atraumatic, no cyanosis or edema Pulses: 2+ and symmetric Skin: Skin color, texture, turgor normal. No rashes or lesions Neurologic: Grossly normal Psych: Pleasant  EKG: Deferred  ASSESSMENT: 1. Stroke 2. Hypertension 3. Marked dyslipidemia  PLAN: 1.   K [name] had recent stroke and has a longstanding history of hypertension  with moderate left atrial enlargement.  She may be at risk for atrial fibrillation and I recommend a 30-day monitor to see if we can pick up on that.  She also had marked dyslipidemia, but is now on high potency statin therapy.  She will need a repeat lipid profile and further adjustment of her lipid therapies to goal LDL less than 70.  Plan follow-up with me in about 6 weeks to review her monitor.  Thanks again for the kind referral.  Chrystie Nose, MD, [facility]  [place]  [facility]  Medical Director of the Advanced Lipid Disorders &  Cardiovascular Risk Reduction Clinic Diplomate of the American Board of Clinical Lipidology Attending Cardiologist  Direct Dial: [phone]  Fax: [phone]  Website:  [email or web address]  03/21/2018, 3:13 PM

## 2018-03-21 NOTE — Patient Instructions (Addendum)
Medication Instructions:  NOT NEEDED If you need a refill on your cardiac medications before your next appointment, please call your pharmacy.   Lab work: Not needed If you have labs (blood work) drawn today and your tests are completely normal, you will receive your results only by: Marland Kitchen MyChart Message (if you have MyChart) OR . A paper copy in the mail If you have any lab test that is abnormal or we need to change your treatment, we will call you to review the results.  Testing/Procedures: Schedule at [address] Your physician has recommended that you wear an event monitor 30 days. Event monitors are medical devices that record the heart's electrical activity. Doctors most often [place] these monitors to diagnose arrhythmias. Arrhythmias are problems with the speed or rhythm of the heartbeat. The monitor is a small, portable device. You can wear one while you do your normal daily activities. This is usually used to diagnose what is causing palpitations/syncope (passing out).    Follow-Up: At [facility], you and your health needs are our priority.  As part of our continuing mission to provide you with exceptional heart care, we have created designated Provider Care Teams.  These Care Teams include your primary Cardiologist (physician) and Advanced Practice Providers (APPs -  Physician Assistants and Nurse Practitioners) who all work together to provide you with the care you need, when you need it. . Your physician recommends that you schedule a follow-up appointment in 6 to 8 weeks with Dr Rennis Golden--- F/U EVENT MONITOR .   Any Other Special Instructions Will Be Listed Below (If Applicable).

## 2018-03-31 ENCOUNTER — Telehealth: Payer: Self-pay | Admitting: Neurology

## 2018-03-31 ENCOUNTER — Other Ambulatory Visit: Payer: Self-pay

## 2018-03-31 MED ORDER — MEMANTINE HCL 5 MG PO TABS: 5.0000 mg | ORAL_TABLET | Freq: Two times a day (BID) | ORAL | 0 refills | Status: DC

## 2018-03-31 MED ORDER — MEMANTINE HCL 5 MG PO TABS: 5.0000 mg | ORAL_TABLET | Freq: Two times a day (BID) | ORAL | 6 refills | Status: DC

## 2018-03-31 NOTE — Telephone Encounter (Signed)
Pts husband called stating that the pt only has a weeks worth of memantine (NAMENDA) 5 MG tablet left and is wondering if she is to stay on this medication and if so the refill is to [facility] to [facility] [address]. Please advise. Husband states that the medication is working really well.

## 2018-03-31 NOTE — Telephone Encounter (Signed)
Refill sent to pharmacy via epic.

## 2018-04-12 ENCOUNTER — Telehealth: Payer: Self-pay

## 2018-04-12 NOTE — Telephone Encounter (Signed)
If husband calls back r/s for July with Shanda Bumps NP only thanks.   Left vm on husband phone about r/s pt for July 2020. Pt is a memory visit and unable to do testing via video and telephone visit. Pt needs to be r/s in the month for JUly 2020 explain reason is COVID 19. Appt will be cancel.

## 2018-04-19 ENCOUNTER — Ambulatory Visit: Payer: Medicare Other | Admitting: Adult Health

## 2018-04-20 ENCOUNTER — Telehealth: Payer: Self-pay | Admitting: Radiology

## 2018-04-20 ENCOUNTER — Other Ambulatory Visit: Payer: Self-pay | Admitting: Internal Medicine

## 2018-04-20 DIAGNOSIS — I4891 Unspecified atrial fibrillation: Secondary | ICD-10-CM

## 2018-04-20 DIAGNOSIS — I6300 Cerebral infarction due to thrombosis of unspecified precerebral artery: Secondary | ICD-10-CM

## 2018-04-20 NOTE — Telephone Encounter (Signed)
Enrolled patient with [employer] for a 30 day Event Monitor to be mailed due to Covid-19. Patients husband was given instructions and knows to expect monitor to arrive in 3-4 days.

## 2018-04-24 ENCOUNTER — Other Ambulatory Visit: Payer: Self-pay | Admitting: Neurology

## 2018-04-25 ENCOUNTER — Telehealth: Payer: Self-pay | Admitting: Internal Medicine

## 2018-04-25 NOTE — Telephone Encounter (Signed)
New Message:    Pt still have not received her monitor.

## 2018-04-28 ENCOUNTER — Telehealth: Payer: Self-pay | Admitting: Neurology

## 2018-04-28 NOTE — Telephone Encounter (Signed)
We have attempted to call the patient 2 times to schedule sleep study. Patient has been unavailable at the phone numbers we have on file and has not returned our calls. At this point we will send a letter asking pt to please contact the sleep lab to schedule their sleep study. If patient calls back we will schedule them for their sleep study. ° °

## 2018-05-05 ENCOUNTER — Other Ambulatory Visit: Payer: Self-pay | Admitting: Neurology

## 2018-05-11 ENCOUNTER — Other Ambulatory Visit: Payer: Self-pay | Admitting: *Deleted

## 2018-05-11 MED ORDER — MEMANTINE HCL 5 MG PO TABS: 5.0000 mg | ORAL_TABLET | Freq: Two times a day (BID) | ORAL | 0 refills | Status: DC

## 2018-05-11 MED ORDER — MEMANTINE HCL 10 MG PO TABS: 10.0000 mg | ORAL_TABLET | Freq: Two times a day (BID) | ORAL | 5 refills | Status: DC

## 2018-05-11 NOTE — Progress Notes (Signed)
I increased dose per Dr. Marlis Edelson note to memantine 10mg  po bid if tolerated 5mg  po bid.

## 2018-05-11 NOTE — Telephone Encounter (Signed)
I refilled memantine 5mg  po bid #60 (x 1).  Awaiting message from husband about imcreasing dose to 10mg  po bid.

## 2018-05-24 DIAGNOSIS — I10 Essential (primary) hypertension: Secondary | ICD-10-CM | POA: Diagnosis not present

## 2018-05-24 DIAGNOSIS — Z8679 Personal history of other diseases of the circulatory system: Secondary | ICD-10-CM | POA: Diagnosis not present

## 2018-05-24 DIAGNOSIS — G4700 Insomnia, unspecified: Secondary | ICD-10-CM | POA: Diagnosis not present

## 2018-05-24 DIAGNOSIS — E785 Hyperlipidemia, unspecified: Secondary | ICD-10-CM | POA: Diagnosis not present

## 2018-05-31 ENCOUNTER — Ambulatory Visit: Payer: Medicare Other | Admitting: Internal Medicine

## 2018-05-31 ENCOUNTER — Telehealth: Payer: Self-pay | Admitting: Internal Medicine

## 2018-05-31 NOTE — Telephone Encounter (Addendum)
Spoke with husband and rescheduled 05/31/18 appointment to 06/03/18 @ 0900 - video, virtual, [email or web address] as patient/spouse prefer computer. Info below reviewed with spouse - consent obtained for video virtual visit  YOUR CARDIOLOGY TEAM HAS ARRANGED FOR AN E-VISIT FOR YOUR APPOINTMENT - PLEASE REVIEW IMPORTANT INFORMATION BELOW SEVERAL DAYS PRIOR TO YOUR APPOINTMENT  Due to the recent COVID-19 pandemic, we are transitioning in-person office visits to tele-medicine visits in an effort to decrease unnecessary exposure to our patients, their families, and staff. These visits are billed to your insurance just like a normal visit is. We also encourage you to sign up for MyChart if you have not already done so. You will need a smartphone if possible. For patients that do not have this, we can still complete the visit using a regular telephone but do prefer a smartphone to enable video when possible. You may have a family member that lives with you that can help. If possible, we also ask that you have a blood pressure cuff and scale at home to measure your blood pressure, heart rate and weight prior to your scheduled appointment. Patients with clinical needs that need an in-person evaluation and testing will still be able to come to the office if absolutely necessary. If you have any questions, feel free to call our office.     YOUR PROVIDER WILL BE USING THE FOLLOWING PLATFORM TO COMPLETE YOUR VISIT: Doxy.Me  . IF USING DOXIMITY or DOXY.ME - The staff will give you instructions on receiving your link to join the meeting the day of your visit.      2-3 DAYS BEFORE YOUR APPOINTMENT  You will receive a telephone call from one of our HeartCare team members - your caller ID may say "Unknown caller." If this is a video visit, we will walk you through how to get the video launched on your phone. We will remind you check your blood pressure, heart rate and weight prior to your scheduled appointment. If you have an  Apple Watch or Kardia, please upload any pertinent ECG strips the day before or morning of your appointment to MyChart. Our staff will also make sure you have reviewed the consent and agree to move forward with your scheduled tele-health visit.     THE DAY OF YOUR APPOINTMENT  Approximately 15 minutes prior to your scheduled appointment, you will receive a telephone call from one of HeartCare team - your caller ID may say "Unknown caller."  Our staff will confirm medications, vital signs for the day and any symptoms you may be experiencing. Please have this information available prior to the time of visit start. It may also be helpful for you to have a pad of paper and pen handy for any instructions given during your visit. They will also walk you through joining the smartphone meeting if this is a video visit.    CONSENT FOR TELE-HEALTH VISIT - PLEASE REVIEW  I hereby voluntarily request, consent and authorize [facility] and its employed or contracted physicians, physician assistants, nurse practitioners or other licensed health care professionals (the Practitioner), to provide me with telemedicine health care services (the "Services") as deemed necessary by the treating Practitioner. I acknowledge and consent to receive the Services by the Practitioner via telemedicine. I understand that the telemedicine visit will involve communicating with the Practitioner through live audiovisual communication technology and the disclosure of certain medical information by electronic transmission. I acknowledge that I have been given the opportunity to request an in-person assessment or  other available alternative prior to the telemedicine visit and am voluntarily participating in the telemedicine visit.  I understand that I have the right to withhold or withdraw my consent to the use of telemedicine in the course of my care at any time, without affecting my right to future care or treatment, and that the  Practitioner or I may terminate the telemedicine visit at any time. I understand that I have the right to inspect all information obtained and/or recorded in the course of the telemedicine visit and may receive copies of available information for a reasonable fee.  I understand that some of the potential risks of receiving the Services via telemedicine include:  Marland Kitchen Delay or interruption in medical evaluation due to technological equipment failure or disruption; . Information transmitted may not be sufficient (e.g. poor resolution of images) to allow for appropriate medical decision making by the Practitioner; and/or  . In rare instances, security protocols could fail, causing a breach of personal health information.  Furthermore, I acknowledge that it is my responsibility to provide information about my medical history, conditions and care that is complete and accurate to the best of my ability. I acknowledge that Practitioner's advice, recommendations, and/or decision may be based on factors not within their control, such as incomplete or inaccurate data provided by me or distortions of diagnostic images or specimens that may result from electronic transmissions. I understand that the practice of medicine is not an exact science and that Practitioner makes no warranties or guarantees regarding treatment outcomes. I acknowledge that I will receive a copy of this consent concurrently upon execution via email to the email address I last provided but may also request a printed copy by calling the office of [facility].    I understand that my insurance will be billed for this visit.   I have read or had this consent read to me. . I understand the contents of this consent, which adequately explains the benefits and risks of the Services being provided via telemedicine.  . I have been provided ample opportunity to ask questions regarding this consent and the Services and have had my questions answered to my  satisfaction. . I give my informed consent for the services to be provided through the use of telemedicine in my medical care  By participating in this telemedicine visit I agree to the above.

## 2018-05-31 NOTE — Telephone Encounter (Signed)
LMTCB to discuss r/s 5/26 appointment to 5/28 (virtual) with Dr. Rennis Golden - this is his reader day but adding this patient on was OK'ed by MD

## 2018-05-31 NOTE — Telephone Encounter (Signed)
Call attempt made to change patient from OV to virtual. There was no answer when called and VM was for "Sherry Fitzgerald" which is not listed under emergency contact for patient.

## 2018-05-31 NOTE — Telephone Encounter (Signed)
Patient's husband Sherry Fitzgerald) returned call. They are amendable to virtual visit, however cannot complete at scheduled time of 830 as they are driving. He asked that I call back in about 1 hour to r/s.

## 2018-06-01 ENCOUNTER — Telehealth: Payer: Self-pay | Admitting: Internal Medicine

## 2018-06-01 NOTE — Telephone Encounter (Signed)
smartphone/ consent/ my chart/ pre reg completed °

## 2018-06-03 ENCOUNTER — Telehealth: Payer: Self-pay | Admitting: Internal Medicine

## 2018-06-03 ENCOUNTER — Telehealth (INDEPENDENT_AMBULATORY_CARE_PROVIDER_SITE_OTHER): Payer: Medicare Other | Admitting: Internal Medicine

## 2018-06-03 ENCOUNTER — Encounter: Payer: Self-pay | Admitting: Internal Medicine

## 2018-06-03 ENCOUNTER — Other Ambulatory Visit: Payer: Self-pay | Admitting: Neurology

## 2018-06-03 VITALS — BP 142/68 | HR 50 | Temp 97.7°F | Ht <= 58 in | Wt 112.0 lb

## 2018-06-03 DIAGNOSIS — I10 Essential (primary) hypertension: Secondary | ICD-10-CM | POA: Diagnosis not present

## 2018-06-03 DIAGNOSIS — I6389 Other cerebral infarction: Secondary | ICD-10-CM | POA: Diagnosis not present

## 2018-06-03 DIAGNOSIS — E782 Mixed hyperlipidemia: Secondary | ICD-10-CM

## 2018-06-03 MED ORDER — LOSARTAN POTASSIUM 100 MG PO TABS: 100.0000 mg | ORAL_TABLET | Freq: Every day | ORAL | 3 refills | Status: DC

## 2018-06-03 NOTE — Progress Notes (Signed)
Virtual Visit via Telephone Note   This visit type was conducted due to national recommendations for restrictions regarding the COVID-19 Pandemic (e.g. social distancing) in an effort to limit this patient's exposure and mitigate transmission in our community.  Due to her co-morbid illnesses, this patient is at least at moderate risk for complications without adequate follow up.  This format is felt to be most appropriate for this patient at this time.  The patient did not have access to video technology/had technical difficulties with video requiring transitioning to audio format only (telephone).  All issues noted in this document were discussed and addressed.  No physical exam could be performed with this format.  Please refer to the patient's chart for her  consent to telehealth for [facility].   Evaluation Performed:  Telephone follow-up  Date:  06/03/2018   ID:  Sherry Fitzgerald, DOB [birth year], MRN [id number]  Patient Location:  [address] [place] [address]  Provider location:   [address], [address] [place], [place] [address]  PCP:  Jarome MatinPaterson, Daniel, MD  Cardiologist:  Chrystie NoseKenneth C Meryl Hubers, MD Electrophysiologist:  None   Chief Complaint:  No complaints  History of Present Illness:    Sherry Fitzgerald is a 73 y.o. female who presents via audio/video conferencing for a telehealth visit today.  I had the pleasure today to follow-up with Sherry Fitzgerald.  I spoke mostly with her husband today.  She has been struggling with memory loss.  Her neurologist had recently increased her memantine.  She was supposed to have a home sleep study however the has not yet been performed.  He is concerned that she is somnolent and generally sleeps most of the day.  She has no complaints of A. fib, palpitations, chest pain or worsening shortness of breath.  She has had no further stroke or TIA events.  She is compliant with atorvastatin.  We have not had a repeat lipid profile though her  previous LDL was 180.  Additionally, she was arranged for a 30-day monitor.  There was some difficulty in getting the monitor and he received 3 different monitors at one point but sent to back.  She is close to the end of her 30-day monitoring.  However we do not have the results to review today.  The patient does not have symptoms concerning for COVID-19 infection (fever, chills, cough, or new SHORTNESS OF BREATH).    Prior CV studies:   The following studies were reviewed today:  Chart review Labwork  PMHx:  Past Medical History:  Diagnosis Date  . Cerebrovascular accident (CVA) (HCC)   . Hypertension    no medications for years    Past Surgical History:  Procedure Laterality Date  . CHOLECYSTECTOMY  2014    FAMHx:  Family History  Problem Relation Age of Onset  . CVA Mother   . Heart disease Father [id number]  . CVA Father     SOCHx:   reports that she has never smoked. She has never used smokeless tobacco. She reports that she does not drink alcohol or use drugs.  ALLERGIES:  No Known Allergies  MEDS:  Current Meds  Medication Sig  . aspirin 325 MG tablet Take 325 mg by mouth daily.  Marland Kitchen. atorvastatin (LIPITOR) 80 MG tablet Take 1 tablet (80 mg total) by mouth daily.  Marland Kitchen. losartan (COZAAR) 50 MG tablet Take 50 mg by mouth daily.  . memantine (NAMENDA) 10 MG tablet Take 1 tablet (10  mg total) by mouth 2 (two) times daily.     ROS: Pertinent items noted in HPI and remainder of comprehensive ROS otherwise negative.  Labs/Other Tests and Data Reviewed:    Recent Labs: 09/15/2017: ALT 19 09/17/2017: BUN 10; Creatinine, Ser 0.75; Potassium 4.2; Sodium 140 09/18/2017: Hemoglobin 13.4; Platelets 219 11/10/2017: TSH 1.950   Recent Lipid Panel Lab Results  Component Value Date/Time   CHOL 266 (H) 09/16/2017 05:17 AM   TRIG 178 (H) 09/16/2017 05:17 AM   HDL 50 09/16/2017 05:17 AM   CHOLHDL 5.3 09/16/2017 05:17 AM   LDLCALC 180 (H) 09/16/2017 05:17 AM   LDLDIRECT 191 (H)  07/30/2014 04:59 PM    Wt Readings from Last 3 Encounters:  06/03/18 112 lb (50.8 kg)  03/21/18 118 lb 6.4 oz (53.7 kg)  03/08/18 115 lb 8 oz (52.4 kg)     Exam:    Vital Signs:  BP (!) 142/68   Pulse (!) 50   Temp 97.7 F (36.5 C)   Ht 4\' 8"  (1.422 m)   Wt 112 lb (50.8 kg)   BMI 25.11 kg/m    No exam due to telephone visit  ASSESSMENT & PLAN:    1. Stroke 2. Hypertension 3. Dyslipidemia 4. Memory loss  K. Romeo Apple has not had any new stroke symptoms.  We do not have any lab work which I will order today to reassess her lipid profile.  She is still wearing a 30-day monitor and hopefully will have that results in the next week or 2.  Her blood pressure is apparently been running high.  Today was 142/68.  I advised her to increase her losartan from 50 to 100 mg daily.  Her husband will monitor blood pressures and will review this on follow-up in 1 month.  COVID-19 Education: The signs and symptoms of COVID-19 were discussed with the patient and how to seek care for testing (follow up with PCP or arrange E-visit).  The importance of social distancing was discussed today.  Patient Risk:   After full review of this patients clinical status, I feel that they are at least moderate risk at this time.  Time:   Today, I have spent 25 minutes with the patient with telehealth technology discussing hypertension, dyslipidemia, stroke, memory loss.     Medication Adjustments/Labs and Tests Ordered: Current medicines are reviewed at length with the patient today.  Concerns regarding medicines are outlined above.   Tests Ordered: No orders of the defined types were placed in this encounter.   Medication Changes: No orders of the defined types were placed in this encounter.   Disposition:  in 1 month(s)  Chrystie Nose, MD, [facility], FACP  [facility]  Medical Director of the Advanced Lipid Disorders &  Cardiovascular Risk Reduction Clinic Diplomate of the American  Board of Clinical Lipidology Attending Cardiologist  Direct Dial: [phone]  Fax: [phone]  Website:  [email or web address]  Chrystie Nose, MD  06/03/2018 9:03 AM

## 2018-06-03 NOTE — Patient Instructions (Signed)
Medication Instructions:  INCREASE losartan to 100mg  daily - updated prescription sent to pharamcy CONTINUE all other current medications  If you need a refill on your cardiac medications before your next appointment, please call your pharmacy.   Lab work: FASTING lab work to check cholesterol This can be completed at Dr. Blanchie Dessert office - no appointment is needed. Lab is open M-F from 8-4:30.  If you have labs (blood work) drawn today and your tests are completely normal, you will receive your results only by: Marland Kitchen MyChart Message (if you have MyChart) OR . A paper copy in the mail If you have any lab test that is abnormal or we need to change your treatment, we will call you to review the results.  Testing/Procedures: Please continue wearing monitor. Please return monitor as directed per monitor instructions. We will call with results.  Follow-Up: At [facility], you and your health needs are our priority.  As part of our continuing mission to provide you with exceptional heart care, we have created designated Provider Care Teams.  These Care Teams include your primary Cardiologist (physician) and Advanced Practice Providers (APPs -  Physician Assistants and Nurse Practitioners) who all work together to provide you with the care you need, when you need it. You will need a follow up appointment in 4 weeks - virtual visit for BP check & monitor follow up. You may see Chrystie Nose, MD or one of the following Advanced Practice Providers on your designated Care Team: [place], [place] . Micah Flesher, PA-C

## 2018-06-03 NOTE — Telephone Encounter (Signed)
Patient called to review e-visit instructions. Patient aware that the following changes have been made: increase losartan to 100mg  QD Patient aware that they will need the following labs: fasting lipid panel  Patient aware that they will need the following test(s): none - just complete monitor currently wearing  Recall for N/A entered. Scheduler to contact patient for 1 month f/up   No further assistance needed at this time.

## 2018-06-09 DIAGNOSIS — E782 Mixed hyperlipidemia: Secondary | ICD-10-CM | POA: Diagnosis not present

## 2018-06-09 LAB — LIPID PANEL
Chol/HDL Ratio: 2.6 ratio (ref 0.0–4.4)
Cholesterol, Total: 145 mg/dL (ref 100–199)
HDL: 55 mg/dL (ref >39)
LDL Calculated: 67 mg/dL (ref 0–99)
Triglycerides: 116 mg/dL (ref 0–149)
VLDL Cholesterol Cal: 23 mg/dL (ref 5–40)

## 2018-06-14 ENCOUNTER — Other Ambulatory Visit: Payer: Self-pay

## 2018-06-14 MED ORDER — MEMANTINE HCL 10 MG PO TABS: 10.0000 mg | ORAL_TABLET | Freq: Two times a day (BID) | ORAL | 1 refills | Status: DC

## 2018-07-22 ENCOUNTER — Telehealth: Payer: Self-pay | Admitting: Internal Medicine

## 2018-07-22 NOTE — Telephone Encounter (Signed)
LVM, reminding pt of her appt on 07-25-18 with Dr Debara Pickett.

## 2018-07-25 ENCOUNTER — Telehealth: Payer: Self-pay

## 2018-07-25 ENCOUNTER — Telehealth: Payer: Medicare Other | Admitting: Internal Medicine

## 2018-07-25 NOTE — Telephone Encounter (Signed)
Spoke with pt husband he did not know where pt was and that when he got in contact with he rhe would have her to call the office to make another appointment.

## 2018-10-30 ENCOUNTER — Other Ambulatory Visit: Payer: Self-pay | Admitting: Neurology

## 2018-10-31 ENCOUNTER — Other Ambulatory Visit: Payer: Self-pay

## 2018-10-31 MED ORDER — MEMANTINE HCL 10 MG PO TABS: 10.0000 mg | ORAL_TABLET | Freq: Two times a day (BID) | ORAL | 0 refills | Status: DC

## 2018-11-11 ENCOUNTER — Telehealth: Payer: Self-pay | Admitting: Neurology

## 2018-11-11 NOTE — Telephone Encounter (Signed)
Pt is needing a refill for her memantine (NAMENDA) 10 MG tablet sent to [facility] [address]

## 2018-11-14 NOTE — Telephone Encounter (Signed)
PT was sent 3 month refill on 10/31/2018 to [facility] [address].

## 2018-11-21 NOTE — Telephone Encounter (Signed)
If patient or husband call back they need to call the pharmacy. The current prescription that was sent 10/26 cannot be refill till 12/11/2018.Patient is requesting the refill too early. Insurance will not pay for new rx till 10/31/2018. PEr the pharmacy pt was given a 3 month supply in September 2020 and should not be out of meds.   Detail vm was left for patients husband on his listed number.

## 2018-11-21 NOTE — Telephone Encounter (Signed)
Pt is asking for the refill on the memantine (NAMENDA) 10 MG tablet  The pharmacy states they do not have record of 3 month refill.  Please call

## 2018-11-21 NOTE — Telephone Encounter (Signed)
[facility] and stated a refill was sent 10/31/2018 for 90 days. The pharmacy tech stated cannot get current rx refill till 12/11/2018. That's when insurance will pay for it. She stated pt last receive refill on 09/26 and was given a 3 month suppy.

## 2018-11-21 NOTE — Telephone Encounter (Signed)
If patient or husband call back please read them the below message. A mychart message was sent today to pt and husband.

## 2018-11-23 NOTE — Telephone Encounter (Signed)
Left vm on 11/16, 11/18 for pts husband about pts was given 90 day supply in September and can pick up new rx on 12/11/2018.

## 2018-12-06 ENCOUNTER — Other Ambulatory Visit: Payer: Self-pay

## 2018-12-06 ENCOUNTER — Encounter: Payer: Self-pay | Admitting: Adult Health

## 2018-12-06 ENCOUNTER — Ambulatory Visit (INDEPENDENT_AMBULATORY_CARE_PROVIDER_SITE_OTHER): Payer: Medicare Other | Admitting: Adult Health

## 2018-12-06 VITALS — BP 150/80 | HR 54 | Temp 98.4°F | Ht <= 58 in | Wt 105.9 lb

## 2018-12-06 DIAGNOSIS — E785 Hyperlipidemia, unspecified: Secondary | ICD-10-CM | POA: Diagnosis not present

## 2018-12-06 DIAGNOSIS — I10 Essential (primary) hypertension: Secondary | ICD-10-CM | POA: Diagnosis not present

## 2018-12-06 DIAGNOSIS — I63311 Cerebral infarction due to thrombosis of right middle cerebral artery: Secondary | ICD-10-CM | POA: Diagnosis not present

## 2018-12-06 DIAGNOSIS — F0150 Vascular dementia without behavioral disturbance: Secondary | ICD-10-CM

## 2018-12-06 NOTE — Progress Notes (Signed)
[facility] [address] [place]. [place] [address] [phone]       OFFICE FOLLOW UP VISIT NOTE  Ms. Rome Schlauch Date of Birth:  1945/07/11 Medical Record Number:  [id number]   Referring MD:  Marvel Plan Reason for Referral: Stroke follow-up  QMV:HQIONGE visit 11/10/2017 Dr. Pearlean Brownie; Ms Jacinta Penalver is a pleasant 73 year old [place] lady who is seen today for initial office consultation visit for stroke.  She is accompanied by her husband.  History is obtained from them and review of electronic medical records.  I have personally reviewed imaging films. HPI ( Dr Wilford Corner ) Mardi Mainland Shreena Baines is an 73 y.o. female with a PMH of HTN who presented to the ED with a 2 day history of speech changes, AMS and left face weakness.Per patient family member these symptoms have been intermittent for weeks, but for the past 2 days the patient has had increasing confusion. She is doing odd things like attempting to make coffee w/o coffee grounds. She is becoming more easily agitated and argues about the time. 2 days ago he noticed that the left side of her face seemed different and that she was not speaking as clearly. Denies any ETOH, or drug abuse. Denies any CP or SOB. Due to the persistence of symptoms and then becoming more frequent as well as the facial droop, there has been decided to bring the patient to the emergency room for evaluation.Noncontrast CT of the head was done that showed a hypodensity suggestive of subacute infarct in the right basal ganglia/caudate.Neurological consultation was obtained for further evaluation. .LKW: 2-4 day ago; uncertain of time NIHSS: 3 MRI scan of the brain showed right frontal deep white matter infarct extending into the head of the caudate with scattered peripheral cortical right frontal infarcts.  MRI of the brain showed a 5 mm distal right MCA aneurysm.  2D echo showed normal ejection fraction without cardiac source of embolus.  LDL  cholesterol was elevated at 180 mg percent.  Hemoglobin A1c was 6.1.  CT angiogram of the head and neck showed right common carotid artery thrombus which was nonocclusive.  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  Patient was felt to have embolized distally from the right common carotid artery thrombus and hence was started on anticoagulation with warfarin and on Lipitor for his elevated lipids.  Patient is currently living at home with her husband.  She has significant cognitive impairment with short-term memory being very poor.  She is also quite slow to process information and has trouble with multitasking.  She requires supervision for activities like cooking and working in the kitchen which she used to love.  She has left the burners on several times while cooking now.  She is tolerating warfarin well without bleeding or bruising and INR has been fairly stable.  Her facial droop and speech appear to have improved.  Patient's husband is wondering when her cognitive difficulties are going to improve and whether she will be able to become fully independent.  She has no prior history of cognitive impairment or memory difficulties.  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 Update 01/18/2018 Dr. Pearlean Brownie : She returns for follow-up after last visit 2 months ago.  She is accompanied by her husband.  She continues to have cognitive impairment and memory difficulties which are not improving.  She has very poor short-term memory.  She cannot be left alone.  She  needs constant reminders and supervision.  She is unable to do things that she used to do in the past and husband needs to keep a close eye on her.  She did undergo lab work for reversible causes of cognitive impairment on 11/10/2017 and vitamin B12, TSH and RPR were all normal.  EEG done on 12/07/2017 was also normal.  CT angiogram of the brain and neck done on 11/19/2017 showed no significant for current  intra-and extracranial stenosis and the previously seen right carotid thrombus had completely resolved.  Patient has poor appetite and has weight loss and hence would not be a good candidate for Aricept.  She has not tried [place] yet.  She is tolerating aspirin well without any bleeding or bruising.  She has stopped Coumadin after his CT Angie results showed resolution of the clot.  Her blood pressure is well controlled and today it is 121/6 3.  The patient was having a dry cough and her primary physician Dr. Jarold MottoPatterson plans to change lisinopril to alternative blood pressure medicine.  She remains on Lipitor which is tolerating well without any side effects.  She has not had any recurrent stroke or TIA symptoms.  Update 12/06/2018: Ms. Elita Booneash is a 73 year old female who is being seen today for stroke follow-up accompanied by her husband.  Residual deficits include cognitive impairment.  Was initiated on Namenda at prior visit.  She has continued on Namenda 10 mg daily tolerating well without side effects.  Memory has been overall stable.  Per husband, she did miss a couple doses of Namenda and he did notice a great difference in her cognition.  He tries to encourage her to do memory exercises but she does not do them routinely.  He continues to provide 24/7 care and supervision due to cognitive impairment and safety concerns.  Continues on aspirin 325 mg daily and atorvastatin 80 mg daily for secondary stroke prevention without side effects.  Blood pressure today elevated initially and on recheck 150/80.  Monitor his levels at home which has been stable.  She was evaluated by Dr. Vickey Hugerohmeier on 03/08/2018 for possible sleep apnea and recommended undergoing sleep testing but has not done so at this time as office has attempted to call twice but unable to contact.  No further concerns at this time.   ROS:   14 system review of systems is positive for memory loss, confusion and all other systems negative   PMH:    Past Medical History:  Diagnosis Date   Cerebrovascular accident (CVA) (HCC)    Hypertension    no medications for years    Social History:  Social History   Socioeconomic History   Marital status: Married    Spouse name: Not on file   Number of children: Not on file   Years of education: Not on file   Highest education level: Not on file  Occupational History   Occupation: retired  Ecologistocial Needs   Financial resource strain: Not on file   Food insecurity    Worry: Not on file    Inability: Not on Occupational hygienistfile   Transportation needs    Medical: Not on file    Non-medical: Not on file  Tobacco Use   Smoking status: Never Smoker   Smokeless tobacco: Never Used  Substance and Sexual Activity   Alcohol use: Never    Frequency: Never   Drug use: Never   Sexual activity: Not on file  Lifestyle   Physical activity    Days  per week: Not on file    Minutes per session: Not on file   Stress: Not on file  Relationships   Social connections    Talks on phone: Not on file    Gets together: Not on file    Attends religious service: Not on file    Active member of club or organization: Not on file    Attends meetings of clubs or organizations: Not on file    Relationship status: Not on file   Intimate partner violence    Fear of current or ex partner: Not on file    Emotionally abused: Not on file    Physically abused: Not on file    Forced sexual activity: Not on file  Other Topics Concern   Not on file  Social History Narrative   Former Engineer, civil (consulting) with Company secretary.  Lives alone, but has 5 children in the area who are grown and supportive.  +Financial difficulties - trouble paying bills.  She has been married to her current husband for 3 years.    Medications:   Current Outpatient Medications on File Prior to Visit  Medication Sig Dispense Refill   aspirin 325 MG tablet Take 325 mg by mouth daily.     atorvastatin (LIPITOR) 80 MG tablet Take 1 tablet (80 mg  total) by mouth daily. 90 tablet 0   losartan (COZAAR) 100 MG tablet Take 1 tablet (100 mg total) by mouth daily. 90 tablet 3   memantine (NAMENDA) 10 MG tablet Take 1 tablet (10 mg total) by mouth 2 (two) times daily. 180 tablet 0   No current facility-administered medications on file prior to visit.     Allergies:  No Known Allergies  Physical Exam  Today's Vitals   12/06/18 0742 12/06/18 0822  BP: (!) 161/78 (!) 150/80  Pulse: (!) 54   Temp: 98.4 F (36.9 C)   SpO2: 98%   Weight: 105 lb 14.4 oz (48 kg)   Height: 4\' 8"  (1.422 m)    Body mass index is 23.74 kg/m.    General: petite frail lady, seated, in no evident distress Head: head normocephalic and atraumatic.   Neck: supple with no carotid or supraclavicular bruits Cardiovascular: regular rate and rhythm, no murmurs Musculoskeletal: no deformity Skin:  no rash/petichiae Vascular:  Normal pulses all extremities  Neurologic Exam Mental Status: Awake and fully alert.  Oriented to month and year but disoriented to day of week, date and location.   MMSE - Mini Mental State Exam 12/06/2018  Orientation to time 2  Orientation to Place 2  Registration 3  Attention/ Calculation 0  Recall 0  Language- name 2 objects 2  Language- repeat 0  Language- follow 3 step command 2  Language- read & follow direction 1  Write a sentence 1  Copy design 0  Total score 13   Cranial Nerves: Pupils equal, briskly reactive to light. Extraocular movements full without nystagmus. Visual fields full to confrontation. Hearing intact. Facial sensation intact. Face, tongue, palate moves normally and symmetrically.  Motor: Normal bulk and tone. Normal strength in all tested extremity muscles.  Except diminished fine finger movements on the left.   Sensory.: intact to touch , pinprick , position and vibratory sensation.  Coordination: Rapid alternating movements normal in all extremities except mildly decreased left hand.  Finger-to-nose and heel-to-shin performed accurately bilaterally. Gait and Station: Arises from chair without difficulty. Stance is normal. Gait demonstrates normal stride length and balance .  Unable to  heel, toe and tandem walk   Reflexes: 1+ and symmetric. Toes downgoing.       ASSESSMENT: 73 year old lady with right MCA branch infarct in September 2019 secondary to thromboembolism from right carotid thrombus with significant residual vascular cognitive impairment.  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  Initiated on Namenda tolerating well with stabilization of cognitive impairment     PLAN: -Advised to continue aspirin 325 mg daily and atorvastatin 80 mg daily for secondary stroke prevention -Continuation of Namenda 10 mg twice daily for vascular cognitive impairment -Highly encourage memory exercises such as puzzles, reading and card games -Continue to follow with PCP for HTN and HLD management -Maintain strict control of hypertension with blood pressure goal below 130/90 and lipids with LDL cholesterol goal below 70 mg percent.  She was also advised to eat a healthy diet with lots of fruits vegetables, cereals and to be active.    Follow-up in 6 months or call earlier if needed  Greater than 50% time during this 25-minute   visit was spent on counseling and coordination of care about her stroke, prior carotid thrombus, cognitive impairment and vascular dementia and answering questions to patient and husband satisfaction.  Frann Rider, AGNP-BC  [facility] [address] [place], [place] [address]  Phone [phone] Fax [phone] Note: This document was prepared with digital dictation and possible smart phrase technology. Any transcriptional errors that result from this process are unintentional.

## 2018-12-06 NOTE — Patient Instructions (Signed)
Continue Namenda 10 mg twice daily for cognitive impairment.  Recommend memory exercises which can potentially improve your overall cognition.  Continue aspirin 81 mg daily  and Lipitor for secondary stroke prevention  Continue to follow up with PCP regarding cholesterol and blood pressure management   Continue to monitor blood pressure at home  Maintain strict control of hypertension with blood pressure goal below 130/90, diabetes with hemoglobin A1c goal below 6.5% and cholesterol with LDL cholesterol (bad cholesterol) goal below 70 mg/dL. I also advised the patient to eat a healthy diet with plenty of whole grains, cereals, fruits and vegetables, exercise regularly and maintain ideal body weight.  Followup in the future with me in 6 months or call earlier if needed       Thank you for coming to see [place] at [facility]. I hope we have been able to provide you high quality care today.  You may receive a patient satisfaction survey over the next few weeks. We would appreciate your feedback and comments so that we may continue to improve ourselves and the health of our patients.

## 2018-12-07 NOTE — Progress Notes (Signed)
I agree with the above plan 

## 2019-03-02 ENCOUNTER — Other Ambulatory Visit: Payer: Self-pay | Admitting: Internal Medicine

## 2019-03-02 ENCOUNTER — Ambulatory Visit: Payer: Medicare Other | Attending: Internal Medicine

## 2019-03-02 DIAGNOSIS — Z23 Encounter for immunization: Secondary | ICD-10-CM | POA: Insufficient documentation

## 2019-03-02 NOTE — Progress Notes (Signed)
   Covid-19 Vaccination Clinic  Name:  Sherry Fitzgerald    MRN: [id number] DOB: 07-24-1945  03/02/2019  [name] was observed post Covid-19 immunization for 15 minutes without incidence. She was provided with Vaccine Information Sheet and instruction to access the V-Safe system.   [name] was instructed to call 911 with any severe reactions post vaccine: Marland Kitchen Difficulty breathing  . Swelling of your face and throat  . A fast heartbeat  . A bad rash all over your body  . Dizziness and weakness    Immunizations Administered    Name Date Dose VIS Date Route   Pfizer COVID-19 Vaccine 03/02/2019  9:54 AM 0.3 mL 12/16/2018 Intramuscular   Manufacturer: [employer], [employer]   Lot: [id number]   NDC: [address]-0

## 2019-03-05 ENCOUNTER — Other Ambulatory Visit: Payer: Self-pay | Admitting: Neurology

## 2019-03-22 ENCOUNTER — Ambulatory Visit: Payer: Medicare Other | Attending: Internal Medicine

## 2019-03-22 DIAGNOSIS — Z23 Encounter for immunization: Secondary | ICD-10-CM

## 2019-03-22 NOTE — Progress Notes (Signed)
   Covid-19 Vaccination Clinic  Name:  Daliyah Sramek    MRN: [id number] DOB: 1945-10-30  03/22/2019  Ms. Eldana Isip was observed post Covid-19 immunization for 15 minutes without incident. She was provided with Vaccine Information Sheet and instruction to access the V-Safe system.   Ms. Ada Holness was instructed to call 911 with any severe reactions post vaccine: Marland Kitchen Difficulty breathing  . Swelling of face and throat  . A fast heartbeat  . A bad rash all over body  . Dizziness and weakness   Immunizations Administered    Name Date Dose VIS Date Route   Pfizer COVID-19 Vaccine 03/22/2019 11:43 AM 0.3 mL 12/16/2018 Intramuscular   Manufacturer: [employer], [employer]   Lot: [id number]   NDC: [id number]

## 2019-05-24 ENCOUNTER — Other Ambulatory Visit: Payer: Self-pay | Admitting: Internal Medicine

## 2019-05-29 ENCOUNTER — Other Ambulatory Visit: Payer: Self-pay | Admitting: Neurology

## 2019-06-06 NOTE — Progress Notes (Signed)
[facility] [address] [place]. [place] [address] [phone]       OFFICE FOLLOW UP VISIT NOTE  [name] Date of Birth:  [birth year] Medical Record Number:  [id number]   Referring MD:  Marvel Plan Reason for Referral: Stroke follow-up   Chief complaint: Chief Complaint  Patient presents with  . Follow-up    Rm 9; but husband  . Cerebrovascular Accident    Residual cognitive impairment      HPI:  Today, 06/06/2019, [name] returns for stroke follow-up accompanied by her husband.  Residual deficits of cognitive impairment with husband reporting slow decline since prior visit greater with short-term memory, concentration and taking longer to perform normal activities.  He does endorse occasional wandering but no other behavioral concerns.  No evidence of depression or anxiety.  She does require assistance for IADLs and minimal assistance for ADLs.  MMSE today 14/30 (prior 13/30).  Continues on Namenda 10 mg twice daily with ongoing benefit and denies side effects.  Husband does notice a difference if she misses Namenda dose.  Continues on aspirin 325 mg daily and atorvastatin for secondary stroke prevention.  Lipid panel obtained 1 year prior satisfactory.  No recent lab work.  Blood pressure today elevated at 163/63, asymptomatic.  Blood pressure not routinely monitored at home.  No concerns at this time.    History provided for reference purposes only Update 12/06/2018 JM: [name] is a 74 year old female who is being seen today for stroke follow-up accompanied by her husband.  Residual deficits include cognitive impairment.  Was initiated on Namenda at prior visit.  She has continued on Namenda 10 mg daily tolerating well without side effects.  Memory has been overall stable.  Per husband, she did miss a couple doses of Namenda and he did notice a great difference in her cognition.  He tries to encourage her to do memory exercises but she does not do  them routinely.  He continues to provide 24/7 care and supervision due to cognitive impairment and safety concerns.  Continues on aspirin 325 mg daily and atorvastatin 80 mg daily for secondary stroke prevention without side effects.  Blood pressure today elevated initially and on recheck 150/80.  Monitor his levels at home which has been stable.  She was evaluated by Dr. Vickey Huger on 03/08/2018 for possible sleep apnea and recommended undergoing sleep testing but has not done so at this time as office has attempted to call twice but unable to contact.  No further concerns at this time.  Update 01/18/2018 Dr. Pearlean Brownie : She returns for follow-up after last visit 2 months ago.  She is accompanied by her husband.  She continues to have cognitive impairment and memory difficulties which are not improving.  She has very poor short-term memory.  She cannot be left alone.  She needs constant reminders and supervision.  She is unable to do things that she used to do in the past and husband needs to keep a close eye on her.  She did undergo lab work for reversible causes of cognitive impairment on 11/10/2017 and vitamin B12, TSH and RPR were all normal.  EEG done on 12/07/2017 was also normal.  CT angiogram of the brain and neck done on 11/19/2017 showed no significant for current intra-and extracranial stenosis and the previously seen right carotid thrombus had completely resolved.  Patient has poor appetite and has weight loss and hence would not be a good candidate for Aricept.  She has not tried [place] yet.  She is tolerating aspirin well without any bleeding or bruising.  She has stopped Coumadin after his CT Angie results showed resolution of the clot.  Her blood pressure is well controlled and today it is 121/6 3.  The patient was having a dry cough and her primary physician Dr. Sharlett Iles plans to change lisinopril to alternative blood pressure medicine.  She remains on Lipitor which is tolerating well without any side  effects.  She has not had any recurrent stroke or TIA symptoms.  Initial visit 11/10/2017 Dr. Leonie Man; [name] is a pleasant 74 year old [place] lady who is seen today for initial office consultation visit for stroke.  She is accompanied by her husband.  History is obtained from them and review of electronic medical records.  I have personally reviewed imaging films. HPI ( Dr Rory Percy ) Sherry Fitzgerald is an 74 y.o. female with a PMH of HTN who presented to the ED with a 2 day history of speech changes, AMS and left face weakness.Per patient family member these symptoms have been intermittent for weeks, but for the past 2 days the patient has had increasing confusion. She is doing odd things like attempting to make coffee w/o coffee grounds. She is becoming more easily agitated and argues about the time. 2 days ago he noticed that the left side of her face seemed different and that she was not speaking as clearly. Denies any ETOH, or drug abuse. Denies any CP or SOB. Due to the persistence of symptoms and then becoming more frequent as well as the facial droop, there has been decided to bring the patient to the emergency room for evaluation.Noncontrast CT of the head was done that showed a hypodensity suggestive of subacute infarct in the right basal ganglia/caudate.Neurological consultation was obtained for further evaluation. .LKW: 2-4 day ago; uncertain of time NIHSS: 3 MRI scan of the brain showed right frontal deep white matter infarct extending into the head of the caudate with scattered peripheral cortical right frontal infarcts.  MRI of the brain showed a 5 mm distal right MCA aneurysm.  2D echo showed normal ejection fraction without cardiac source of embolus.  LDL cholesterol was elevated at 180 mg percent.  Hemoglobin A1c was 6.1.  CT angiogram of the head and neck showed right common carotid artery thrombus which was nonocclusive.  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  Patient was  felt to have embolized distally from the right common carotid artery thrombus and hence was started on anticoagulation with warfarin and on Lipitor for his elevated lipids.  Patient is currently living at home with her husband.  She has significant cognitive impairment with short-term memory being very poor.  She is also quite slow to process information and has trouble with multitasking.  She requires supervision for activities like cooking and working in the kitchen which she used to love.  She has left the burners on several times while cooking now.  She is tolerating warfarin well without bleeding or bruising and INR has been fairly stable.  Her facial droop and speech appear to have improved.  Patient's husband is wondering when her cognitive difficulties are going to improve and whether she will be able to become fully independent.  She has no prior history of cognitive impairment or memory difficulties.  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     ROS:   14  system review of systems is positive for memory loss, confusion and all other systems negative   PMH:  Past Medical History:  Diagnosis Date  . Cerebrovascular accident (CVA) (HCC)   . Hypertension    no medications for years    Social History:  Social History   Socioeconomic History  . Marital status: Married    Spouse name: Not on file  . Number of children: Not on file  . Years of education: Not on file  . Highest education level: Not on file  Occupational History  . Occupation: retired  Tobacco Use  . Smoking status: Never Smoker  . Smokeless tobacco: Never Used  Substance and Sexual Activity  . Alcohol use: Never  . Drug use: Never  . Sexual activity: Not on file  Other Topics Concern  . Not on file  Social History Narrative   Former Editor, commissioning.  Lives alone, but has 5 children in the area who are grown and supportive.  +Financial difficulties -  trouble paying bills.  She has been married to her current husband for 3 years.   Social Determinants of Health   Financial Resource Strain:   . Difficulty of Paying Living Expenses:   Food Insecurity:   . Worried About Programme researcher, broadcasting/film/video in the Last Year:   . Barista in the Last Year:   Transportation Needs:   . Freight forwarder (Medical):   Marland Kitchen Lack of Transportation (Non-Medical):   Physical Activity:   . Days of Exercise per Week:   . Minutes of Exercise per Session:   Stress:   . Feeling of Stress :   Social Connections:   . Frequency of Communication with Friends and Family:   . Frequency of Social Gatherings with Friends and Family:   . Attends Religious Services:   . Active Member of Clubs or Organizations:   . Attends Banker Meetings:   Marland Kitchen Marital Status:   Intimate Partner Violence:   . Fear of Current or Ex-Partner:   . Emotionally Abused:   Marland Kitchen Physically Abused:   . Sexually Abused:     Medications:   Current Outpatient Medications on File Prior to Visit  Medication Sig Dispense Refill  . aspirin 325 MG tablet Take 325 mg by mouth daily.    Marland Kitchen atorvastatin (LIPITOR) 80 MG tablet Take 1 tablet (80 mg total) by mouth daily. 90 tablet 0  . losartan (COZAAR) 100 MG tablet Take 1 tablet (100 mg total) by mouth daily. 90 tablet 0  . memantine (NAMENDA) 10 MG tablet TAKE 1 TABLET BY MOUTH TWICE A DAY 180 tablet 0   No current facility-administered medications on file prior to visit.    Allergies:  No Known Allergies  Physical Exam  Today's Vitals   06/07/19 0804  BP: (!) 163/63  Pulse: (!) 54  Weight: 102 lb (46.3 kg)  Height: 4\' 8"  (1.422 m)   Body mass index is 22.87 kg/m.    General: petite frail lady, seated, in no evident distress Head: head normocephalic and atraumatic.   Neck: supple with no carotid or supraclavicular bruits Cardiovascular: regular rate and rhythm, no murmurs Musculoskeletal: no  deformity Skin:  no rash/petichiae Vascular:  Normal pulses all extremities  Neurologic Exam Mental Status: Awake and fully alert.  Oriented to month and year but disoriented to day of week, date and location.   MMSE - Mini Mental State Exam 06/07/2019 12/06/2018  Orientation to time  0 2  Orientation to Place 2 2  Registration 3 3  Attention/ Calculation 1 0  Recall 0 0  Language- name 2 objects 2 2  Language- repeat 1 0  Language- follow 3 step command 3 2  Language- read & follow direction 1 1  Write a sentence 0 1  Copy design 1 0  Total score 14 13   Cranial Nerves: Pupils equal, briskly reactive to light. Extraocular movements full without nystagmus. Visual fields full to confrontation. Hearing intact. Facial sensation intact. Face, tongue, palate moves normally and symmetrically.  Motor: Normal bulk and tone. Normal strength in all tested extremity muscles.  Except diminished fine finger movements on the left.   Sensory.: intact to touch , pinprick , position and vibratory sensation.  Coordination: Rapid alternating movements normal in all extremities except mildly decreased left hand. Finger-to-nose and heel-to-shin performed accurately bilaterally. Gait and Station: Arises from chair without difficulty. Stance is normal. Gait demonstrates normal stride length and balance .  Unable to heel, toe and tandem walk   Reflexes: 1+ and symmetric. Toes downgoing.       ASSESSMENT: 74 year old [place] lady with right MCA branch infarct in September 2019 secondary to thromboembolism from right carotid thrombus with significant residual vascular cognitive impairment.  Vascular risk factors of hypertension, carotid thrombus and hyperlipidemia. 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  Initiated on Namenda tolerating well with noticeable benefit     PLAN: -Advised to continue aspirin 325 mg daily and atorvastatin 80 mg daily for secondary stroke  prevention -We will repeat lipid panel and A1c for secondary stroke prevention measures -Continuation of Namenda 10 mg twice daily for vascular cognitive impairment -Highly encourage memory exercises such as puzzles, reading and card games as well as routine exercise, socialization, healthy diet and adequate sleep as well as importance of managing stroke risk factors -Continue to follow with PCP for HTN and HLD management as well as ongoing prescribing of atorvastatin -Encouraged monitoring blood pressure at home and to follow-up with PCP if remains elevated -Maintain strict control of hypertension with blood pressure goal below 130/90 and lipids with LDL cholesterol goal below 70 mg percent.  She was also advised to eat a healthy diet with lots of fruits vegetables, cereals and to be active.    Follow-up in 6 months or call earlier if needed  I spent 26 minutes of face-to-face and non-face-to-face time with patient and husband.  This included previsit chart review, lab review, study review, order entry, electronic health record documentation, patient education   Ihor Austin, [facility]  [facility] [address] [place], [place] [address]  Phone [phone] Fax [phone] Note: This document was prepared with digital dictation and possible smart phrase technology. Any transcriptional errors that result from this process are unintentional.

## 2019-06-07 ENCOUNTER — Ambulatory Visit (INDEPENDENT_AMBULATORY_CARE_PROVIDER_SITE_OTHER): Payer: Medicare Other | Admitting: Adult Health

## 2019-06-07 ENCOUNTER — Other Ambulatory Visit: Payer: Self-pay

## 2019-06-07 ENCOUNTER — Encounter: Payer: Self-pay | Admitting: Adult Health

## 2019-06-07 VITALS — BP 163/63 | HR 54 | Ht <= 58 in | Wt 102.0 lb

## 2019-06-07 DIAGNOSIS — F0150 Vascular dementia without behavioral disturbance: Secondary | ICD-10-CM | POA: Diagnosis not present

## 2019-06-07 DIAGNOSIS — E785 Hyperlipidemia, unspecified: Secondary | ICD-10-CM

## 2019-06-07 DIAGNOSIS — R7303 Prediabetes: Secondary | ICD-10-CM

## 2019-06-07 DIAGNOSIS — I10 Essential (primary) hypertension: Secondary | ICD-10-CM

## 2019-06-07 DIAGNOSIS — Z8673 Personal history of transient ischemic attack (TIA), and cerebral infarction without residual deficits: Secondary | ICD-10-CM | POA: Diagnosis not present

## 2019-06-07 NOTE — Patient Instructions (Addendum)
Continue Namenda 10 mg daily for moderate level cognitive impairment.  Continue aspirin 325 mg daily  and atorvastatin  for secondary stroke prevention  We will check cholesterol levels and diabetes levels for stroke prevention   Continue to follow up with PCP regarding cholesterol and blood pressure management   Continue to monitor blood pressure at home - if remains above 140, please follow up with PCP for further treatment options  Maintain strict control of hypertension with blood pressure goal below 130/90, diabetes with hemoglobin A1c goal below 6.5% and cholesterol with LDL cholesterol (bad cholesterol) goal below 70 mg/dL. I also advised the patient to eat a healthy diet with plenty of whole grains, cereals, fruits and vegetables, exercise regularly and maintain ideal body weight.  Followup in the future with me in 6 months or call earlier if needed       Thank you for coming to see [place] at [facility]. I hope we have been able to provide you high quality care today.  You may receive a patient satisfaction survey over the next few weeks. We would appreciate your feedback and comments so that we may continue to improve ourselves and the health of our patients.

## 2019-06-08 ENCOUNTER — Telehealth: Payer: Self-pay

## 2019-06-08 LAB — LIPID PANEL
Chol/HDL Ratio: 2.3 ratio (ref 0.0–4.4)
Cholesterol, Total: 150 mg/dL (ref 100–199)
HDL: 66 mg/dL (ref >39)
LDL Chol Calc (NIH): 69 mg/dL (ref 0–99)
Triglycerides: 77 mg/dL (ref 0–149)
VLDL Cholesterol Cal: 15 mg/dL (ref 5–40)

## 2019-06-08 LAB — HEMOGLOBIN A1C
Est. average glucose Bld gHb Est-mCnc: 114 mg/dL
Hgb A1c MFr Bld: 5.6 % (ref 4.8–5.6)

## 2019-06-08 NOTE — Progress Notes (Signed)
I agree with the above plan 

## 2019-06-08 NOTE — Telephone Encounter (Signed)
Results given to wife and husband , verified name and DOB, voiced understanding all questions answered.    Ihor Austin, NP  06/08/2019 6:51 AM EDT    Please advise patient and husband that recent cholesterol levels satisfactory as well as A1c

## 2019-08-17 ENCOUNTER — Other Ambulatory Visit: Payer: Self-pay | Admitting: Internal Medicine

## 2019-08-27 ENCOUNTER — Other Ambulatory Visit: Payer: Self-pay | Admitting: Neurology

## 2019-10-17 DIAGNOSIS — Z23 Encounter for immunization: Secondary | ICD-10-CM | POA: Diagnosis not present

## 2019-11-09 ENCOUNTER — Other Ambulatory Visit: Payer: Self-pay | Admitting: Internal Medicine

## 2019-12-07 ENCOUNTER — Ambulatory Visit (INDEPENDENT_AMBULATORY_CARE_PROVIDER_SITE_OTHER): Payer: Medicare Other | Admitting: Adult Health

## 2019-12-07 ENCOUNTER — Encounter: Payer: Self-pay | Admitting: Adult Health

## 2019-12-07 VITALS — BP 164/69 | HR 53 | Ht <= 58 in | Wt 105.0 lb

## 2019-12-07 DIAGNOSIS — E785 Hyperlipidemia, unspecified: Secondary | ICD-10-CM

## 2019-12-07 DIAGNOSIS — I10 Essential (primary) hypertension: Secondary | ICD-10-CM | POA: Diagnosis not present

## 2019-12-07 DIAGNOSIS — F0150 Vascular dementia without behavioral disturbance: Secondary | ICD-10-CM

## 2019-12-07 DIAGNOSIS — Z8673 Personal history of transient ischemic attack (TIA), and cerebral infarction without residual deficits: Secondary | ICD-10-CM

## 2019-12-07 NOTE — Patient Instructions (Addendum)
Continue Namenda 10 mg twice daily for vascular dementia  Continue aspirin 325 mg daily  and atorvastatin 80 mg daily for secondary stroke prevention  Continue to follow up with PCP regarding cholesterol and blood pressure management  Maintain strict control of hypertension with blood pressure goal below 130/90 and cholesterol with LDL cholesterol (bad cholesterol) goal below 70 mg/dL.      Followup in the future with me in 1 year or call earlier if needed      Thank you for coming to see [place] at [facility]. I hope we have been able to provide you high quality care today.  You may receive a patient satisfaction survey over the next few weeks. We would appreciate your feedback and comments so that we may continue to improve ourselves and the health of our patients.     Vascular Dementia Dementia is a condition in which a person has problems with thinking, memory, and behavior that are severe enough to interfere with daily life. Vascular dementia is a type of dementia. It results from brain damage that is caused by the brain not getting enough blood. This condition may also be called vascular cognitive impairment. What are the causes? Vascular dementia is caused by conditions that lessen blood flow to the brain. Common causes of this condition include:  Multiple small strokes. These may happen without symptoms (silent stroke).  Major stroke.  Damage to small blood vessels in the brain (cerebral small vessel disease). What increases the risk? The following factors may make you more likely to develop this condition:  Having had a stroke.  Having high blood pressure (hypertension) or high cholesterol.  Having a disease that affects the heart or blood vessels.  Smoking.  Having diabetes.  Having metabolic syndrome.  Being obese.  Not being active.  Having depression.  Being over age 54. What are the signs or symptoms? Symptoms can vary from one  person to another. Symptoms may be mild or severe depending on the amount of damage and which parts of the brain have been affected. Symptoms may begin suddenly or may develop slowly. Mental symptoms of vascular dementia may include:  Confusion.  Memory problems.  Poor attention and concentration.  Trouble understanding speech.  Depression.  Personality changes.  Trouble recognizing familiar people.  Agitation or aggression.  Paranoia.  Delusions or hallucinations. Physical symptoms of vascular dementia may include:  Weakness.  Poor balance.  Loss of bladder or bowel control (incontinence).  Unsteady walking (gait).  Speaking problems. Behavioral symptoms of vascular dementia may include:  Getting lost in familiar places.  Problems with planning and judgment.  Trouble following instructions.  Social problems.  Emotional outbursts.  Trouble with daily activities and self-care.  Problems handling money. Symptoms may remain stable, or they may get worse over time. Symptoms of vascular dementia may be similar to those of Alzheimer's disease. The two conditions can occur together (mixed dementia). How is this diagnosed? Your health care provider will consider your medical history and symptoms or changes that are reported by friends and family. Your health care provider will do a physical exam and may order lab tests or other tests that check brain and nervous system function. Tests that may be done include:  Blood tests.  Brain imaging tests.  Tests of movement, speech, and other daily activities (neurological exam).  Tests of memory, thinking, and problem-solving (neuropsychological or neurocognitive testing). There is not a specific test to diagnose vascular dementia. Diagnosis may involve several specialists.  These may include:  A health care provider who specializes in the brain and nervous system (neurologist).  A health provider who specializes in  understanding how problems in the brain can alter behavior and cognitive function (neuropsychologist). How is this treated? There is no cure for vascular dementia. Brain damage that has already occurred cannot be reversed. Treatment depends on:  How severe the condition is.  Which parts of your brain have been affected.  Your overall health. Treatment measures aim to:  Treat the underlying cause of vascular dementia and manage risk factors. This may include: ? Controlling blood pressure. ? Lowering cholesterol. ? Treating diabetes. ? Quitting smoking. ? Losing weight or maintaining a healthy weight. ? Eating a healthy, balanced diet. ? Getting regular exercise.  Manage symptoms.  Prevent further brain damage.  Improve the person's health and quality of life. Treatment for dementia may involve a team of health care providers, including:  A neurologist.  A provider who specializes in disorders of the mind (psychiatrist).  A provider who specializes in helping people learn daily living skills (occupational therapist).  A provider who focuses on speech and language changes (Doctor, general practice).  A heart specialist (cardiologist).  A provider who helps people learn how to manage physical changes, such as movement and walking (exercise physiologist or physical therapist). Follow these instructions at home: Lifestyle  People with vascular dementia may need regular help at home or daily care from a family member or home health care worker. Home care for a person with vascular dementia depends on what caused the condition and how severe the symptoms are. General guidelines for caregivers include:  Help the person with dementia remember people, appointments, and daily activities.  Help the person with dementia manage his or her medicines.  Help family and friends learn about ways to communicate with the person with dementia.  Create a safe living space to reduce the risk of  injury or falls.  Find a support group to help caregivers and family cope with the effects of dementia.  General instructions  Help the person take over-the-counter and prescription medicines only as told by the health care provider.  Follow the health care provider's instructions for treating the condition that caused the dementia.  Make sure the person keeps all follow-up visits as told by the health care provider. This is important. Contact a health care provider if:  A fever develops.  New behavioral problems develop.  Problems with swallowing develop.  Confusion gets worse.  Sleepiness gets worse. Get help right away if:  Loss of consciousness occurs.  There is a sudden loss of speech, balance, or thinking ability.  New numbness or paralysis occurs.  Sudden, severe headache occurs.  Vision is lost or suddenly gets worse in one or both eyes. Summary  Vascular dementia is a type of dementia. It results from brain damage that is caused by the brain not getting enough blood.  Vascular dementia is caused by conditions that lessen blood flow to the brain. Common causes of this condition include stroke and damage to small blood vessels in the brain.  Treatment focuses on treating the underlying cause of vascular dementia and managing any risk factors.  People with vascular dementia may need regular help at home or daily care from a family member or home health care worker.  Contact a health care provider if you or your caregiver notice any new symptoms. This information is not intended to replace advice given to you by your health care  provider. Make sure you discuss any questions you have with your health care provider. Document Revised: 09/22/2017 Document Reviewed: 09/23/2017 Elsevier Patient Education  2020 [employer].

## 2019-12-07 NOTE — Progress Notes (Signed)
I agree with the above plan 

## 2019-12-07 NOTE — Progress Notes (Signed)
[facility] [address] [place]. [place] [address] [phone]       OFFICE FOLLOW UP VISIT NOTE  [name] Date of Birth:  02/14/1945 Medical Record Number:  [id number]   Referring MD:  Marvel Plan Reason for Referral: Stroke follow-up   Chief complaint: Chief Complaint  Patient presents with  . Follow-up  . Vascular Dementia      HPI:  Today, 12/07/2019, [name] returns for 52-month stroke follow-up accompanied by her husband.  Cognitive impairment post stroke continues to slowly decline.  MMSE today 12/30 (prior 14/30).  Remains on Namenda with continued benefit subjectively and denies side effects.  He does report occasional perseveration but denies any other behavioral concerns.  Husband provides 24/7 care and supervision with patient continuing to be active as able.  Denies new stroke/TIA symptoms.  On aspirin 325 mg daily and atorvastatin 80 mg daily without side effects.  Recent lipid panel showed LDL 69.  Blood pressure today 164/69 (typically elevated at appointments). Stable at home per husband.  No further concerns at this time.    History provided for reference purposes only Update 06/06/2019 JM: [name] returns for stroke follow-up accompanied by her husband.  Residual deficits of cognitive impairment with husband reporting slow decline since prior visit greater with short-term memory, concentration and taking longer to perform normal activities.  He does endorse occasional wandering but no other behavioral concerns.  No evidence of depression or anxiety.  She does require assistance for IADLs and minimal assistance for ADLs.  MMSE today 14/30 (prior 13/30).  Continues on Namenda 10 mg twice daily with ongoing benefit and denies side effects.  Husband does notice a difference if she misses Namenda dose.  Continues on aspirin 325 mg daily and atorvastatin for secondary stroke prevention.  Lipid panel obtained 1 year prior satisfactory.   No recent lab work.  Blood pressure today elevated at 163/63, asymptomatic.  Blood pressure not routinely monitored at home.  No concerns at this time.  Update 12/06/2018 JM: [name] is a 74 year old female who is being seen today for stroke follow-up accompanied by her husband.  Residual deficits include cognitive impairment.  Was initiated on Namenda at prior visit.  She has continued on Namenda 10 mg daily tolerating well without side effects.  Memory has been overall stable.  Per husband, she did miss a couple doses of Namenda and he did notice a great difference in her cognition.  He tries to encourage her to do memory exercises but she does not do them routinely.  He continues to provide 24/7 care and supervision due to cognitive impairment and safety concerns.  Continues on aspirin 325 mg daily and atorvastatin 80 mg daily for secondary stroke prevention without side effects.  Blood pressure today elevated initially and on recheck 150/80.  Monitor his levels at home which has been stable.  She was evaluated by Dr. Vickey Huger on 03/08/2018 for possible sleep apnea and recommended undergoing sleep testing but has not done so at this time as office has attempted to call twice but unable to contact.  No further concerns at this time.  Update 01/18/2018 Dr. Pearlean Brownie : She returns for follow-up after last visit 2 months ago.  She is accompanied by her husband.  She continues to have cognitive impairment and memory difficulties which are not improving.  She has very poor short-term memory.  She cannot be left alone.  She needs constant reminders and supervision.  She is unable to do things that she used to do in the past and husband needs to keep a close eye on her.  She did undergo lab work for reversible causes of cognitive impairment on 11/10/2017 and vitamin B12, TSH and RPR were all normal.  EEG done on 12/07/2017 was also normal.  CT angiogram of the brain and neck done on 11/19/2017 showed no significant for  current intra-and extracranial stenosis and the previously seen right carotid thrombus had completely resolved.  Patient has poor appetite and has weight loss and hence would not be a good candidate for Aricept.  She has not tried [place] yet.  She is tolerating aspirin well without any bleeding or bruising.  She has stopped Coumadin after his CT Angie results showed resolution of the clot.  Her blood pressure is well controlled and today it is 121/6 3.  The patient was having a dry cough and her primary physician Dr. Jarold Motto plans to change lisinopril to alternative blood pressure medicine.  She remains on Lipitor which is tolerating well without any side effects.  She has not had any recurrent stroke or TIA symptoms.  Initial visit 11/10/2017 Dr. Pearlean Brownie; [name] is a pleasant 74 year old [place] lady who is seen today for initial office consultation visit for stroke.  She is accompanied by her husband.  History is obtained from them and review of electronic medical records.  I have personally reviewed imaging films. HPI ( Dr Wilford Corner ) Sherry Fitzgerald is an 74 y.o. female with a PMH of HTN who presented to the ED with a 2 day history of speech changes, AMS and left face weakness.Per patient family member these symptoms have been intermittent for weeks, but for the past 2 days the patient has had increasing confusion. She is doing odd things like attempting to make coffee w/o coffee grounds. She is becoming more easily agitated and argues about the time. 2 days ago he noticed that the left side of her face seemed different and that she was not speaking as clearly. Denies any ETOH, or drug abuse. Denies any CP or SOB. Due to the persistence of symptoms and then becoming more frequent as well as the facial droop, there has been decided to bring the patient to the emergency room for evaluation.Noncontrast CT of the head was done that showed a hypodensity suggestive of subacute infarct in the right  basal ganglia/caudate.Neurological consultation was obtained for further evaluation. .LKW: 2-4 day ago; uncertain of time NIHSS: 3 MRI scan of the brain showed right frontal deep white matter infarct extending into the head of the caudate with scattered peripheral cortical right frontal infarcts.  MRI of the brain showed a 5 mm distal right MCA aneurysm.  2D echo showed normal ejection fraction without cardiac source of embolus.  LDL cholesterol was elevated at 180 mg percent.  Hemoglobin A1c was 6.1.  CT angiogram of the head and neck showed right common carotid artery thrombus which was nonocclusive.  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  Patient was felt to have embolized distally from the right common carotid artery thrombus and hence was started on anticoagulation with warfarin and on Lipitor for his elevated lipids.  Patient is currently living at home with her husband.  She has significant cognitive impairment with short-term memory being very poor.  She is also quite slow to process information and has trouble with multitasking.  She requires supervision for activities like cooking and working in the  kitchen which she used to love.  She has left the burners on several times while cooking now.  She is tolerating warfarin well without bleeding or bruising and INR has been fairly stable.  Her facial droop and speech appear to have improved.  Patient's husband is wondering when her cognitive difficulties are going to improve and whether she will be able to become fully independent.  She has no prior history of cognitive impairment or memory difficulties.  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     ROS:   14 system review of systems is positive for memory loss, confusion and all other systems negative   PMH:  Past Medical History:  Diagnosis Date  . Cerebrovascular accident (CVA) (HCC)   . Hypertension    no medications for years     Social History:  Social History   Socioeconomic History  . Marital status: Married    Spouse name: Not on file  . Number of children: Not on file  . Years of education: Not on file  . Highest education level: Not on file  Occupational History  . Occupation: retired  Tobacco Use  . Smoking status: Never Smoker  . Smokeless tobacco: Never Used  Substance and Sexual Activity  . Alcohol use: Never  . Drug use: Never  . Sexual activity: Not on file  Other Topics Concern  . Not on file  Social History Narrative   Former Editor, commissioning.  Lives alone, but has 5 children in the area who are grown and supportive.  +Financial difficulties - trouble paying bills.  She has been married to her current husband for 3 years.   Social Determinants of Health   Financial Resource Strain:   . Difficulty of Paying Living Expenses: Not on file  Food Insecurity:   . Worried About Programme researcher, broadcasting/film/video in the Last Year: Not on file  . Ran Out of Food in the Last Year: Not on file  Transportation Needs:   . Lack of Transportation (Medical): Not on file  . Lack of Transportation (Non-Medical): Not on file  Physical Activity:   . Days of Exercise per Week: Not on file  . Minutes of Exercise per Session: Not on file  Stress:   . Feeling of Stress : Not on file  Social Connections:   . Frequency of Communication with Friends and Family: Not on file  . Frequency of Social Gatherings with Friends and Family: Not on file  . Attends Religious Services: Not on file  . Active Member of Clubs or Organizations: Not on file  . Attends Banker Meetings: Not on file  . Marital Status: Not on file  Intimate Partner Violence:   . Fear of Current or Ex-Partner: Not on file  . Emotionally Abused: Not on file  . Physically Abused: Not on file  . Sexually Abused: Not on file    Medications:   Current Outpatient Medications on File Prior to Visit  Medication Sig Dispense Refill  .  aspirin 325 MG tablet Take 325 mg by mouth daily.    Marland Kitchen atorvastatin (LIPITOR) 80 MG tablet Take 1 tablet (80 mg total) by mouth daily. 90 tablet 0  . losartan (COZAAR) 100 MG tablet TAKE 1 TABLET BY MOUTH EVERY DAY 90 tablet 0  . memantine (NAMENDA) 10 MG tablet TAKE 1 TABLET BY MOUTH TWICE A DAY 180 tablet 1   No current facility-administered  medications on file prior to visit.    Allergies:  No Known Allergies  Physical Exam  Today's Vitals   12/07/19 0836  BP: (!) 164/69  Pulse: (!) 53  Weight: 105 lb (47.6 kg)  Height: 4\' 9"  (1.448 m)   Body mass index is 22.72 kg/m.  General: petite frail [place] lady, seated, in no evident distress Head: head normocephalic and atraumatic.   Neck: supple with no carotid or supraclavicular bruits Cardiovascular: regular rate and rhythm, no murmurs Musculoskeletal: no deformity Skin:  no rash/petichiae Vascular:  Normal pulses all extremities  Neurologic Exam Mental Status: Awake and fully alert.  Disoriented to time and office name but oriented to city and state.  No evidence of aphasia or dysarthria.  Follows commands without difficulty.  Mood and affect appropriate. MMSE - Mini Mental State Exam 12/07/2019 06/07/2019 12/06/2018  Orientation to time 0 0 2  Orientation to Place 4 2 2   Registration 0 3 3  Attention/ Calculation 0 1 0  Recall 3 0 0  Language- name 2 objects 2 2 2   Language- repeat 0 1 0  Language- follow 3 step command 2 3 2   Language- read & follow direction 1 1 1   Write a sentence 0 0 1  Copy design 0 1 0  Total score 12 14 13    Cranial Nerves: Pupils equal, briskly reactive to light. Extraocular movements full without nystagmus. Visual fields full to confrontation. Hearing intact. Facial sensation intact. Face, tongue, palate moves normally and symmetrically.  Motor: Normal bulk and tone. Normal strength in all tested extremity muscles.   Sensory.: intact to touch , pinprick , position and vibratory sensation.   Coordination: Rapid alternating movements normal in all extremities. Finger-to-nose and heel-to-shin performed accurately bilaterally. Gait and Station: Arises from chair without difficulty. Stance is normal. Gait demonstrates normal stride length and balance without use of assistive device Reflexes: 1+ and symmetric. Toes downgoing.       ASSESSMENT/PLAN: 74 year old [place] lady with right MCA branch infarct in September 2019 secondary to thromboembolism from right carotid thrombus with significant residual vascular cognitive impairment.  Vascular risk factors of hypertension, carotid thrombus and hyperlipidemia.      1. R MCA stroke :  a. Residual deficit: Cognitive impairment (see #2).  b. Continue aspirin 325 mg daily  and atorvastatin 80 mg daily for secondary stroke prevention.   c. Discussed secondary stroke prevention measures and importance of close PCP f/u for aggressive stroke risk factor management  2. Vascular dementia w/o behaviors:  a. MMSE today 12 (prior 14/30).  Subjective slow decline b. continue Namenda 10 mg twice daily. husband not interested in any other type of medication such as Aricept especially as unknown benefit c. Highly encourage memory exercises such as puzzles, reading and card games as well as routine exercise, socialization, healthy diet and adequate sleep as well as importance of managing stroke risk factors 3. HTN: BP goal <130/90.  Stable at home on losartan per PCP 4. HLD: LDL goal <70. Recent LDL 69. On atorvastatin 80 mg daily per PCP    Follow-up in 1 year or call earlier if needed   CC:  [facility] provider: Dr. Newt LukesSethi Paterson, Reuel Boomaniel, MD    I spent 40 minutes of face-to-face and non-face-to-face time with patient and husband.  This included previsit chart review, lab review, study review, order entry, electronic health record documentation, patient and husband education and discussion regarding history of stroke, residual cognitive  impairment likely vascular dementia and ongoing  medication management, completion and review of MMSE, importance of managing stroke risk factors and answered all other questions to patient and husband satisfaction   Ihor Austin, AGNP-BC  [facility] [address] [place], [place] [address]  Phone [phone] Fax [phone] Note: This document was prepared with digital dictation and possible smart phrase technology. Any transcriptional errors that result from this process are unintentional.

## 2019-12-30 IMAGING — CT CT HEAD W/O CM
4 series · 16 of 47 positions shown, 18 images · non-contrast
Comparison: 08/25/2017

CLINICAL DATA: Dizziness. Altered mental status and weakness.
Memory impairment.

EXAM:
CT HEAD WITHOUT CONTRAST
TECHNIQUE: Contiguous axial images were obtained from the base of the skull
through the vertex without intravenous contrast.

[Series 3: head wo · axial · 0.42mm/px · z∈[-78,+26]mm · 7 of 29 slices shown, 9 images]
[im 4/29  brain]
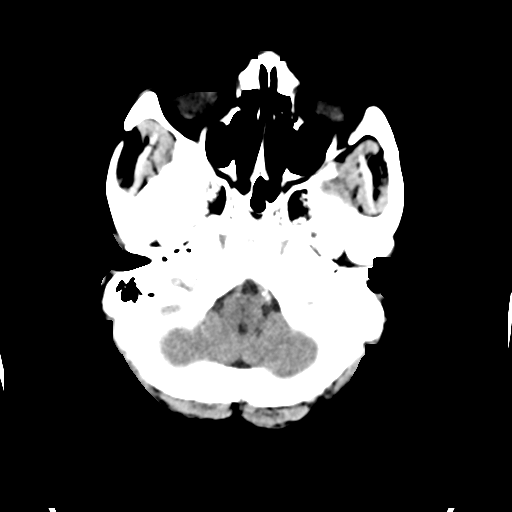
[im 4/29  bone]
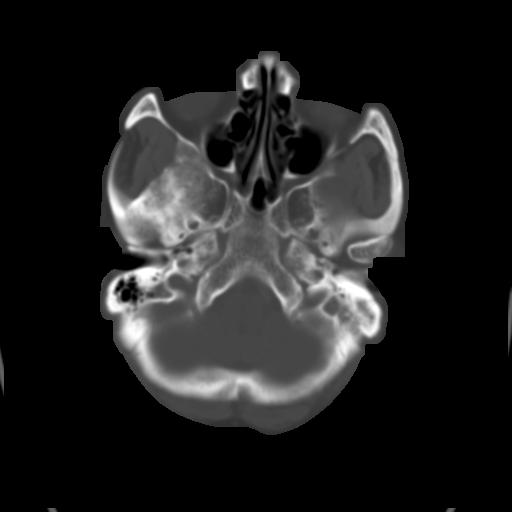
[im 8/29  brain]
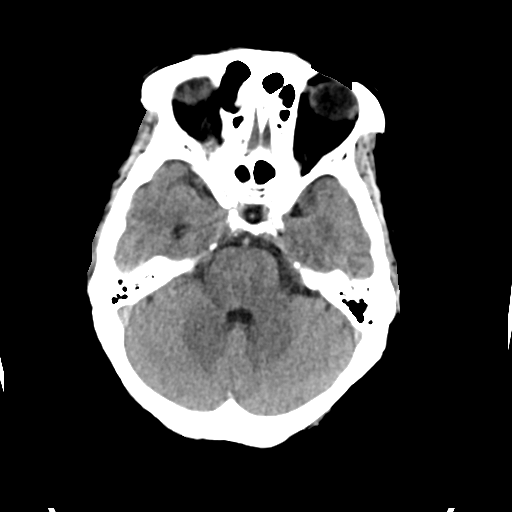
[im 11/29  brain]
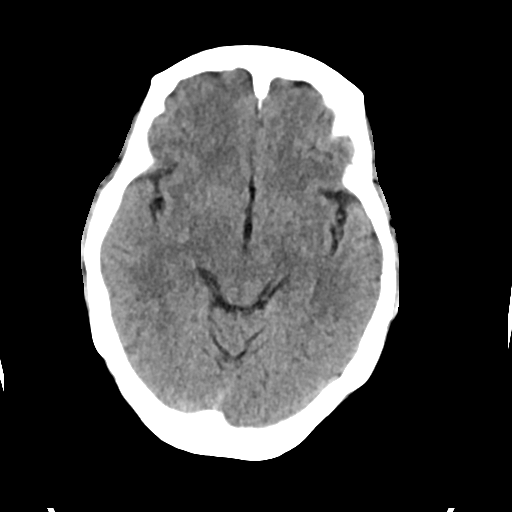
[im 15/29  brain]
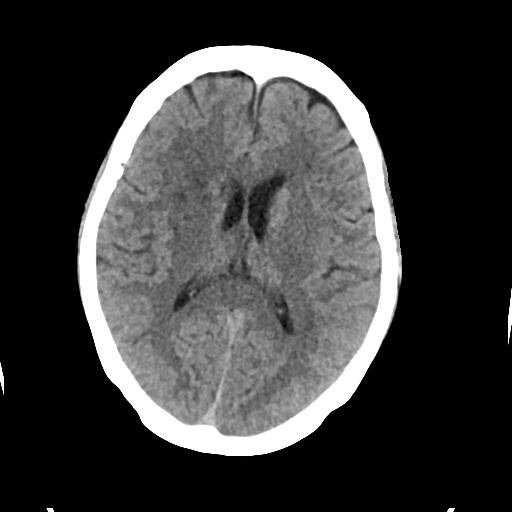
[im 18/29  brain]
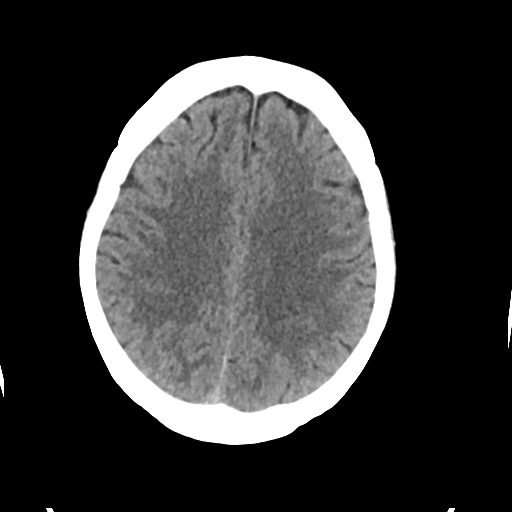
[im 18/29  bone]
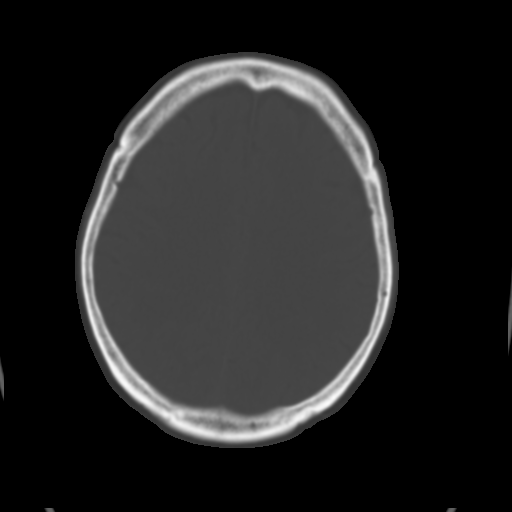
[im 22/29  brain]
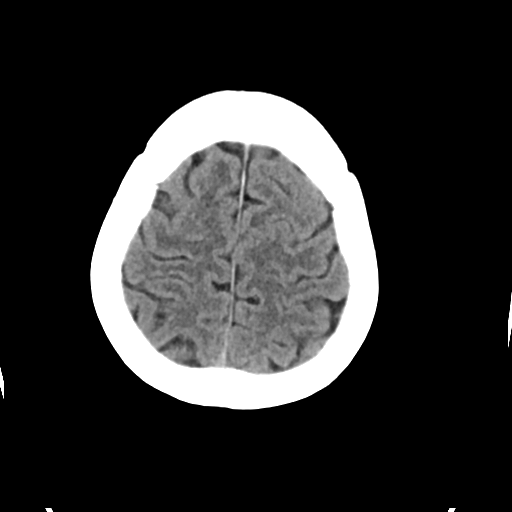
[im 25/29  brain]
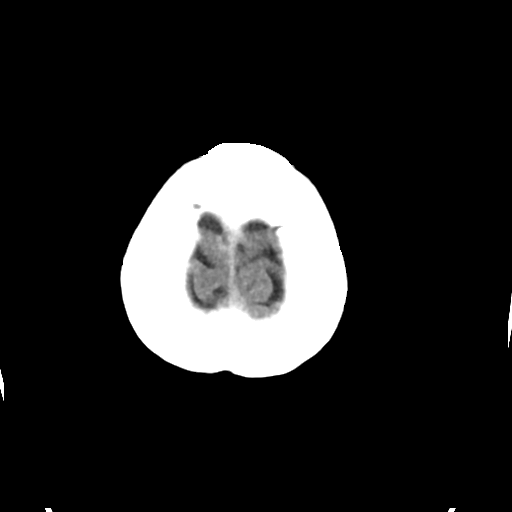

[Series 4: head bone · axial · 0.42mm/px · z∈[-80,-52]mm · 3 of 72 slices shown]
[im 8/72  bone]
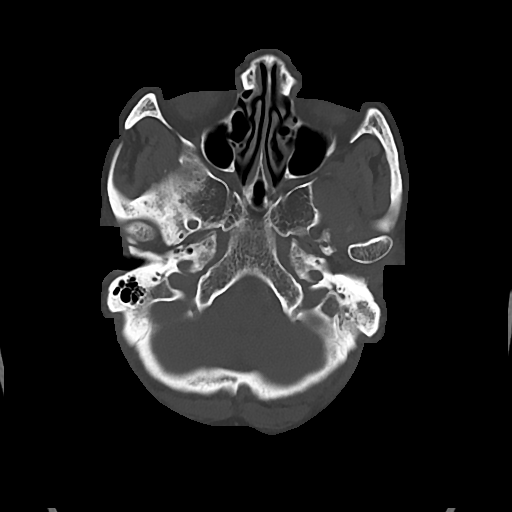
[im 15/72  bone]
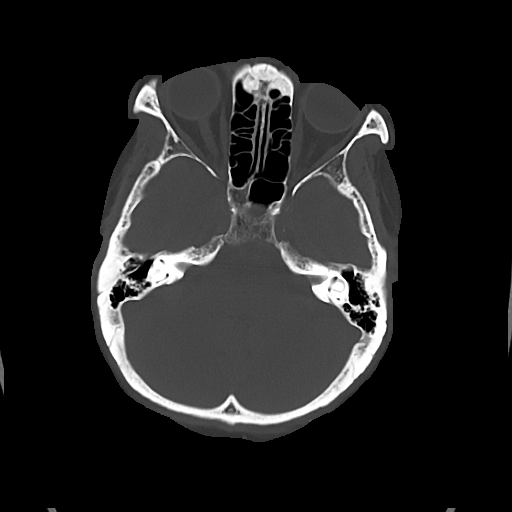
[im 22/72  bone]
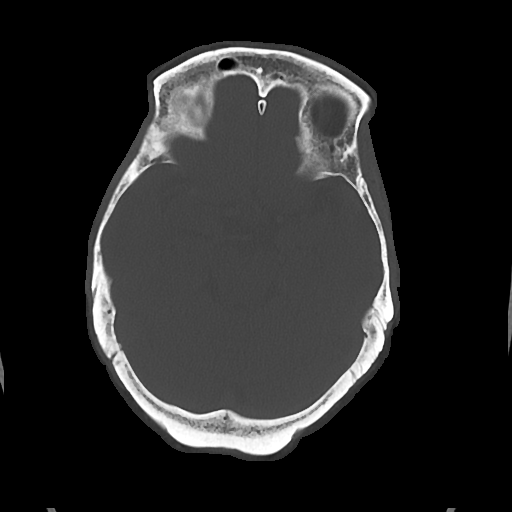

[Series 5: cor soft · coronal · 0.30mm/px · 3 of 67 slices shown]
[im 23/67  brain]
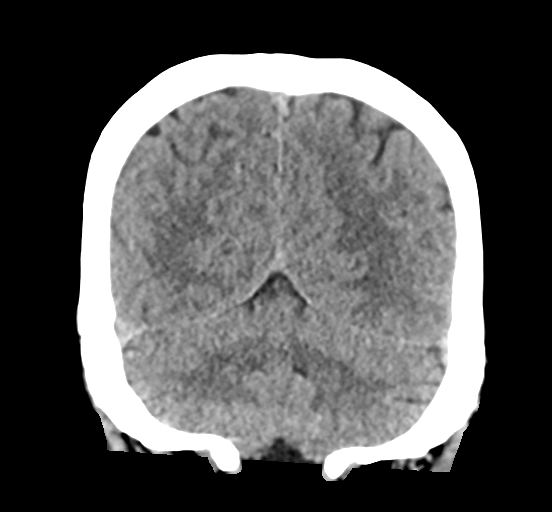
[im 30/67  brain]
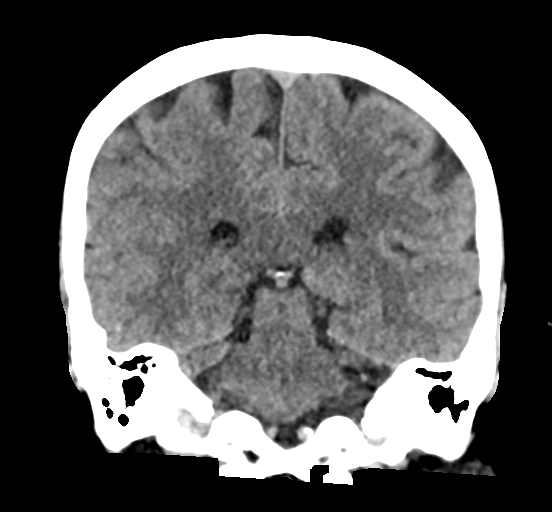
[im 37/67  brain]
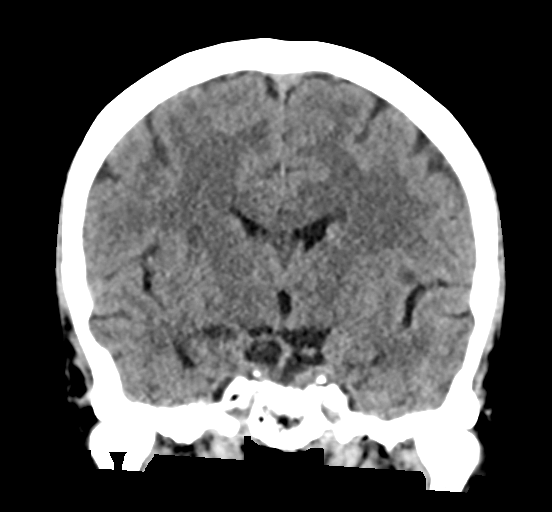

[Series 6: sag soft · sagittal · 0.28mm/px · 3 of 59 slices shown]
[im 20/59  brain]
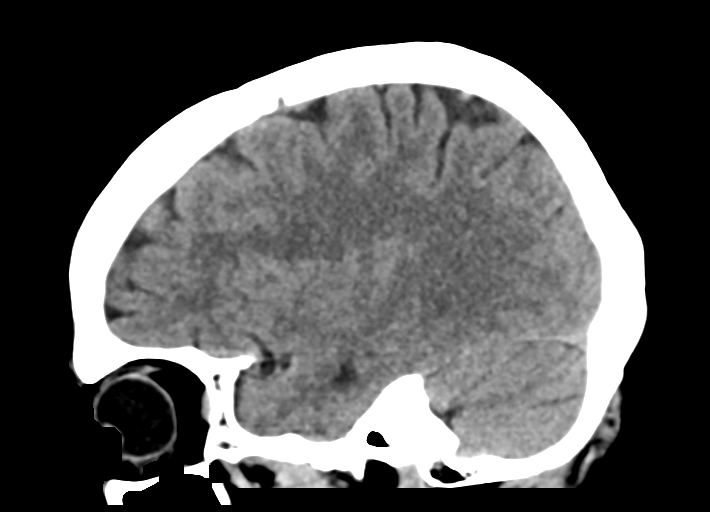
[im 30/59  brain]
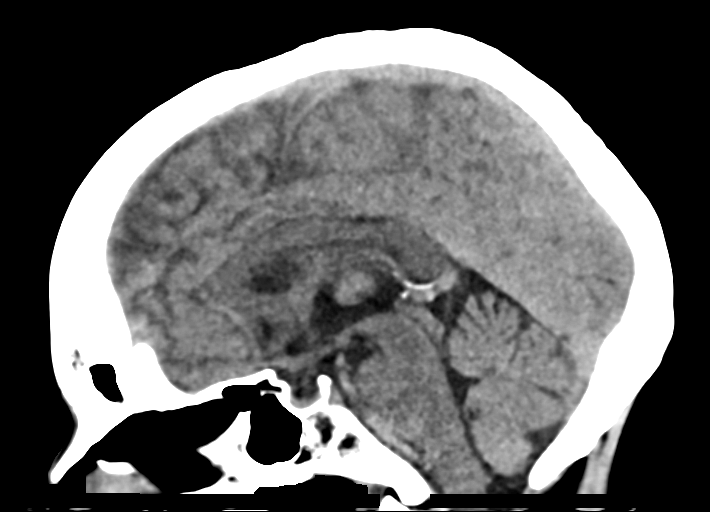
[im 39/59  brain]
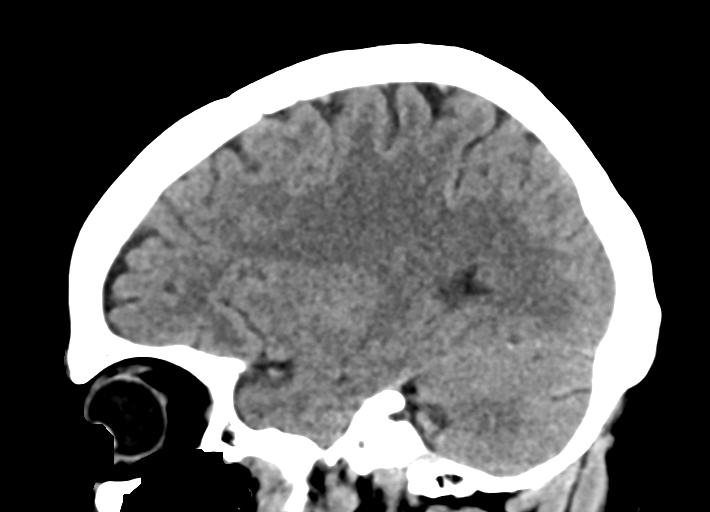

[16 of 47 positions shown; findings below may reference images not displayed]

FINDINGS: Brain: There is acute or subacute infarction in the region of the
right frontal white matter/caudate head. No evidence of hemorrhage
or mass effect. The remainder the brain appears unchanged with a few
old frontal white matter infarctions but no other focal finding. No
mass lesion, hydrocephalus or extra-axial collection.

Vascular: There is atherosclerotic calcification of the major
vessels at the base of the brain. Cannot rule [DATE] mm right MCA
aneurysm.

Skull: Negative

Sinuses/Orbits: Clear/normal

Other: None
IMPRESSION: Newly seen 1-2 cm region of low-density in the right frontal lobe in
the region of the deep white matter and caudate head consistent with
acute or subacute infarction. No hemorrhage or mass effect. Few
other old frontal white matter infarctions as seen previously.

Atherosclerotic change of the major vessels at base of the brain.
Question 5 mm right MCA region aneurysm.

## 2020-02-06 DIAGNOSIS — R718 Other abnormality of red blood cells: Secondary | ICD-10-CM | POA: Diagnosis not present

## 2020-02-06 DIAGNOSIS — G4700 Insomnia, unspecified: Secondary | ICD-10-CM | POA: Diagnosis not present

## 2020-02-06 DIAGNOSIS — E785 Hyperlipidemia, unspecified: Secondary | ICD-10-CM | POA: Diagnosis not present

## 2020-02-06 DIAGNOSIS — I10 Essential (primary) hypertension: Secondary | ICD-10-CM | POA: Diagnosis not present

## 2020-02-06 DIAGNOSIS — I6930 Unspecified sequelae of cerebral infarction: Secondary | ICD-10-CM | POA: Diagnosis not present

## 2020-02-06 DIAGNOSIS — R5382 Chronic fatigue, unspecified: Secondary | ICD-10-CM | POA: Diagnosis not present

## 2020-02-06 DIAGNOSIS — F0150 Vascular dementia without behavioral disturbance: Secondary | ICD-10-CM | POA: Diagnosis not present

## 2020-02-06 DIAGNOSIS — E440 Moderate protein-calorie malnutrition: Secondary | ICD-10-CM | POA: Diagnosis not present

## 2020-02-06 DIAGNOSIS — R7301 Impaired fasting glucose: Secondary | ICD-10-CM | POA: Diagnosis not present

## 2020-02-20 ENCOUNTER — Other Ambulatory Visit: Payer: Self-pay | Admitting: Adult Health

## 2020-02-20 ENCOUNTER — Other Ambulatory Visit: Payer: Self-pay | Admitting: Internal Medicine

## 2020-05-10 ENCOUNTER — Other Ambulatory Visit: Payer: Self-pay | Admitting: Internal Medicine

## 2020-06-21 ENCOUNTER — Other Ambulatory Visit: Payer: Self-pay | Admitting: Adult Health

## 2020-06-21 ENCOUNTER — Other Ambulatory Visit: Payer: Self-pay | Admitting: Internal Medicine

## 2020-07-30 ENCOUNTER — Other Ambulatory Visit: Payer: Self-pay

## 2020-07-30 ENCOUNTER — Emergency Department (HOSPITAL_COMMUNITY)
Admission: EM | Admit: 2020-07-30 | Discharge: 2020-07-31 | Disposition: A | Discharge status: Home / Self Care | Payer: Medicare Other | Attending: Emergency Medicine | Admitting: Emergency Medicine

## 2020-07-30 DIAGNOSIS — Z7982 Long term (current) use of aspirin: Secondary | ICD-10-CM | POA: Insufficient documentation

## 2020-07-30 DIAGNOSIS — Z79899 Other long term (current) drug therapy: Secondary | ICD-10-CM | POA: Insufficient documentation

## 2020-07-30 DIAGNOSIS — R413 Other amnesia: Secondary | ICD-10-CM | POA: Diagnosis not present

## 2020-07-30 DIAGNOSIS — I10 Essential (primary) hypertension: Secondary | ICD-10-CM | POA: Diagnosis not present

## 2020-07-30 DIAGNOSIS — R4182 Altered mental status, unspecified: Secondary | ICD-10-CM | POA: Diagnosis not present

## 2020-07-30 DIAGNOSIS — U071 COVID-19: Secondary | ICD-10-CM | POA: Diagnosis not present

## 2020-07-30 LAB — CBC WITH DIFFERENTIAL/PLATELET
Abs Immature Granulocytes: 0.03 10*3/uL (ref 0.00–0.07)
Basophils Absolute: 0 10*3/uL (ref 0.0–0.1)
Basophils Relative: 0 %
Eosinophils Absolute: 0 10*3/uL (ref 0.0–0.5)
Eosinophils Relative: 0 %
HCT: 39.7 % (ref 36.0–46.0)
Hemoglobin: 11.8 g/dL — ABNORMAL LOW (ref 12.0–15.0)
Immature Granulocytes: 0 %
Lymphocytes Relative: 7 %
Lymphs Abs: 0.6 10*3/uL — ABNORMAL LOW (ref 0.7–4.0)
MCH: 22.9 pg — ABNORMAL LOW (ref 26.0–34.0)
MCHC: 29.7 g/dL — ABNORMAL LOW (ref 30.0–36.0)
MCV: 76.9 fL — ABNORMAL LOW (ref 80.0–100.0)
Monocytes Absolute: 0.7 10*3/uL (ref 0.1–1.0)
Monocytes Relative: 8 %
Neutro Abs: 8.2 10*3/uL — ABNORMAL HIGH (ref 1.7–7.7)
Neutrophils Relative %: 85 %
Platelets: 216 10*3/uL (ref 150–400)
RBC: 5.16 MIL/uL — ABNORMAL HIGH (ref 3.87–5.11)
RDW: 13.9 % (ref 11.5–15.5)
WBC: 9.7 10*3/uL (ref 4.0–10.5)
nRBC: 0 % (ref 0.0–0.2)

## 2020-07-30 LAB — URINALYSIS, ROUTINE W REFLEX MICROSCOPIC
Bilirubin Urine: NEGATIVE
Glucose, UA: NEGATIVE mg/dL
Hgb urine dipstick: NEGATIVE
Ketones, ur: 20 mg/dL — AB
Leukocytes,Ua: NEGATIVE
Nitrite: NEGATIVE
Protein, ur: 30 mg/dL — AB
Specific Gravity, Urine: 1.025 (ref 1.005–1.030)
pH: 5 (ref 5.0–8.0)

## 2020-07-30 LAB — COMPREHENSIVE METABOLIC PANEL
ALT: 17 U/L (ref 0–44)
AST: 21 U/L (ref 15–41)
Albumin: 3.7 g/dL (ref 3.5–5.0)
Alkaline Phosphatase: 60 U/L (ref 38–126)
Anion gap: 8 (ref 5–15)
BUN: 15 mg/dL (ref 8–23)
CO2: 29 mmol/L (ref 22–32)
Calcium: 8.9 mg/dL (ref 8.9–10.3)
Chloride: 102 mmol/L (ref 98–111)
Creatinine, Ser: 0.86 mg/dL (ref 0.44–1.00)
GFR, Estimated: >60 mL/min (ref >60)
Glucose, Bld: 136 mg/dL — ABNORMAL HIGH (ref 70–99)
Potassium: 3.9 mmol/L (ref 3.5–5.1)
Sodium: 139 mmol/L (ref 135–145)
Total Bilirubin: 0.6 mg/dL (ref 0.3–1.2)
Total Protein: 7.2 g/dL (ref 6.5–8.1)

## 2020-07-30 LAB — RESP PANEL BY RT-PCR (FLU A&B, COVID) ARPGX2
Influenza A by PCR: NEGATIVE
Influenza B by PCR: NEGATIVE
SARS Coronavirus 2 by RT PCR: POSITIVE — AB

## 2020-07-30 NOTE — ED Provider Notes (Signed)
Emergency Medicine Provider Triage Evaluation Note  Sherry Fitzgerald , a 75 y.o. female  was evaluated in triage.  Pt complains of forgetful.  She states her husband brought her here she does not know why she is here.  She denies being in any pain.  She denies any past medical history or daily medications..  Review of Systems  Positive: None Negative: Fever, chills, chest pain, abdominal pain, nausea, vomiting, headache  Physical Exam  BP (!) 148/69   Pulse 85   Temp (!) 100.5 F (38.1 C) (Oral)   Resp 16   SpO2 96%  Gen:   Awake, no distress   Resp:  Normal effort  MSK:   Moves extremities without difficulty  Other:  Alert to self only.  Speech is clear.  No facial droop.  Strong equal grip strength in all extremities.  No focal weakness.  Medical Decision Making  Medically screening exam initiated at 4:27 PM.  Appropriate orders placed.  Anahlia Iseminger was informed that the remainder of the evaluation will be completed by another provider, this initial triage assessment does not replace that evaluation, and the importance of remaining in the ED until their evaluation is complete.  Patient has non focal neuro exam. RN called her husband who will wait with her in the lobby because of the confusion. Basic labs and covid swb ordered.   Portions of this note were generated with Scientist, clinical (histocompatibility and immunogenetics). Dictation errors may occur despite best attempts at proofreading.    Shanon Ace, PA-C 07/30/20 1635    Mancel Bale, MD 07/30/20 [phone]

## 2020-07-30 NOTE — ED Triage Notes (Signed)
Pt states that she is sometimes "forgetful," & that she often "forgets where she's at." Pt denies falling, denies injury or other causation. Denies CV, Resp hx, states she has hx HTN, ran out of medicine approx 52mo ago. States she just wants to figure out cause of confusion.

## 2020-07-30 NOTE — ED Notes (Signed)
Husband to sit w pt in lobby per PA

## 2020-07-30 NOTE — ED Notes (Signed)
Pt chart shows hx CVA, HTN, pt states unaware of both. Grip strength equil, no droop, drift noted, CMS intact all 4 extremities

## 2020-07-31 ENCOUNTER — Emergency Department (HOSPITAL_COMMUNITY): Payer: Medicare Other

## 2020-07-31 DIAGNOSIS — U071 COVID-19: Secondary | ICD-10-CM | POA: Diagnosis not present

## 2020-07-31 DIAGNOSIS — R4182 Altered mental status, unspecified: Secondary | ICD-10-CM | POA: Diagnosis not present

## 2020-07-31 MED ORDER — MOLNUPIRAVIR EUA 200MG CAPSULE: 4.0000 | ORAL_CAPSULE | Freq: Two times a day (BID) | ORAL | 0 refills | Status: AC

## 2020-07-31 MED ORDER — ACETAMINOPHEN 325 MG PO TABS
650.0000 mg | ORAL_TABLET | Freq: Once | ORAL | Status: AC
  Administered 2020-07-31: 650 mg via ORAL
  Filled 2020-07-31: qty 2

## 2020-07-31 NOTE — ED Provider Notes (Signed)
[facility] Provider [facility]  CSN: [id number] Arrival date & time: 07/30/20 1511  Chief Complaint(s) Memory Loss (Covid+)  HPI Sherry Fitzgerald is a 75 y.o. female with a past medical history listed below including prior stroke with deficit has memory loss here for worsened memory noted this morning.  Patient's husband is here providing history.  He reports that the patient had 2 falls today neither which resulted in head trauma.  Reports that has been having URI symptoms for the past 2 days.  Had 1 episode of emesis while in the waiting room.  No noted fevers.  No known sick contacts.  Patient is not complaining of any chest pain or shortness of breath.  No abdominal pain.  No diarrhea.  No urinary symptoms.  HPI  Past Medical History Past Medical History:  Diagnosis Date  . Cerebrovascular accident (CVA) (HCC)   . Hypertension    no medications for years   Patient Active Problem List   Diagnosis Date Noted  . Long term (current) use of anticoagulants 09/23/2017  . CVA (cerebral vascular accident) (HCC) 09/15/2017  . Hyperglycemia 09/15/2017  . Health care maintenance 07/30/2014  . Chest wall pain 07/27/2012  . Right leg weakness 07/28/2011  . Cough 02/09/2011  . Knee pain, bilateral 01/21/2011  . Right cervical radiculopathy at C5 11/12/2010  . Left lumbar radiculopathy 10/27/2010  . Mixed hyperlipidemia 03/04/2006  . Essential hypertension 03/04/2006   Home Medication(s) Prior to Admission medications   Medication Sig Start Date End Date Taking? Authorizing Provider  molnupiravir EUA 200 mg CAPS Take 4 capsules (800 mg total) by mouth 2 (two) times daily for 5 days. 07/31/20 08/05/20 Yes Precilla Purnell, Amadeo GarnetPedro Eduardo, MD  aspirin 325 MG tablet Take 325 mg by mouth daily.    [provider]  atorvastatin (LIPITOR) 80 MG tablet Take 1 tablet (80 mg total) by mouth daily. 11/08/17   Myrene BuddyFletcher, Jacob, MD  losartan (COZAAR) 100 MG tablet TAKE 1  TABLET (100 MG TOTAL) BY MOUTH DAILY. NEED OFFICE VISIT 06/21/20   Chrystie NoseHilty, Kenneth C, MD  memantine (NAMENDA) 10 MG tablet TAKE 1 TABLET BY MOUTH TWICE A DAY 02/20/20   Ihor AustinMcCue, Jessica, NP                                                                                                                                    Past Surgical History Past Surgical History:  Procedure Laterality Date  . CHOLECYSTECTOMY  2014   Family History Family History  Problem Relation Age of Onset  . CVA Mother   . Heart disease Father [id number]  . CVA Father     Social History Social History   Tobacco Use  . Smoking status: Never  . Smokeless tobacco: Never  Substance Use Topics  . Alcohol use: Never  . Drug use: Never   Allergies Patient has no known allergies.  Review of  Systems Review of Systems All other systems are reviewed and are negative for acute change except as noted in the HPI  Physical Exam Vital Signs  I have reviewed the triage vital signs BP (!) 152/62 (BP Location: Right Arm)   Pulse 64   Temp 99.6 F (37.6 C) (Oral)   Resp 20   SpO2 97%   Physical Exam Vitals reviewed.  Constitutional:      General: She is not in acute distress.    Appearance: She is well-developed. She is not diaphoretic.  HENT:     Head: Normocephalic and atraumatic.     Nose: Nose normal.  Eyes:     General: No scleral icterus.       Right eye: No discharge.        Left eye: No discharge.     Conjunctiva/sclera: Conjunctivae normal.     Pupils: Pupils are equal, round, and reactive to light.  Cardiovascular:     Rate and Rhythm: Normal rate and regular rhythm.     Heart sounds: No murmur heard.   No friction rub. No gallop.  Pulmonary:     Effort: Pulmonary effort is normal. No respiratory distress.     Breath sounds: Normal breath sounds. No stridor. No rales.  Abdominal:     General: There is no distension.     Palpations: Abdomen is soft.     Tenderness: There is no abdominal tenderness.   Musculoskeletal:        General: No tenderness.     Cervical back: Normal range of motion and neck supple.  Skin:    General: Skin is warm and dry.     Findings: No erythema or rash.  Neurological:     Mental Status: She is alert and oriented to person, place, and time.     Comments: Mental Status:  Alert and oriented to person Attention and concentration normal.  Speech clear.  Recent memory is impaired  Cranial Nerves:  II Visual Fields: Intact to confrontation. Visual fields intact. III, IV, VI: Pupils equal and reactive to light and near. Full eye movement without nystagmus  V Facial Sensation: Normal. No weakness of masticatory muscles  VII: No facial weakness or asymmetry  VIII Auditory Acuity: Grossly normal  IX/X: The uvula is midline; the palate elevates symmetrically  XI: Normal sternocleidomastoid and trapezius strength  XII: The tongue is midline. No atrophy or fasciculations.   Motor System: Muscle Strength: 5/5 and symmetric in the upper and lower extremities. No pronation or drift.  Muscle Tone: Tone and muscle bulk are normal in the upper and lower extremities.  Reflexes: DTRs: 1+ and symmetrical in all four extremities. No Clonus Coordination: No tremor.  Sensation: Intact to light touch,  Gait: deferred     ED Results and Treatments Labs (all labs ordered are listed, but only abnormal results are displayed) Labs Reviewed  RESP PANEL BY RT-PCR (FLU A&B, COVID) ARPGX2 - Abnormal; Notable for the following components:      Result Value   SARS Coronavirus 2 by RT PCR POSITIVE (*)    All other components within normal limits  CBC WITH DIFFERENTIAL/PLATELET - Abnormal; Notable for the following components:   RBC 5.16 (*)    Hemoglobin 11.8 (*)    MCV 76.9 (*)    MCH 22.9 (*)    MCHC 29.7 (*)    Neutro Abs 8.2 (*)    Lymphs Abs 0.6 (*)    All other components within normal limits  COMPREHENSIVE METABOLIC PANEL - Abnormal; Notable for the following  components:   Glucose, Bld 136 (*)    All other components within normal limits  URINALYSIS, ROUTINE W REFLEX MICROSCOPIC - Abnormal; Notable for the following components:   Ketones, ur 20 (*)    Protein, ur 30 (*)    Bacteria, UA RARE (*)    All other components within normal limits                                                                                                                         EKG  EKG Interpretation  Date/Time:  Tuesday July 30 2020 15:36:04 EDT Ventricular Rate:  96 PR Interval:    QRS Duration: 80 QT Interval:  354 QTC Calculation: 447 R Axis:   70 Text Interpretation: Sinus rhythm Sinus arrhythmia Septal infarct , age undetermined Abnormal ECG Since last tracing rate faster Otherwise no significant change Confirmed by Mancel Bale [phone]) on 07/30/2020 5:52:19 PM       Radiology CT Head Wo Contrast  Result Date: 07/31/2020 CLINICAL DATA:  Altered mental status. EXAM: CT HEAD WITHOUT CONTRAST TECHNIQUE: Contiguous axial images were obtained from the base of the skull through the vertex without intravenous contrast. COMPARISON:  September 15, 2017 FINDINGS: Brain: No evidence of acute infarction, hemorrhage, hydrocephalus, extra-axial collection or mass lesion/mass effect. A small chronic infarct is seen along the right caudate head and anterior aspect of the external capsule on the right. Vascular: No hyperdense vessel or unexpected calcification. Skull: Normal. Negative for fracture or focal lesion. Sinuses/Orbits: No acute finding. Other: None. IMPRESSION: 1. No acute intracranial abnormality. 2. Chronic right frontal lobe infarct. Electronically Signed   By: Aram Candela M.D.   On: 07/31/2020 03:58    Pertinent labs & imaging results that were available during my care of the patient were reviewed by me and considered in my medical decision making (see chart for details).  Medications Ordered in ED Medications  acetaminophen (TYLENOL) tablet 650 mg  (650 mg Oral Given 07/31/20 0206)                                                                                                                                    Procedures Procedures  (including critical care time)  Medical Decision Making / ED Course I have reviewed the nursing notes for this encounter and the patient's prior records (if available  in EHR or on provided paperwork).   Viviann Broyles was evaluated in Emergency Department on 07/31/2020 for the symptoms described in the history of present illness. She was evaluated in the context of the global COVID-19 pandemic, which necessitated consideration that the patient might be at risk for infection with the SARS-CoV-2 virus that causes COVID-19. Institutional protocols and algorithms that pertain to the evaluation of patients at risk for COVID-19 are in a state of rapid change based on information released by regulatory bodies including the CDC and federal and state organizations. These policies and algorithms were followed during the patient's care in the ED.  CT head negative for ICH. Work-up notable for COVID-19. No significant electrolyte derangements or renal insufficiency. Patient provided with Tylenol which improved her temperature. Per husband patient seems to be improved. Will DC with Molnupiravir     Final Clinical Impression(s) / ED Diagnoses Final diagnoses:  COVID-19 virus infection  Memory deficit   The patient appears reasonably screened and/or stabilized for discharge and I doubt any other medical condition or other [facility] requiring further screening, evaluation, or treatment in the ED at this time prior to discharge. Safe for discharge with strict return precautions.  Disposition: Discharge  Condition: Good  I have discussed the results, Dx and Tx plan with the patient/family who expressed understanding and agree(s) with the plan. Discharge instructions discussed at length. The patient/family was given strict  return precautions who verbalized understanding of the instructions. No further questions at time of discharge.    ED Discharge Orders          Ordered    molnupiravir EUA 200 mg CAPS  2 times daily        07/31/20 0431            Follow Up: Jarome Matin, MD [address] [place] [address] [phone]  Call  as needed      This chart was dictated using voice recognition software.  Despite best efforts to proofread,  errors can occur which can change the documentation meaning.    Nira Conn, MD 07/31/20 [phone]

## 2020-07-31 NOTE — ED Notes (Signed)
Pt to CT

## 2020-07-31 NOTE — ED Notes (Signed)
Patient transported to CT scan . 

## 2020-11-06 ENCOUNTER — Other Ambulatory Visit: Payer: Self-pay | Admitting: Adult Health

## 2020-11-13 DIAGNOSIS — Z23 Encounter for immunization: Secondary | ICD-10-CM | POA: Diagnosis not present

## 2020-12-06 DIAGNOSIS — E785 Hyperlipidemia, unspecified: Secondary | ICD-10-CM | POA: Diagnosis not present

## 2020-12-06 DIAGNOSIS — I10 Essential (primary) hypertension: Secondary | ICD-10-CM | POA: Diagnosis not present

## 2020-12-06 DIAGNOSIS — R7301 Impaired fasting glucose: Secondary | ICD-10-CM | POA: Diagnosis not present

## 2020-12-09 ENCOUNTER — Ambulatory Visit (INDEPENDENT_AMBULATORY_CARE_PROVIDER_SITE_OTHER): Payer: Medicare Other | Admitting: Adult Health

## 2020-12-09 ENCOUNTER — Other Ambulatory Visit: Payer: Self-pay

## 2020-12-09 ENCOUNTER — Encounter: Payer: Self-pay | Admitting: Adult Health

## 2020-12-09 VITALS — BP 167/76 | HR 51 | Ht <= 58 in | Wt 115.0 lb

## 2020-12-09 DIAGNOSIS — F0150 Vascular dementia without behavioral disturbance: Secondary | ICD-10-CM

## 2020-12-09 DIAGNOSIS — Z8673 Personal history of transient ischemic attack (TIA), and cerebral infarction without residual deficits: Secondary | ICD-10-CM

## 2020-12-09 MED ORDER — MEMANTINE HCL 10 MG PO TABS: 10.0000 mg | ORAL_TABLET | Freq: Two times a day (BID) | ORAL | 3 refills | Status: DC

## 2020-12-09 NOTE — Patient Instructions (Signed)
Continue Namenda 10mg  twice daily - refill provided   Continue to try to encourage routine memory exercises such as cross word puzzles, word search, jigsaw puzzles and reading   Continue aspirin 325 mg daily  and atorvastatin 80mg  daily  for secondary stroke prevention  Continue to follow up with PCP regarding cholesterol and blood pressure management  Maintain strict control of hypertension with blood pressure goal below 130/90 and cholesterol with LDL cholesterol (bad cholesterol) goal below 70 mg/dL      Followup in the future with me in 1 year or call earlier if needed       Thank you for coming to see at [facility]. I hope we have been able to provide you high quality care today.  You may receive a patient satisfaction survey over the next few weeks. We would appreciate your feedback and comments so that we may continue to improve ourselves and the health of our patients.

## 2020-12-09 NOTE — Progress Notes (Signed)
[facility] [address] [place]. [place] [address] [phone]       OFFICE FOLLOW UP VISIT NOTE  [name] Date of Birth:  10-03-1945 Medical Record Number:  [id number]   Referring MD:  Rosalin Hawking Reason for Referral: Stroke follow-up   Chief complaint: Chief Complaint  Patient presents with   Follow-up    Rm 3 with spouse arles  Pt is well and stable on stroke standpoint Spouse states memory is worsening day by day        HPI:  Update 12/09/2020 JM: Returns for 1 year follow-up for history of stroke with residual cognitive impairment. She is accompanied by her husband  Overall stable since prior visit -denies new stroke/TIA symptoms Per husband, cognition continues to slowly decline more so short-term memory MMSE today 14/30 (prior 12/30) Remains on Namenda 10 mg twice daily -denies side effects.  Husband does notice if she misses 1 dose as she will be more agitated and worsening cognition Denies hallucinations, paranoia, delusions or agitation/aggression.  Sleeps well.  Good appetite. Able to maintain majority of ADLs independently but husband continues to provide 24/7 care and supervision He continues to try to keep her mentally stimulated -will have difficulty at times persuading her to do memory exercises such as games on her iPad as she will become easily frustrated due to difficulty operating iPad. She does not do any routine exercise but husband tries to keep her physically active  Compliant on aspirin and atorvastatin -denies side effects Blood pressure today 167/96 - known elevation at OV -monitors at home and typically ranging 150s/60s  Completed labs 12/2 at PCP office (unable to personally view via epic) - has PCP f/u 12/9  No new concerns at this time    History provided for reference purposes only Update 12/07/2019 JM: [name] returns for 66-month stroke follow-up accompanied by her husband.  Cognitive impairment post  stroke continues to slowly decline.  MMSE today 12/30 (prior 14/30).  Remains on Namenda with continued benefit subjectively and denies side effects.  He does report occasional perseveration but denies any other behavioral concerns.  Husband provides 24/7 care and supervision with patient continuing to be active as able.  Denies new stroke/TIA symptoms.  On aspirin 325 mg daily and atorvastatin 80 mg daily without side effects.  Recent lipid panel showed LDL 69.  Blood pressure today 164/69 (typically elevated at appointments). Stable at home per husband.  No further concerns at this time.  Update 06/06/2019 JM: [name] returns for stroke follow-up accompanied by her husband.  Residual deficits of cognitive impairment with husband reporting slow decline since prior visit greater with short-term memory, concentration and taking longer to perform normal activities.  He does endorse occasional wandering but no other behavioral concerns.  No evidence of depression or anxiety.  She does require assistance for IADLs and minimal assistance for ADLs.  MMSE today 14/30 (prior 13/30).  Continues on Namenda 10 mg twice daily with ongoing benefit and denies side effects.  Husband does notice a difference if she misses Namenda dose.  Continues on aspirin 325 mg daily and atorvastatin for secondary stroke prevention.  Lipid panel obtained 1 year prior satisfactory.  No recent lab work.  Blood pressure today elevated at 163/63, asymptomatic.  Blood pressure not routinely monitored at home.  No concerns at this time.  Update 12/06/2018 JM: [name] is a 75 year old female who is being seen today for stroke follow-up accompanied by  her husband.  Residual deficits include cognitive impairment.  Was initiated on Namenda at prior visit.  She has continued on Namenda 10 mg daily tolerating well without side effects.  Memory has been overall stable.  Per husband, she did miss a couple doses of Namenda and he did notice a great  difference in her cognition.  He tries to encourage her to do memory exercises but she does not do them routinely.  He continues to provide 24/7 care and supervision due to cognitive impairment and safety concerns.  Continues on aspirin 325 mg daily and atorvastatin 80 mg daily for secondary stroke prevention without side effects.  Blood pressure today elevated initially and on recheck 150/80.  Monitor his levels at home which has been stable.  She was evaluated by Dr. Vickey Huger on 03/08/2018 for possible sleep apnea and recommended undergoing sleep testing but has not done so at this time as office has attempted to call twice but unable to contact.  No further concerns at this time.  Update 01/18/2018 Dr. Pearlean Brownie : She returns for follow-up after last visit 2 months ago.  She is accompanied by her husband.  She continues to have cognitive impairment and memory difficulties which are not improving.  She has very poor short-term memory.  She cannot be left alone.  She needs constant reminders and supervision.  She is unable to do things that she used to do in the past and husband needs to keep a close eye on her.  She did undergo lab work for reversible causes of cognitive impairment on 11/10/2017 and vitamin B12, TSH and RPR were all normal.  EEG done on 12/07/2017 was also normal.  CT angiogram of the brain and neck done on 11/19/2017 showed no significant for current intra-and extracranial stenosis and the previously seen right carotid thrombus had completely resolved.  Patient has poor appetite and has weight loss and hence would not be a good candidate for Aricept.  She has not tried [place] yet.  She is tolerating aspirin well without any bleeding or bruising.  She has stopped Coumadin after his CT Angie results showed resolution of the clot.  Her blood pressure is well controlled and today it is 121/6 3.  The patient was having a dry cough and her primary physician Dr. Jarold Motto plans to change lisinopril to  alternative blood pressure medicine.  She remains on Lipitor which is tolerating well without any side effects.  She has not had any recurrent stroke or TIA symptoms.  Initial visit 11/10/2017 Dr. Pearlean Brownie; [name] is a pleasant 75 year old [place] lady who is seen today for initial office consultation visit for stroke.  She is accompanied by her husband.  History is obtained from them and review of electronic medical records.  I have personally reviewed imaging films. HPI ( Dr Wilford Corner ) Sherry Fitzgerald is an 75 y.o. female with a PMH of HTN who presented to the ED with a 2 day history of speech changes, AMS and left face weakness.Per patient family member these symptoms have been intermittent for weeks, but for the past 2 days the patient has had increasing confusion. She is doing odd things like attempting to make coffee w/o coffee grounds. She is becoming more easily agitated and argues about the time. 2 days ago he noticed that the left side of her face seemed different and that she was not speaking as clearly. Denies any ETOH, or drug abuse. Denies any CP or SOB. Due  to the persistence of symptoms and then becoming more frequent as well as the facial droop, there has been decided to bring the patient to the emergency room for evaluation.Noncontrast CT of the head was done that showed a hypodensity suggestive of subacute infarct in the right basal ganglia/caudate.Neurological consultation was obtained for further evaluation. .LKW: 2-4 day ago; uncertain of time NIHSS: 3 MRI scan of the brain showed right frontal deep white matter infarct extending into the head of the caudate with scattered peripheral cortical right frontal infarcts.  MRI of the brain showed a 5 mm distal right MCA aneurysm.  2D echo showed normal ejection fraction without cardiac source of embolus.  LDL cholesterol was elevated at 180 mg percent.  Hemoglobin A1c was 6.1.  CT angiogram of the head and neck showed right common  carotid artery thrombus which was nonocclusive.  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  Patient was felt to have embolized distally from the right common carotid artery thrombus and hence was started on anticoagulation with warfarin and on Lipitor for his elevated lipids.  Patient is currently living at home with her husband.  She has significant cognitive impairment with short-term memory being very poor.  She is also quite slow to process information and has trouble with multitasking.  She requires supervision for activities like cooking and working in the kitchen which she used to love.  She has left the burners on several times while cooking now.  She is tolerating warfarin well without bleeding or bruising and INR has been fairly stable.  Her facial droop and speech appear to have improved.  Patient's husband is wondering when her cognitive difficulties are going to improve and whether she will be able to become fully independent.  She has no prior history of cognitive impairment or memory difficulties.  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     ROS:   14 system review of systems is positive for memory loss, confusion and all other systems negative   PMH:  Past Medical History:  Diagnosis Date   Cerebrovascular accident (CVA) ([place])    Hypertension    no medications for years    Social History:  Social History   Socioeconomic History   Marital status: Married    Spouse name: Not on file   Number of children: Not on file   Years of education: Not on file   Highest education level: Not on file  Occupational History   Occupation: retired  Tobacco Use   Smoking status: Never   Smokeless tobacco: Never  Substance and Sexual Activity   Alcohol use: Never   Drug use: Never   Sexual activity: Not on file  Other Topics Concern   Not on file  Social History Narrative   Former Advertising copywriter.  Lives alone, but has 5  children in the area who are grown and supportive.  +Financial difficulties - trouble paying bills.  She has been married to her current husband for 3 years.   Social Determinants of Health   Financial Resource Strain: Not on file  Food Insecurity: Not on file  Transportation Needs: Not on file  Physical Activity: Not on file  Stress: Not on file  Social Connections: Not on file  Intimate Partner Violence: Not on file    Medications:   Current Outpatient Medications on File Prior to Visit  Medication Sig Dispense Refill   aspirin 325 MG tablet Take  325 mg by mouth daily.     atorvastatin (LIPITOR) 80 MG tablet Take 1 tablet (80 mg total) by mouth daily. 90 tablet 0   losartan (COZAAR) 100 MG tablet TAKE 1 TABLET (100 MG TOTAL) BY MOUTH DAILY. NEED OFFICE VISIT 15 tablet 0   memantine (NAMENDA) 10 MG tablet TAKE 1 TABLET BY MOUTH TWICE A DAY 180 tablet 1   No current facility-administered medications on file prior to visit.    Allergies:  No Known Allergies  Physical Exam  Today's Vitals   12/09/20 0828  BP: (!) 167/76  Pulse: (!) 51  Weight: 115 lb (52.2 kg)  Height: 4\' 8"  (1.422 m)    Body mass index is 25.78 kg/m.  General: petite frail [place] lady, seated, in no evident distress Head: head normocephalic and atraumatic.   Neck: supple with no carotid or supraclavicular bruits Cardiovascular: regular rate and rhythm, no murmurs Musculoskeletal: no deformity Skin:  no rash/petichiae Vascular:  Normal pulses all extremities  Neurologic Exam Mental Status: Awake and fully alert. No evidence of aphasia or dysarthria.  Follows commands without great difficulty.  Mood and affect appropriate. MMSE - Mini Mental State Exam 12/09/2020 12/07/2019 06/07/2019  Orientation to time 1 0 0  Orientation to Place 4 4 2   Registration 3 0 3  Attention/ Calculation 0 0 1  Recall 0 3 0  Language- name 2 objects 1 2 2   Language- repeat 0 0 1  Language- follow 3 step command 3 2 3    Language- read & follow direction 1 1 1   Write a sentence 1 0 0  Copy design 0 0 1  Total score 14 12 14    Cranial Nerves: Pupils equal, briskly reactive to light. Extraocular movements full without nystagmus. Visual fields full to confrontation. Hearing intact. Facial sensation intact. Face, tongue, palate moves normally and symmetrically.  Motor: Normal bulk and tone. Normal strength in all tested extremity muscles.   Sensory.: intact to touch , pinprick , position and vibratory sensation.  Coordination: Rapid alternating movements normal in all extremities. Finger-to-nose and heel-to-shin performed accurately bilaterally. Gait and Station: Arises from chair without difficulty. Stance is normal. Gait demonstrates normal stride length and balance without use of assistive device Reflexes: 1+ and symmetric. Toes downgoing.       ASSESSMENT/PLAN: 75 year old [place] lady with right MCA branch infarct in September 2019 secondary to thromboembolism from right carotid thrombus with significant residual vascular cognitive impairment.  Vascular risk factors of hypertension, carotid thrombus and hyperlipidemia.      R MCA stroke :  Residual deficit: Cognitive impairment (see #2).  Continue aspirin 325 mg daily  and atorvastatin 80 mg daily for secondary stroke prevention.  Managed by PCP Discussed secondary stroke prevention measures and importance of close PCP f/u for aggressive stroke risk factor management including HTN with BP goal<130/90 and HLD with LDL goal<70 Vascular dementia w/o behaviors:  Subjectively, cognition continues to decline more so short-term memory MMSE today 14/30 (prior 12/30) continue Namenda 10 mg twice daily -refill provided. husband not interested in any other type of medication such as Aricept especially as unknown benefit in vascular dementia Highly encourage memory exercises such as puzzles, reading and card games as well as routine exercise, socialization,  healthy diet and adequate sleep as well as importance of managing stroke risk factors    Follow-up in 1 year or call earlier if needed   CC:  Leanna Battles, MD    I spent 34 minutes of face-to-face and  non-face-to-face time with patient and husband.  This included previsit chart review, lab review, study review, order entry, electronic health record documentation, patient and husband education and discussion regarding history of stroke, residual cognitive impairment likely vascular dementia and ongoing medication management, completion and review of MMSE, importance of managing stroke risk factors and answered all other questions to patient and husband satisfaction  Frann Rider, AGNP-BC  [facility] [address] [place], [place] [address]  Phone [phone] Fax [phone] Note: This document was prepared with digital dictation and possible smart phrase technology. Any transcriptional errors that result from this process are unintentional.

## 2020-12-13 DIAGNOSIS — D649 Anemia, unspecified: Secondary | ICD-10-CM | POA: Diagnosis not present

## 2020-12-13 DIAGNOSIS — F01C0 Vascular dementia, severe, without behavioral disturbance, psychotic disturbance, mood disturbance, and anxiety: Secondary | ICD-10-CM | POA: Diagnosis not present

## 2020-12-13 DIAGNOSIS — I10 Essential (primary) hypertension: Secondary | ICD-10-CM | POA: Diagnosis not present

## 2020-12-13 DIAGNOSIS — Z0000 Encounter for general adult medical examination without abnormal findings: Secondary | ICD-10-CM | POA: Diagnosis not present

## 2020-12-13 DIAGNOSIS — Z13828 Encounter for screening for other musculoskeletal disorder: Secondary | ICD-10-CM | POA: Diagnosis not present

## 2020-12-13 DIAGNOSIS — Z1339 Encounter for screening examination for other mental health and behavioral disorders: Secondary | ICD-10-CM | POA: Diagnosis not present

## 2020-12-13 DIAGNOSIS — E785 Hyperlipidemia, unspecified: Secondary | ICD-10-CM | POA: Diagnosis not present

## 2020-12-13 DIAGNOSIS — R82998 Other abnormal findings in urine: Secondary | ICD-10-CM | POA: Diagnosis not present

## 2020-12-13 DIAGNOSIS — G4700 Insomnia, unspecified: Secondary | ICD-10-CM | POA: Diagnosis not present

## 2020-12-13 DIAGNOSIS — Z1331 Encounter for screening for depression: Secondary | ICD-10-CM | POA: Diagnosis not present

## 2020-12-13 DIAGNOSIS — I6930 Unspecified sequelae of cerebral infarction: Secondary | ICD-10-CM | POA: Diagnosis not present

## 2020-12-13 DIAGNOSIS — R718 Other abnormality of red blood cells: Secondary | ICD-10-CM | POA: Diagnosis not present

## 2021-03-04 ENCOUNTER — Telehealth: Payer: Self-pay | Admitting: *Deleted

## 2021-03-04 NOTE — Telephone Encounter (Signed)
[employer] PA, key [id number]. Approved, Coverage Start Date:02/02/2021; Coverage End Date:03/04/2022, approval faxed to pharmacy.

## 2021-03-30 ENCOUNTER — Other Ambulatory Visit: Payer: Self-pay | Admitting: Adult Health

## 2021-04-30 ENCOUNTER — Other Ambulatory Visit: Payer: Self-pay | Admitting: Adult Health

## 2021-06-24 DIAGNOSIS — H2513 Age-related nuclear cataract, bilateral: Secondary | ICD-10-CM | POA: Diagnosis not present

## 2021-07-01 ENCOUNTER — Other Ambulatory Visit: Payer: Self-pay | Admitting: Adult Health

## 2021-07-31 ENCOUNTER — Other Ambulatory Visit: Payer: Self-pay | Admitting: Adult Health

## 2021-08-03 ENCOUNTER — Other Ambulatory Visit: Payer: Self-pay

## 2021-08-03 ENCOUNTER — Emergency Department (HOSPITAL_BASED_OUTPATIENT_CLINIC_OR_DEPARTMENT_OTHER): Payer: Medicare Other

## 2021-08-03 ENCOUNTER — Encounter (HOSPITAL_BASED_OUTPATIENT_CLINIC_OR_DEPARTMENT_OTHER): Payer: Self-pay | Admitting: Student in an Organized Health Care Education/Training Program

## 2021-08-03 ENCOUNTER — Inpatient Hospital Stay (HOSPITAL_BASED_OUTPATIENT_CLINIC_OR_DEPARTMENT_OTHER)
Admission: EM | Admit: 2021-08-03 | Discharge: 2021-08-05 | DRG: 062 | Disposition: A | Discharge status: Home / Self Care | Payer: Medicare Other | Attending: Neurology | Admitting: Neurology

## 2021-08-03 DIAGNOSIS — I10 Essential (primary) hypertension: Secondary | ICD-10-CM | POA: Diagnosis present

## 2021-08-03 DIAGNOSIS — Z8673 Personal history of transient ischemic attack (TIA), and cerebral infarction without residual deficits: Secondary | ICD-10-CM | POA: Diagnosis not present

## 2021-08-03 DIAGNOSIS — R200 Anesthesia of skin: Secondary | ICD-10-CM | POA: Diagnosis not present

## 2021-08-03 DIAGNOSIS — R509 Fever, unspecified: Secondary | ICD-10-CM | POA: Diagnosis present

## 2021-08-03 DIAGNOSIS — I69319 Unspecified symptoms and signs involving cognitive functions following cerebral infarction: Secondary | ICD-10-CM

## 2021-08-03 DIAGNOSIS — I6381 Other cerebral infarction due to occlusion or stenosis of small artery: Secondary | ICD-10-CM | POA: Diagnosis not present

## 2021-08-03 DIAGNOSIS — I63412 Cerebral infarction due to embolism of left middle cerebral artery: Principal | ICD-10-CM | POA: Diagnosis present

## 2021-08-03 DIAGNOSIS — Z7982 Long term (current) use of aspirin: Secondary | ICD-10-CM | POA: Diagnosis not present

## 2021-08-03 DIAGNOSIS — R4701 Aphasia: Secondary | ICD-10-CM | POA: Diagnosis present

## 2021-08-03 DIAGNOSIS — Z20822 Contact with and (suspected) exposure to covid-19: Secondary | ICD-10-CM | POA: Diagnosis present

## 2021-08-03 DIAGNOSIS — G8191 Hemiplegia, unspecified affecting right dominant side: Secondary | ICD-10-CM | POA: Diagnosis present

## 2021-08-03 DIAGNOSIS — I480 Paroxysmal atrial fibrillation: Secondary | ICD-10-CM | POA: Diagnosis present

## 2021-08-03 DIAGNOSIS — R54 Age-related physical debility: Secondary | ICD-10-CM | POA: Diagnosis present

## 2021-08-03 DIAGNOSIS — I6501 Occlusion and stenosis of right vertebral artery: Secondary | ICD-10-CM | POA: Diagnosis present

## 2021-08-03 DIAGNOSIS — Z8249 Family history of ischemic heart disease and other diseases of the circulatory system: Secondary | ICD-10-CM | POA: Diagnosis not present

## 2021-08-03 DIAGNOSIS — I639 Cerebral infarction, unspecified: Secondary | ICD-10-CM

## 2021-08-03 DIAGNOSIS — Z79899 Other long term (current) drug therapy: Secondary | ICD-10-CM | POA: Diagnosis not present

## 2021-08-03 DIAGNOSIS — R410 Disorientation, unspecified: Secondary | ICD-10-CM | POA: Diagnosis not present

## 2021-08-03 DIAGNOSIS — E785 Hyperlipidemia, unspecified: Secondary | ICD-10-CM | POA: Diagnosis present

## 2021-08-03 DIAGNOSIS — R531 Weakness: Secondary | ICD-10-CM | POA: Diagnosis not present

## 2021-08-03 DIAGNOSIS — F0390 Unspecified dementia without behavioral disturbance: Secondary | ICD-10-CM | POA: Diagnosis present

## 2021-08-03 DIAGNOSIS — R4702 Dysphasia: Secondary | ICD-10-CM | POA: Diagnosis present

## 2021-08-03 DIAGNOSIS — I6389 Other cerebral infarction: Secondary | ICD-10-CM | POA: Diagnosis not present

## 2021-08-03 DIAGNOSIS — Z9049 Acquired absence of other specified parts of digestive tract: Secondary | ICD-10-CM

## 2021-08-03 DIAGNOSIS — R471 Dysarthria and anarthria: Secondary | ICD-10-CM | POA: Diagnosis present

## 2021-08-03 DIAGNOSIS — I700 Atherosclerosis of aorta: Secondary | ICD-10-CM | POA: Diagnosis present

## 2021-08-03 DIAGNOSIS — Z823 Family history of stroke: Secondary | ICD-10-CM

## 2021-08-03 DIAGNOSIS — R29705 NIHSS score 5: Secondary | ICD-10-CM | POA: Diagnosis present

## 2021-08-03 DIAGNOSIS — I6523 Occlusion and stenosis of bilateral carotid arteries: Secondary | ICD-10-CM | POA: Diagnosis not present

## 2021-08-03 DIAGNOSIS — R29810 Facial weakness: Secondary | ICD-10-CM | POA: Diagnosis present

## 2021-08-03 DIAGNOSIS — Z9183 Wandering in diseases classified elsewhere: Secondary | ICD-10-CM

## 2021-08-03 DIAGNOSIS — I672 Cerebral atherosclerosis: Secondary | ICD-10-CM | POA: Diagnosis not present

## 2021-08-03 LAB — URINALYSIS, ROUTINE W REFLEX MICROSCOPIC
Bilirubin Urine: NEGATIVE
Glucose, UA: NEGATIVE mg/dL
Hgb urine dipstick: NEGATIVE
Ketones, ur: NEGATIVE mg/dL
Leukocytes,Ua: NEGATIVE
Nitrite: NEGATIVE
Protein, ur: 30 mg/dL — AB
Specific Gravity, Urine: >1.046 — ABNORMAL HIGH (ref 1.005–1.030)
pH: 5.5 (ref 5.0–8.0)

## 2021-08-03 LAB — CBC WITH DIFFERENTIAL/PLATELET
Abs Immature Granulocytes: 0.03 10*3/uL (ref 0.00–0.07)
Basophils Absolute: 0 10*3/uL (ref 0.0–0.1)
Basophils Relative: 0 %
Eosinophils Absolute: 0 10*3/uL (ref 0.0–0.5)
Eosinophils Relative: 0 %
HCT: 37.7 % (ref 36.0–46.0)
Hemoglobin: 11.5 g/dL — ABNORMAL LOW (ref 12.0–15.0)
Immature Granulocytes: 0 %
Lymphocytes Relative: 5 %
Lymphs Abs: 0.5 10*3/uL — ABNORMAL LOW (ref 0.7–4.0)
MCH: 23.3 pg — ABNORMAL LOW (ref 26.0–34.0)
MCHC: 30.5 g/dL (ref 30.0–36.0)
MCV: 76.3 fL — ABNORMAL LOW (ref 80.0–100.0)
Monocytes Absolute: 0.5 10*3/uL (ref 0.1–1.0)
Monocytes Relative: 5 %
Neutro Abs: 9.1 10*3/uL — ABNORMAL HIGH (ref 1.7–7.7)
Neutrophils Relative %: 90 %
Platelets: 203 10*3/uL (ref 150–400)
RBC: 4.94 MIL/uL (ref 3.87–5.11)
RDW: 14.5 % (ref 11.5–15.5)
WBC: 10.2 10*3/uL (ref 4.0–10.5)
nRBC: 0 % (ref 0.0–0.2)

## 2021-08-03 LAB — COMPREHENSIVE METABOLIC PANEL
ALT: 17 U/L (ref 0–44)
AST: 16 U/L (ref 15–41)
Albumin: 4.2 g/dL (ref 3.5–5.0)
Alkaline Phosphatase: 54 U/L (ref 38–126)
Anion gap: 10 (ref 5–15)
BUN: 15 mg/dL (ref 8–23)
CO2: 26 mmol/L (ref 22–32)
Calcium: 8.8 mg/dL — ABNORMAL LOW (ref 8.9–10.3)
Chloride: 105 mmol/L (ref 98–111)
Creatinine, Ser: 1.18 mg/dL — ABNORMAL HIGH (ref 0.44–1.00)
GFR, Estimated: 49 mL/min — ABNORMAL LOW (ref >60)
Glucose, Bld: 196 mg/dL — ABNORMAL HIGH (ref 70–99)
Potassium: 3.7 mmol/L (ref 3.5–5.1)
Sodium: 141 mmol/L (ref 135–145)
Total Bilirubin: 0.6 mg/dL (ref 0.3–1.2)
Total Protein: 7.3 g/dL (ref 6.5–8.1)

## 2021-08-03 LAB — MRSA NEXT GEN BY PCR, NASAL: MRSA by PCR Next Gen: NOT DETECTED

## 2021-08-03 LAB — LACTIC ACID, PLASMA
Lactic Acid, Venous: 1.5 mmol/L (ref 0.5–1.9)
Lactic Acid, Venous: 1.4 mmol/L (ref 0.5–1.9)

## 2021-08-03 LAB — SARS CORONAVIRUS 2 BY RT PCR: SARS Coronavirus 2 by RT PCR: NEGATIVE

## 2021-08-03 MED ORDER — PANTOPRAZOLE SODIUM 40 MG IV SOLR
40.0000 mg | Freq: Every day | INTRAVENOUS | Status: DC
Start: 2021-08-03 — End: 2021-08-06
  Administered 2021-08-03 – 2021-08-04 (×2): 40 mg via INTRAVENOUS
  Filled 2021-08-03 (×2): qty 10

## 2021-08-03 MED ORDER — TENECTEPLASE FOR STROKE
0.2500 mg/kg | PACK | Freq: Once | INTRAVENOUS | Status: AC
  Administered 2021-08-03: 13 mg via INTRAVENOUS

## 2021-08-03 MED ORDER — VANCOMYCIN HCL IN DEXTROSE 1-5 GM/200ML-% IV SOLN
1000.0000 mg | INTRAVENOUS | Status: DC
  Filled 2021-08-03: qty 200

## 2021-08-03 MED ORDER — SODIUM CHLORIDE 0.9 % IV SOLN
2.0000 g | INTRAVENOUS | Status: DC
  Administered 2021-08-04: 2 g via INTRAVENOUS
  Filled 2021-08-03 (×2): qty 12.5

## 2021-08-03 MED ORDER — CLEVIDIPINE BUTYRATE 0.5 MG/ML IV EMUL: 0.0000 mg/h | INTRAVENOUS | Status: DC

## 2021-08-03 MED ORDER — STROKE: EARLY STAGES OF RECOVERY BOOK
Freq: Once | Status: AC
Start: 2021-08-04 — End: 2021-08-04
  Filled 2021-08-03: qty 1

## 2021-08-03 MED ORDER — CHLORHEXIDINE GLUCONATE CLOTH 2 % EX PADS
6.0000 | MEDICATED_PAD | Freq: Every day | CUTANEOUS | Status: DC
  Administered 2021-08-05: 6 via TOPICAL

## 2021-08-03 MED ORDER — IOHEXOL 350 MG/ML SOLN
100.0000 mL | Freq: Once | INTRAVENOUS | Status: AC | PRN
  Administered 2021-08-03: 75 mL via INTRAVENOUS

## 2021-08-03 MED ORDER — ACETAMINOPHEN 325 MG PO TABS: 650.0000 mg | ORAL_TABLET | ORAL | Status: DC | PRN

## 2021-08-03 MED ORDER — ACETAMINOPHEN 650 MG RE SUPP: 650.0000 mg | RECTAL | Status: DC | PRN

## 2021-08-03 MED ORDER — ACETAMINOPHEN 160 MG/5ML PO SOLN: 650.0000 mg | ORAL | Status: DC | PRN

## 2021-08-03 MED ORDER — SODIUM CHLORIDE 0.9 % IV SOLN
2.0000 g | Freq: Once | INTRAVENOUS | Status: AC
  Administered 2021-08-03: 2 g via INTRAVENOUS
  Filled 2021-08-03: qty 12.5

## 2021-08-03 MED ORDER — ATORVASTATIN CALCIUM 80 MG PO TABS
80.0000 mg | ORAL_TABLET | Freq: Every day | ORAL | Status: DC
  Administered 2021-08-04 – 2021-08-05 (×2): 80 mg via ORAL
  Filled 2021-08-03 (×2): qty 1

## 2021-08-03 MED ORDER — VANCOMYCIN HCL IN DEXTROSE 1-5 GM/200ML-% IV SOLN
1000.0000 mg | Freq: Once | INTRAVENOUS | Status: AC
  Administered 2021-08-03: 1000 mg via INTRAVENOUS
  Filled 2021-08-03: qty 200

## 2021-08-03 MED ORDER — SODIUM CHLORIDE 0.9 % IV SOLN: INTRAVENOUS | Status: AC

## 2021-08-03 MED ORDER — TENECTEPLASE FOR STROKE
PACK | INTRAVENOUS | Status: AC
  Filled 2021-08-03: qty 10

## 2021-08-03 MED ORDER — LABETALOL HCL 5 MG/ML IV SOLN: 20.0000 mg | Freq: Once | INTRAVENOUS | Status: DC

## 2021-08-03 MED ORDER — SENNOSIDES-DOCUSATE SODIUM 8.6-50 MG PO TABS: 1.0000 | ORAL_TABLET | Freq: Every evening | ORAL | Status: DC | PRN

## 2021-08-03 MED ORDER — METRONIDAZOLE 500 MG/100ML IV SOLN
500.0000 mg | Freq: Once | INTRAVENOUS | Status: AC
  Administered 2021-08-03: 500 mg via INTRAVENOUS
  Filled 2021-08-03: qty 100

## 2021-08-03 MED ORDER — LOSARTAN POTASSIUM 50 MG PO TABS
100.0000 mg | ORAL_TABLET | Freq: Every day | ORAL | Status: DC
  Administered 2021-08-04 – 2021-08-05 (×2): 100 mg via ORAL
  Filled 2021-08-03 (×2): qty 2

## 2021-08-03 MED ORDER — LACTATED RINGERS IV SOLN: INTRAVENOUS | Status: AC

## 2021-08-03 NOTE — ED Provider Notes (Signed)
[facility] Provider Note   CSN: [id number] Arrival date & time: 08/03/21  1355     History  Chief Complaint  Patient presents with   Code Stroke    Sherry Fitzgerald is a 76 y.o. female.  The history is provided by the patient, the spouse and medical records. The history is limited by the condition of the patient. No language interpreter was used.  Cerebrovascular Accident This is a new problem. The current episode started less than 1 hour ago. The problem occurs constantly. The problem has not changed since onset.Pertinent negatives include no chest pain, no abdominal pain, no headaches and no shortness of breath. Nothing aggravates the symptoms. Nothing relieves the symptoms. She has tried nothing for the symptoms. The treatment provided no relief.       Home Medications Prior to Admission medications   Medication Sig Start Date End Date Taking? Authorizing Provider  aspirin 325 MG tablet Take 325 mg by mouth daily.    [provider]  atorvastatin (LIPITOR) 80 MG tablet Take 1 tablet (80 mg total) by mouth daily. 11/08/17   Guadalupe Dawn, MD  losartan (COZAAR) 100 MG tablet TAKE 1 TABLET (100 MG TOTAL) BY MOUTH DAILY. NEED OFFICE VISIT 06/21/20   Pixie Casino, MD  memantine (NAMENDA) 10 MG tablet Take 1 tablet (10 mg total) by mouth 2 (two) times daily. 12/09/20   Frann Rider, NP      Allergies    Patient has no known allergies.    Review of Systems   Review of Systems  Unable to perform ROS: Acuity of condition (aphasia)  Constitutional:  Negative for chills, diaphoresis, fatigue and fever.  HENT:  Negative for congestion.   Eyes:  Negative for visual disturbance.  Respiratory:  Negative for cough, chest tightness, shortness of breath and wheezing.   Cardiovascular:  Negative for chest pain and palpitations.  Gastrointestinal:  Negative for abdominal distention, abdominal pain, diarrhea, nausea and vomiting.  Genitourinary:   Negative for dysuria and flank pain.  Musculoskeletal:  Negative for back pain, neck pain and neck stiffness.  Skin:  Negative for rash and wound.  Neurological:  Positive for speech difficulty and numbness. Negative for dizziness, seizures, facial asymmetry, weakness, light-headedness and headaches.  Psychiatric/Behavioral:  Negative for agitation and confusion.     Physical Exam Updated Vital Signs BP (!) 175/72 (BP Location: Right Arm)   Pulse 91   Temp 98 F (36.7 C) (Oral)   Resp 16   Wt 52.2 kg   SpO2 99%   BMI 25.80 kg/m  Physical Exam Vitals and nursing note reviewed.  Constitutional:      General: She is not in acute distress.    Appearance: She is well-developed. She is not ill-appearing, toxic-appearing or diaphoretic.  HENT:     Head: Normocephalic and atraumatic.     Mouth/Throat:     Mouth: Mucous membranes are moist.  Eyes:     Extraocular Movements: Extraocular movements intact.     Conjunctiva/sclera: Conjunctivae normal.     Pupils: Pupils are equal, round, and reactive to light.  Neck:     Vascular: No carotid bruit.  Cardiovascular:     Rate and Rhythm: Normal rate and regular rhythm.     Heart sounds: No murmur heard. Pulmonary:     Effort: Pulmonary effort is normal. No respiratory distress.     Breath sounds: Normal breath sounds. No wheezing, rhonchi or rales.  Chest:  Chest wall: No tenderness.  Abdominal:     General: Abdomen is flat.     Palpations: Abdomen is soft.     Tenderness: There is no abdominal tenderness. There is no right CVA tenderness, left CVA tenderness, guarding or rebound.  Musculoskeletal:        General: No swelling or tenderness.     Cervical back: Neck supple. No tenderness.     Right lower leg: No edema.     Left lower leg: No edema.  Skin:    General: Skin is warm and dry.     Capillary Refill: Capillary refill takes less than 2 seconds.     Findings: No erythema or rash.  Neurological:     Mental Status: She  is alert.     Cranial Nerves: No dysarthria or facial asymmetry.     Sensory: Sensory deficit present.     Motor: No weakness, abnormal muscle tone or seizure activity.     Comments: Patient has aphasia, object naming difficulty, and right-sided facial, arm, and leg numbness.  No other focal weakness appreciated.  Exam otherwise unremarkable.  Psychiatric:        Mood and Affect: Mood normal.     ED Results / Procedures / Treatments   Labs (all labs ordered are listed, but only abnormal results are displayed) Labs Reviewed  CBC WITH DIFFERENTIAL/PLATELET - Abnormal; Notable for the following components:      Result Value   Hemoglobin 11.5 (*)    MCV 76.3 (*)    MCH 23.3 (*)    Neutro Abs 9.1 (*)    Lymphs Abs 0.5 (*)    All other components within normal limits  COMPREHENSIVE METABOLIC PANEL - Abnormal; Notable for the following components:   Glucose, Bld 196 (*)    Creatinine, Ser 1.18 (*)    Calcium 8.8 (*)    GFR, Estimated 49 (*)    All other components within normal limits  CULTURE, BLOOD (ROUTINE X 2)  CULTURE, BLOOD (ROUTINE X 2)  SARS CORONAVIRUS 2 BY RT PCR  URINE CULTURE  LACTIC ACID, PLASMA  LACTIC ACID, PLASMA  URINALYSIS, ROUTINE W REFLEX MICROSCOPIC    EKG EKG Interpretation  Date/Time:  Sunday August 03 2021 14:43:29 EDT Ventricular Rate:  84 PR Interval:  147 QRS Duration: 85 QT Interval:  348 QTC Calculation: 412 R Axis:   51 Text Interpretation: Age not entered, assumed to be  76 years old for purpose of ECG interpretation Sinus rhythm Atrial premature complexes Borderline T wave abnormalities Minimal ST elevation, anterior leads when compared to prior, similar appearance. No STEMI Confirmed by Antony Blackbird [phone]) on 08/03/2021 4:35:17 PM  Radiology DG Chest Portable 1 View  Result Date: 08/03/2021 CLINICAL DATA:  Fever. EXAM: PORTABLE CHEST 1 VIEW COMPARISON:  Chest x-ray 02/09/2011 FINDINGS: The heart size and mediastinal contours are within  normal limits. Both lungs are clear. No acute fractures are seen. There is a healed left clavicular fracture. IMPRESSION: No active disease. Electronically Signed   By: Ronney Asters M.D.   On: 08/03/2021 15:41   CT ANGIO HEAD NECK W WO CM (CODE STROKE)  Result Date: 08/03/2021 CLINICAL DATA:  Acute onset of aphasia and right-sided weakness. EXAM: CT ANGIOGRAPHY HEAD AND NECK TECHNIQUE: Multidetector CT imaging of the head and neck was performed using the standard protocol during bolus administration of intravenous contrast. Multiplanar CT image reconstructions and MIPs were obtained to evaluate the vascular anatomy. Carotid stenosis measurements (when applicable) are obtained  utilizing NASCET criteria, using the distal internal carotid diameter as the denominator. RADIATION DOSE REDUCTION: This exam was performed according to the departmental dose-optimization program which includes automated exposure control, adjustment of the mA and/or kV according to patient size and/or use of iterative reconstruction technique. CONTRAST:  106mL OMNIPAQUE IOHEXOL 350 MG/ML SOLN COMPARISON:  CT head without contrast 08/03/2021. CTA head and neck 11/19/2017 FINDINGS: CTA NECK FINDINGS Aortic arch: Minimal atherosclerotic changes are present in the distal arch. Great vessel origins scratched at minimal calcification present at the left subclavian origin, stable. Great vessel origins are otherwise within normal limits. No significant stenosis or aneurysm is present. Right carotid system: Right common carotid artery demonstrates stable mild noncalcified mural changes distally. Mild atherosclerotic changes are again noted the right carotid bifurcation without significant stenosis. Cervical right ICA is otherwise normal. Left carotid system: The left common carotid artery is within normal limits. Atherosclerotic changes are present at the bifurcation without a significant stenosis. Mild tortuosity of the cervical left ICA is stable  without significant stenosis. Vertebral arteries: The left vertebral artery is the dominant vessel. Both vertebral arteries originate from the subclavian arteries. High-grade stenosis is present at the origin of the hypoplastic right vertebral artery. No other stenoses are present in the neck. Skeleton: Vertebral body heights and alignment are normal. No focal osseous lesions are present. Other neck: Soft tissues the neck are otherwise unremarkable. Salivary glands are within normal limits. Thyroid is normal. No significant adenopathy is present. No focal mucosal or submucosal lesions are present. Upper chest: The lung apices are clear. Thoracic inlet is within normal limits. Review of the MIP images confirms the above findings CTA HEAD FINDINGS Anterior circulation: Atherosclerotic calcifications are again seen within the cavernous internal carotid arteries bilaterally without a significant stenosis relative to the distal vessel. ICA termini are within normal limits bilaterally. The A1 and M1 segments are normal. The anterior communicating artery is patent. The MCA bifurcations are within normal limits bilaterally. The ACA and MCA branch vessels are within normal limits bilaterally. Posterior circulation: Tandem high-grade stenoses are present in the right vertebral artery. Left PICA origin is visualized and normal. Vertebrobasilar junction and basilar artery is normal. Both posterior cerebral arteries originate from the basilar tip. Venous sinuses: The dural sinuses are patent. The straight sinus deep cerebral veins are intact. Cortical veins are within normal limits. No significant vascular malformation is evident. Anatomic variants: No significant anatomic variant Review of the MIP images confirms the above findings IMPRESSION: 1. 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 2. Tandem high-grade stenoses in the intracranial right vertebral artery. 3. No other significant stenosis  in the neck. 4. No other significant proximal stenosis, aneurysm, or branch vessel occlusion within the Circle of Willis. 5. Aortic Atherosclerosis (ICD10-I70.0). Electronically Signed   By: Marin Roberts M.D.   On: 08/03/2021 15:29   CT HEAD CODE STROKE WO CONTRAST`  Result Date: 08/03/2021 CLINICAL DATA:  Code stroke. Aphasia. Right-sided weakness. Symptoms began less than 1 hour ago. EXAM: CT HEAD WITHOUT CONTRAST TECHNIQUE: Contiguous axial images were obtained from the base of the skull through the vertex without intravenous contrast. RADIATION DOSE REDUCTION: This exam was performed according to the departmental dose-optimization program which includes automated exposure control, adjustment of the mA and/or kV according to patient size and/or use of iterative reconstruction technique. COMPARISON:  07/31/2020 FINDINGS: Brain: Remote infarcts of the right lentiform nucleus and caudate head are noted. Ex vacuo dilation of the right  lateral ventricle is associated. A remote lacunar infarct is present in the left lentiform nucleus. No acute infarct, hemorrhage, or mass lesion is present. Mild atrophy and white matter changes are otherwise stable. Vascular: Atherosclerotic calcifications are present within the cavernous internal carotid arteries. No hyperdense vessel is present. Skull: Calvarium is intact. No focal lytic or blastic lesions are present. No significant extracranial soft tissue lesion is present. Sinuses/Orbits: The paranasal sinuses and mastoid air cells are clear. The globes and orbits are within normal limits. Other: ASPECTS ([place] Stroke Program Early CT Score) - Ganglionic level infarction (caudate, lentiform nuclei, internal capsule, insula, M1-M3 cortex): 7/7 - Supraganglionic infarction (M4-M6 cortex): 3/3 Total score (0-10 with 10 being normal): 10/10 IMPRESSION: 1. Stable remote bilateral basal ganglia lacunar infarcts. 2. No acute intracranial abnormality 3. ASPECTS is 10/10  These results were called by telephone at the time of interpretation on 08/03/2021 at 2:39 pm to provider [facility] , who verbally acknowledged these results. Electronically Signed   By: San Morelle M.D.   On: 08/03/2021 14:42    Procedures Procedures    CRITICAL CARE Performed by: Gwenyth Allegra [facility] Total critical care time: 45 minutes Critical care time was exclusive of separately billable procedures and treating other patients. Critical care was necessary to treat or prevent imminent or life-threatening deterioration. Critical care was time spent personally by me on the following activities: development of treatment plan with patient and/or surrogate as well as nursing, discussions with consultants, evaluation of patient's response to treatment, examination of patient, obtaining history from patient or surrogate, ordering and performing treatments and interventions, ordering and review of laboratory studies, ordering and review of radiographic studies, pulse oximetry and re-evaluation of patient's condition.   Medications Ordered in ED Medications  tenecteplase (TNKASE) 50 MG injection for Stroke (has no administration in time range)  labetalol (NORMODYNE) injection 20 mg (has no administration in time range)    And  clevidipine (CLEVIPREX) infusion 0.5 mg/mL (has no administration in time range)  lactated ringers infusion ( Intravenous New Bag/Given 08/03/21 1618)  ceFEPIme (MAXIPIME) 2 g in sodium chloride 0.9 % 100 mL IVPB (2 g Intravenous New Bag/Given 08/03/21 1607)  metroNIDAZOLE (FLAGYL) IVPB 500 mg (500 mg Intravenous New Bag/Given 08/03/21 1613)  vancomycin (VANCOCIN) IVPB 1000 mg/200 mL premix (1,000 mg Intravenous New Bag/Given 08/03/21 1609)  tenecteplase (TNKASE) injection for Stroke 13 mg (13 mg Intravenous Given 08/03/21 1444)  iohexol (OMNIPAQUE) 350 MG/ML injection 100 mL (75 mLs Intravenous Contrast Given 08/03/21 1503)    ED Course/ Medical Decision  Making/ A&P                           Medical Decision Making Amount and/or Complexity of Data Reviewed Radiology: ordered.    Jeliah Apkarian is a 76 y.o. female with a past medical history significant for hypertension, hyperlipidemia, and previous stroke with vascular dementia who presents with right-sided numbness and speech abnormality.  According to husband, approximately 15 minutes prior to arrival (1:30 PM) patient started not making any sense with her speech.  He had to pick up and carry as she was not able to move correctly and brought her in for evaluation.  He reports that she has had a stroke before and she currently has poor memory with vascular dementia from it.  Otherwise she has no focal physical deficits that he reports.  On my initial exam, patient does indeed have aphasia and is not  able to name items presented to her and is not answering questions appropriately.  Her lungs were clear and chest was nontender.  Abdomen nontender.  Intact strength in extremities however she did have numbness in her right face, right arm, and right leg.  Pupils are symmetric and reactive with normal extraocular movements.  Code stroke activated given her history and the symptoms presenting today.  Patient quickly taken to CT scanner and after discussion with neurology they did not see bleed.  Patient was offered and family consented for TNK.  She was given this and shortly thereafter her speech significant proved.  Her speech is now much improved and she has no further numbness.  During her work-up she did end up developing a fever, unclear if this is related to something neurologic versus an infection.  I spoke to Dr. Wilford Corner with neurology who recommended labs and broad-spectrum antibiotics as well as urinalysis and chest x-ray.  These were ordered.  They will admit to the neuro ICU and follow-up on these results.  He did not feel that she needed lumbar puncture at this time but if symptoms change or  other concerning things develop they would do that as an inpatient.  This was felt to be reasonable given her lack of any headache or neck pain.  Patient admitted to neuro ICU after TNK for acute stroke with improving symptoms.         Final Clinical Impression(s) / ED Diagnoses Final diagnoses:  Aphasia  Right sided numbness  Fever, unspecified fever cause    Clinical Impression: 1. Aphasia   2. Right sided numbness   3. Fever, unspecified fever cause     Disposition: Admit  This note was prepared with assistance of Dragon voice recognition software. Occasional wrong-word or sound-a-like substitutions may have occurred due to the inherent limitations of voice recognition software.     Kristianna Saperstein, Canary Brim, MD 08/03/21 [phone]

## 2021-08-03 NOTE — ED Triage Notes (Signed)
Her husband is with her and reports that ~ 15 minutes before arrival pt. Lost the ability to utilize her usual computer I pad and she experienced balance disturbances. He states pt. 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 She is able to move all extremities and is able to stand/pivot into bed. She denies pain.

## 2021-08-03 NOTE — ED Notes (Signed)
Time is wrong. Assessment time was 1500.

## 2021-08-03 NOTE — Consult Note (Addendum)
[facility] Telemedicine Consult   Requesting Provider: Dr Sherry Ruffing Consult Participants: Dr. Jerelyn Charles, Telespecialist RN Misty   Bedside RN Cristina Gong. Location of the provider [facility] Location of the patient: ER-DB Bed 7  This consult was provided via telemedicine with 2-way video and audio communication. The patient/family was informed that care would be provided in this way and agreed to receive care in this manner.    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 She was normal this morning and in the afternoon around 1:30 PM had a sudden onset of confusion where she started touching her husband's computer screen as if it were a touch screen knowing very well that it was not.  She could not communicate well.  Her speech was slurred.  He thought something was wrong and got in the car to get her to the emergency room.  She had a difficult time getting into the car and could not close the door of the car herself as she did not know what to do and appeared confused.  In the emergency department, she was seen and had some right-sided subtle facial droop and right-sided weakness.  Code stroke was activated.  Upon my examination, I do not appreciate the right-sided weakness or droop but the aphasia symptoms were prominent.  Noncontrast head CT was reviewed by me personally-no bleed, aspects 10.  Discussed IV thrombolysis with the husband-risks and benefits discussed who agreed to proceed.   Past Medical History:  Diagnosis Date   Cerebrovascular accident (CVA) ([place])    Hypertension    no  medications for years    Current Facility-Administered Medications:    labetalol (NORMODYNE) injection 20 mg, 20 mg, Intravenous, Once **AND** clevidipine (CLEVIPREX) infusion 0.5 mg/mL, 0-21 mg/hr, Intravenous, Continuous, Amie Portland, MD   tenecteplase (TNKASE) 50 MG injection for Stroke, , , ,    tenecteplase (TNKASE) injection for Stroke 13 mg, 0.25 mg/kg, Intravenous, Once, Amie Portland, MD  Current Outpatient Medications:    aspirin 325 MG tablet, Take 325 mg by mouth daily., Disp: , Rfl:    atorvastatin (LIPITOR) 80 MG tablet, Take 1 tablet (80 mg total) by mouth daily., Disp: 90 tablet, Rfl: 0   losartan (COZAAR) 100 MG tablet, TAKE 1 TABLET (100 MG TOTAL) BY MOUTH DAILY. NEED OFFICE VISIT, Disp: 15 tablet, Rfl: 0   memantine (NAMENDA) 10 MG tablet, Take 1 tablet (10 mg total) by mouth 2 (two) times daily., Disp: 180 tablet, Rfl: 3  LKW: 1330 tpa given?: yes IR Thrombectomy? No, poor MRS, no ELVO Modified Rankin: 3-patient does not use assistance to walk and can do her ADLs but has more issues with wandering off and requiring 24/7 supervision for her own safety. Time of teleneurologist evaluation: 1421 Page received 1420 TNK order 1433hrs, given at 1444 hrs  Exam: Vitals:   08/03/21 1407  BP: (!) 175/72  Pulse: 91  Resp: 16  Temp: 98 F (36.7 C)  SpO2: 99%    General: Awake, alert Neurological exam AAOx1 Mild aphasia Mild dysarthria CN intact Motor: no drift Sensory: intact wtihj question of right sided extinction Corrd: no dysmetria  NIHSS 1A:  Level of Consciousness - 0 1B: Ask Month and Age - 2 1C: 'Blink Eyes' & 'Squeeze Hands' - 0 2: Test Horizontal Extraocular Movements - 0 3: Test Visual Fields - 0 4: Test Facial Palsy - 0 5A: Test Left Arm Motor Drift - 0 5B: Test Right Arm Motor Drift - 0 6A: Test Left Leg Motor Drift - 0 6B: Test Right Leg Motor Drift - 0 7: Test Limb Ataxia - 0 8: Test Sensation - 0 9: Test Language/Aphasia- 1 10: Test  Dysarthria - 1 11: Test Extinction/Inattention - 1 NIHSS score: 5  Imaging Reviewed: CTH - no bleed   Labs reviewed in epic and pertinent values follow: CBC    Component Value Date/Time   WBC 9.7 07/30/2020 1625   RBC 5.16 (H) 07/30/2020 1625   HGB 11.8 (L) 07/30/2020 1625   HCT 39.7 07/30/2020 1625   PLT 216 07/30/2020 1625   MCV 76.9 (L) 07/30/2020 1625   MCH 22.9 (L) 07/30/2020 1625   MCHC 29.7 (L) 07/30/2020 1625   RDW 13.9 07/30/2020 1625   LYMPHSABS 0.6 (L) 07/30/2020 1625   MONOABS 0.7 07/30/2020 1625   EOSABS 0.0 07/30/2020 1625   BASOSABS 0.0 07/30/2020 1625   CMP     Component Value Date/Time   NA 139 07/30/2020 1625   K 3.9 07/30/2020 1625   CL 102 07/30/2020 1625   CO2 29 07/30/2020 1625   GLUCOSE 136 (H) 07/30/2020 1625   BUN 15 07/30/2020 1625   CREATININE 0.86 07/30/2020 1625   CREATININE 0.77 07/30/2014 1659   CALCIUM 8.9 07/30/2020 1625   PROT 7.2 07/30/2020 1625   ALBUMIN 3.7 07/30/2020 1625   AST 21 07/30/2020 1625   ALT 17 07/30/2020 1625   ALKPHOS 60 07/30/2020 1625   BILITOT 0.6 07/30/2020 1625   GFRNONAA >60 07/30/2020 1625   GFRNONAA 81 07/30/2014 1659   GFRAA >60 09/17/2017 0430   GFRAA >89 07/30/2014 1659     Assessment: 76 year old with prior stroke and residual memory issues, somewhat poor baseline requiring 24/7 help for her safety as she wanders off and has difficult time keeping herself safe, presented with sudden onset of confusion, on examination aphasia and dysarthria and some transient right facial weakness and right-sided arm and leg weakness.  On my examination NIH stroke scale 5-as documented above. Risk benefits for IV thrombolysis discussed with the patient.  CT head reviewed by me personally prior to [facility] ministration.  Husband agreed.  TNK ordered and administered as above.  Likely top of the differential given her prior history of stroke and current exam is a new left hemispheric stroke.  Other possibilities could be  recrudescence of old symptoms and symptoms related to a toxic metabolic encephalopathy as well as seizure.  Recommendations:  Admit to neuro ICU @ [facility]-under stroke service.  Frequent neurochecks per the post TNK protocol As needed blood pressure medications-labetalol ordered.  Cleviprex drip also in the order set in case she is above blood pressure goal. Blood pressure goal 180/105. Telemetry Twelve-lead EKG No antiplatelets or anticoagulants for at least 24 hours TNKase. MRI brain without contrast 2D echo A1c Lipid panel PT OT and speech therapy Check chest x-ray and urinalysis Check EEG I will get a stat CTA head and neck but I am very less likely to go the thrombectomy given her poor baseline.  Discussed with EDP Dr. Janelle Floor as well as [facility] neurologist Dr. Amada Jupiter    [facility] CTA head and neck IMPRESSION: 1. 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 2. Tandem high-grade stenoses in the intracranial right vertebral artery. 3. No other significant stenosis in the neck. 4. No other significant proximal stenosis, aneurysm, or branch vessel occlusion within the Circle of Willis. 5. Aortic Atherosclerosis (ICD10-I70.0).  Recs remain as above. No ELVO  -- Milon Dikes, MD [facility] Pager: [phone] If 7pm to 7am, please call on call as listed on AMION.   CRITICAL CARE ATTESTATION Performed by: Milon Dikes, MD Total critical care time: 32 minutes Critical care time was exclusive of separately billable procedures and treating other patients and/or supervising APPs/Residents/Students Critical care was necessary to treat or prevent imminent or life-threatening deterioration due to acute ischemic stroke, IV thrombolysis This patient is critically ill and at significant risk for neurological worsening and/or death and care requires constant monitoring. Critical care was time spent personally by me on the  following activities: development of treatment plan with patient and/or surrogate as well as nursing, discussions with consultants, evaluation of patient's response to treatment, examination of patient, obtaining history from patient or surrogate, ordering and performing treatments and interventions, ordering and review of laboratory studies, ordering and review of radiographic studies, pulse oximetry, re-evaluation of patient's condition, participation in multidisciplinary rounds and medical decision making of high complexity in the care of this patient.

## 2021-08-03 NOTE — Progress Notes (Signed)
Belongings include:  3 yellow metal rings 1 silver ring Cell phone Clothes Purse with $30 to be taken home by husband.

## 2021-08-03 NOTE — Consult Note (Signed)
Code stroke activation time 1412 Dr Wilford Corner on screen at 1421 Dr Wilford Corner notified of CTA results at 1535 MRS 3 Dr Wilford Corner paged for code stroke at 1420 PT to CT at 1415 Pt back from CT at 1421.

## 2021-08-03 NOTE — H&P (Incomplete)
Admission H&P    Chief Complaint: Stroke s/p TNK at outside hospital  HPI: Sherry Fitzgerald is an 76 y.o. female with a PMHx of stroke with residual memory dysfunction (remote bilateral basal ganglia lacunar infarcts seen on CT) and HTN who presented to the [facility] ED today with sudden onset of confusion, dysphasia and dysarthria. Exam in the ED revealed right facial droop, right sided weakness and aphasia. Teleneurology consult was obtained and she was administered TNK. She has been transferred to [facility] for further management.   Dr. 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 She was normal this morning and in the afternoon around 1:30 PM had a sudden onset of confusion where she started touching her husband's computer screen as if it were a touch screen knowing very well that it was not.  She could not communicate well.  Her speech was slurred.  He thought something was wrong and got in the car to get her to the emergency room.  She had a difficult time getting into the car and could not close the door of the car herself as she did not know what to do and appeared confused.  In the emergency department, she was seen and had some right-sided subtle facial droop and right-sided weakness.  Code stroke was activated.  Upon my examination, I do not appreciate the right-sided weakness or droop but the aphasia symptoms were prominent.  Noncontrast head CT was reviewed by me personally-no bleed, aspects 10.  Discussed IV thrombolysis with the husband-risks and benefits discussed who agreed to  proceed."  Home medications: ASA, atorvastatin, losartan and memantine.   Past Medical History:  Diagnosis Date   Cerebrovascular accident (CVA) ([place])    Hypertension    no medications for years    Past Surgical History:  Procedure Laterality Date   CHOLECYSTECTOMY  2014    Family History  Problem Relation Age of Onset   CVA Mother    Heart disease Father 49   CVA Father    Social History:  reports that she has never smoked. She has never used smokeless tobacco. She reports that she does not drink alcohol and does not use drugs.  Allergies: No Known Allergies  Medications Prior to Admission  Medication Sig Dispense Refill   aspirin 325 MG tablet Take 325 mg by mouth daily.     atorvastatin (LIPITOR) 80 MG tablet Take 1 tablet (80 mg total) by mouth daily. 90 tablet 0   losartan (COZAAR) 100 MG tablet TAKE 1 TABLET (100 MG TOTAL) BY MOUTH DAILY. NEED OFFICE VISIT 15 tablet 0   memantine (NAMENDA) 10 MG tablet Take 1 tablet (10 mg total) by mouth 2 (two) times daily. 180 tablet 3    ROS: Endorses recent memory difficulties. No other complaints.   Physical Examination: Blood pressure (!) 138/48, pulse 78, temperature (!) 102.3 F (39.1 C), temperature source Oral, resp. rate (!) 25, height $RemoveBe'4\' 8"'feLMTIslC$  (1.422 m), weight 52.2 kg, SpO2 95 %.  HEENT-  [place]/AT. No meningismus. Does not appear toxic. Lungs - Respirations unlabored Extremities - No edema  Neurologic Examination: Mental Status: Alert, oriented to  the city and the state, but not the day of the week, the year or the month. States that she is in the hospital for memory problems. In the context of English not being her primary language, speech is nondysarthric and fluent with intact comprehension and naming. Intact comprehension for all questions and commands. Good eye contact. Good long term memory regarding moving [place] and her time in [place] prior to leaving. Short term memory is impaired with rapid forgetting of  items told to the patient only a few minutes earlier.  Cranial Nerves: II: Visual fields intact when tested individually. There is extinction on the left to DSS. PERRL.  III,IV, VI: No ptosis. EOMI. No nystagmus. V: Temp sensation equal bilaterally  VII: Smile symmetric VIII: Hearing intact to voice IX,X: No hoarseness XI: Symmetric shoulder shrug XII: Midline tongue extension Motor: BUE 5/5 proximally and distally BLE 5/5 proximally and distally  No pronator drift.  Sensory: Temp and light touch intact throughout, bilaterally. Positive for extinction on the left to DSS.  Deep Tendon Reflexes: 2+ and symmetric throughout Cerebellar: No ataxia with FNF bilaterally  Gait: Deferred   Results for orders placed or performed during the hospital encounter of 08/03/21 (from the past 48 hour(s))  CBC with Differential     Status: Abnormal   Collection Time: 08/03/21  3:18 PM  Result Value Ref Range   WBC 10.2 4.0 - 10.5 K/uL   RBC 4.94 3.87 - 5.11 MIL/uL   Hemoglobin 11.5 (L) 12.0 - 15.0 g/dL   HCT 37.7 36.0 - 46.0 %   MCV 76.3 (L) 80.0 - 100.0 fL   MCH 23.3 (L) 26.0 - 34.0 pg   MCHC 30.5 30.0 - 36.0 g/dL   RDW 14.5 11.5 - 15.5 %   Platelets 203 150 - 400 K/uL   nRBC 0.0 0.0 - 0.2 %   Neutrophils Relative % 90 %   Neutro Abs 9.1 (H) 1.7 - 7.7 K/uL   Lymphocytes Relative 5 %   Lymphs Abs 0.5 (L) 0.7 - 4.0 K/uL   Monocytes Relative 5 %   Monocytes Absolute 0.5 0.1 - 1.0 K/uL   Eosinophils Relative 0 %   Eosinophils Absolute 0.0 0.0 - 0.5 K/uL   Basophils Relative 0 %   Basophils Absolute 0.0 0.0 - 0.1 K/uL   Immature Granulocytes 0 %   Abs Immature Granulocytes 0.03 0.00 - 0.07 K/uL    Comment: Performed at [facility], [place], [place] [address]  Comprehensive metabolic panel     Status: Abnormal   Collection Time: 08/03/21  3:18 PM  Result Value Ref Range   Sodium 141 135 - 145 mmol/L   Potassium 3.7 3.5 - 5.1 mmol/L   Chloride 105 98 -  111 mmol/L   CO2 26 22 - 32 mmol/L   Glucose, Bld 196 (H) 70 - 99 mg/dL    Comment: Glucose reference range applies only to samples taken after fasting for at least 8 hours.   BUN 15 8 - 23 mg/dL   Creatinine, Ser 1.18 (H) 0.44 - 1.00 mg/dL   Calcium 8.8 (L) 8.9 - 10.3 mg/dL   Total Protein 7.3 6.5 - 8.1 g/dL   Albumin 4.2 3.5 - 5.0 g/dL   AST 16 15 - 41 U/L   ALT 17 0 - 44 U/L   Alkaline Phosphatase 54 38 - 126 U/L   Total Bilirubin 0.6 0.3 - 1.2 mg/dL   GFR, Estimated 49 (L) >60  mL/min    Comment: (NOTE) Calculated using the CKD-EPI Creatinine Equation (2021)    Anion gap 10 5 - 15    Comment: Performed at [facility], [address], [place], [place] [address]  Urinalysis, Routine w reflex microscopic Anterior Nasal Swab     Status: Abnormal   Collection Time: 08/03/21  3:19 PM  Result Value Ref Range   Color, Urine YELLOW YELLOW   APPearance CLEAR CLEAR   Specific Gravity, Urine >1.046 (H) 1.005 - 1.030   pH 5.5 5.0 - 8.0   Glucose, UA NEGATIVE NEGATIVE mg/dL   Hgb urine dipstick NEGATIVE NEGATIVE   Bilirubin Urine NEGATIVE NEGATIVE   Ketones, ur NEGATIVE NEGATIVE mg/dL   Protein, ur 30 (A) NEGATIVE mg/dL   Nitrite NEGATIVE NEGATIVE   Leukocytes,Ua NEGATIVE NEGATIVE   RBC / HPF 6-10 0 - 5 RBC/hpf   WBC, UA 0-5 0 - 5 WBC/hpf   Squamous Epithelial / LPF 0-5 0 - 5   Mucus PRESENT     Comment: Performed at [facility], [place], [place] [address]  Lactic acid, plasma     Status: None   Collection Time: 08/03/21  3:53 PM  Result Value Ref Range   Lactic Acid, Venous 1.5 0.5 - 1.9 mmol/L    Comment: Performed at [facility], [place], [place] [address]  SARS Coronavirus 2 by RT PCR (hospital order, performed in [facility] hospital lab) *cepheid single result test* Anterior Nasal Swab     Status: None   Collection Time: 08/03/21  3:53 PM   Specimen: Anterior Nasal Swab  Result  Value Ref Range   SARS Coronavirus 2 by RT PCR NEGATIVE NEGATIVE    Comment: (NOTE) SARS-CoV-2 target nucleic acids are NOT DETECTED.  The SARS-CoV-2 RNA is generally detectable in upper and lower respiratory specimens during the acute phase of infection. The lowest concentration of SARS-CoV-2 viral copies this assay can detect is 250 copies / mL. A negative result does not preclude SARS-CoV-2 infection and should not be used as the sole basis for treatment or other patient management decisions.  A negative result may occur with improper specimen collection / handling, submission of specimen other than nasopharyngeal swab, presence of viral mutation(s) within the areas targeted by this assay, and inadequate number of viral copies (<250 copies / mL). A negative result must be combined with clinical observations, patient history, and epidemiological information.  Fact Sheet for Patients:   https://www.patel.info/  Fact Sheet for Healthcare Providers: https://hall.com/  This test is not yet approved or  cleared by the [place] FDA and has been authorized for detection and/or diagnosis of SARS-CoV-2 by FDA under an Emergency Use Authorization (EUA).  This EUA will remain in effect (meaning this test can be used) for the duration of the COVID-19 declaration under Section 564(b)(1) of the Act, 21 U.S.C. section 360bbb-3(b)(1), unless the authorization is terminated or revoked sooner.  Performed at [facility], [address], [place], [place] [address]    DG Chest Portable 1 View  Result Date: 08/03/2021 CLINICAL DATA:  Fever. EXAM: PORTABLE CHEST 1 VIEW COMPARISON:  Chest x-ray 02/09/2011 FINDINGS: The heart size and mediastinal contours are within normal limits. Both lungs are clear. No acute fractures are seen. There is a healed left clavicular fracture. IMPRESSION: No active disease. Electronically Signed   By: Ronney Asters M.D.   On: 08/03/2021 15:41   CT ANGIO HEAD NECK W WO  CM (CODE STROKE)  Result Date: 08/03/2021 CLINICAL DATA:  Acute onset of aphasia and right-sided weakness. EXAM: CT ANGIOGRAPHY HEAD AND NECK TECHNIQUE: Multidetector CT imaging of the head and neck was performed using the standard protocol during bolus administration of intravenous contrast. Multiplanar CT image reconstructions and MIPs were obtained to evaluate the vascular anatomy. Carotid stenosis measurements (when applicable) are obtained utilizing NASCET criteria, using the distal internal carotid diameter as the denominator. RADIATION DOSE REDUCTION: This exam was performed according to the departmental dose-optimization program which includes automated exposure control, adjustment of the mA and/or kV according to patient size and/or use of iterative reconstruction technique. CONTRAST:  6mL OMNIPAQUE IOHEXOL 350 MG/ML SOLN COMPARISON:  CT head without contrast 08/03/2021. CTA head and neck 11/19/2017 FINDINGS: CTA NECK FINDINGS Aortic arch: Minimal atherosclerotic changes are present in the distal arch. Great vessel origins scratched at minimal calcification present at the left subclavian origin, stable. Great vessel origins are otherwise within normal limits. No significant stenosis or aneurysm is present. Right carotid system: Right common carotid artery demonstrates stable mild noncalcified mural changes distally. Mild atherosclerotic changes are again noted the right carotid bifurcation without significant stenosis. Cervical right ICA is otherwise normal. Left carotid system: The left common carotid artery is within normal limits. Atherosclerotic changes are present at the bifurcation without a significant stenosis. Mild tortuosity of the cervical left ICA is stable without significant stenosis. Vertebral arteries: The left vertebral artery is the dominant vessel. Both vertebral arteries originate from the subclavian arteries.  High-grade stenosis is present at the origin of the hypoplastic right vertebral artery. No other stenoses are present in the neck. Skeleton: Vertebral body heights and alignment are normal. No focal osseous lesions are present. Other neck: Soft tissues the neck are otherwise unremarkable. Salivary glands are within normal limits. Thyroid is normal. No significant adenopathy is present. No focal mucosal or submucosal lesions are present. Upper chest: The lung apices are clear. Thoracic inlet is within normal limits. Review of the MIP images confirms the above findings CTA HEAD FINDINGS Anterior circulation: Atherosclerotic calcifications are again seen within the cavernous internal carotid arteries bilaterally without a significant stenosis relative to the distal vessel. ICA termini are within normal limits bilaterally. The A1 and M1 segments are normal. The anterior communicating artery is patent. The MCA bifurcations are within normal limits bilaterally. The ACA and MCA branch vessels are within normal limits bilaterally. Posterior circulation: Tandem high-grade stenoses are present in the right vertebral artery. Left PICA origin is visualized and normal. Vertebrobasilar junction and basilar artery is normal. Both posterior cerebral arteries originate from the basilar tip. Venous sinuses: The dural sinuses are patent. The straight sinus deep cerebral veins are intact. Cortical veins are within normal limits. No significant vascular malformation is evident. Anatomic variants: No significant anatomic variant Review of the MIP images confirms the above findings IMPRESSION: 1. 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 2. Tandem high-grade stenoses in the intracranial right vertebral artery. 3. No other significant stenosis in the neck. 4. No other significant proximal stenosis, aneurysm, or branch vessel occlusion within the Circle of Willis. 5. Aortic Atherosclerosis (ICD10-I70.0).  Electronically Signed   By: San Morelle M.D.   On: 08/03/2021 15:29   CT HEAD CODE STROKE WO CONTRAST`  Result Date: 08/03/2021 CLINICAL DATA:  Code stroke. Aphasia. Right-sided weakness. Symptoms began less than 1 hour ago. EXAM: CT HEAD WITHOUT CONTRAST TECHNIQUE: Contiguous axial images were obtained from the base of the  skull through the vertex without intravenous contrast. RADIATION DOSE REDUCTION: This exam was performed according to the departmental dose-optimization program which includes automated exposure control, adjustment of the mA and/or kV according to patient size and/or use of iterative reconstruction technique. COMPARISON:  07/31/2020 FINDINGS: Brain: Remote infarcts of the right lentiform nucleus and caudate head are noted. Ex vacuo dilation of the right lateral ventricle is associated. A remote lacunar infarct is present in the left lentiform nucleus. No acute infarct, hemorrhage, or mass lesion is present. Mild atrophy and white matter changes are otherwise stable. Vascular: Atherosclerotic calcifications are present within the cavernous internal carotid arteries. No hyperdense vessel is present. Skull: Calvarium is intact. No focal lytic or blastic lesions are present. No significant extracranial soft tissue lesion is present. Sinuses/Orbits: The paranasal sinuses and mastoid air cells are clear. The globes and orbits are within normal limits. Other: ASPECTS ([place] Stroke Program Early CT Score) - Ganglionic level infarction (caudate, lentiform nuclei, internal capsule, insula, M1-M3 cortex): 7/7 - Supraganglionic infarction (M4-M6 cortex): 3/3 Total score (0-10 with 10 being normal): 10/10 IMPRESSION: 1. Stable remote bilateral basal ganglia lacunar infarcts. 2. No acute intracranial abnormality 3. ASPECTS is 10/10 These results were called by telephone at the time of interpretation on 08/03/2021 at 2:39 pm to provider [facility] , who verbally acknowledged these  results. Electronically Signed   By: San Morelle M.D.   On: 08/03/2021 14:42     Assessment: # Acute ischemic stroke s/p TNK - Exam reveals *** - Pre-TNK CT head: Stable remote bilateral basal ganglia lacunar infarcts. No acute intracranial abnormality. ASPECTS is 10/10.  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 No other significant stenosis in the neck. No other significant proximal stenosis, aneurysm, or branch vessel occlusion within the Circle of Willis. Aortic atherosclerosis  - Cr 1.18. eGFR of 49  # Fever - Spiked a fever of 103 in the [facility] ED - May be secondary to infection, but WBC 10.2. Central fever due to stroke is also possible.  - Urine culture pending but most likely will come back negative as U/A was negative. - Blood cultures drawn with results pending. - Covid PCR negative - CXR shows no active disease  Plan: # Acute ischemic stroke s/p TNK -   # Fever - Blood cultures x 2 drawn at [facility] - One time dose of metronidazole given at [facility] - Continue cefepime 2 g IV Q24h x 6 days and vancomycin 1000 mg IV q48h x 5 days -    Electronically signed: Dr. Kerney Elbe 08/03/2021, 7:23 PM

## 2021-08-03 NOTE — ED Notes (Signed)
Our teleneurologist phones our EDP to inform us that it is ok to start peripheral IV and to please draw the second blood culture, which we do.

## 2021-08-03 NOTE — Progress Notes (Signed)
Called back by the ED provider. Patient presumably spiked a fever-temperature 103. I would recommend broad-spectrum antibiotic coverage UA and chest x-ray as advised before If there is no clear source, might broaden the antibiotic coverage to meningitic coverage and plan LP 24 hours after TNKase. We will follow.  -- Milon Dikes, MD Neurologist [facility] Pager: [phone]   Additional 10 min cc time

## 2021-08-03 NOTE — Progress Notes (Signed)
Pharmacy Antibiotic Note  Sherry Fitzgerald is a 76 y.o. female for which pharmacy has been consulted for cefepime and vancomycin dosing for sepsis.  Patient with a history of TN, stroke affecting rt basal ganglia w/ residual memory issues and requires 24/7 care taking. Patient presenting with confusion.  SCr 1.18 - baseline ~0.7-0.8 WBC 10.2; LA 1.5; T 103.1>102.3; HR 93>89; RR 23>25  Plan: Metronidazole per MD Cefepime 2g q24hr Vancomycin 1000 mg q48hr (eAUC 540.9) unless change in renal function Trend WBC, Fever, Renal function, & Clinical course F/u cultures, clinical course, WBC, fever De-escalate when able Levels at steady state  Weight: 52.2 kg (115 lb 1.3 oz)  Temp (24hrs), Avg:98 F (36.7 C), Min:98 F (36.7 C), Max:98 F (36.7 C)  Recent Labs  Lab 08/03/21 1518  WBC 10.2    CrCl cannot be calculated (Patient's most recent lab result is older than the maximum 21 days allowed.).    No Known Allergies  Antimicrobials this admission: cefepime 7/30 >>  flagyl 7/30 >>  vancomycin 7/30 >>   Microbiology results: Pending  Thank you for allowing pharmacy to be a part of this patient's care.  Delmar Landau, PharmD, BCPS 08/03/2021 3:38 PM ED Clinical Pharmacist -  [phone]

## 2021-08-03 NOTE — ED Notes (Signed)
To obtain a blood culture, we initiated an IV (22 Gauge) in her right hand. Blood return is slow. We do not perform any additional puntures of any kind d/t Tnk precautions. Pt. Remains awake, alert and in no distress. She continues to deny any pain or discomfort. Her husband remains with her.

## 2021-08-04 ENCOUNTER — Inpatient Hospital Stay (HOSPITAL_COMMUNITY): Payer: Medicare Other

## 2021-08-04 DIAGNOSIS — I639 Cerebral infarction, unspecified: Secondary | ICD-10-CM | POA: Diagnosis not present

## 2021-08-04 LAB — LIPID PANEL
Cholesterol: 97 mg/dL (ref 0–200)
HDL: 50 mg/dL (ref >40)
LDL Cholesterol: 38 mg/dL (ref 0–99)
Total CHOL/HDL Ratio: 1.9 RATIO
Triglycerides: 44 mg/dL (ref <150)
VLDL: 9 mg/dL (ref 0–40)

## 2021-08-04 LAB — BASIC METABOLIC PANEL
Anion gap: 4 — ABNORMAL LOW (ref 5–15)
BUN: 12 mg/dL (ref 8–23)
CO2: 26 mmol/L (ref 22–32)
Calcium: 8.1 mg/dL — ABNORMAL LOW (ref 8.9–10.3)
Chloride: 109 mmol/L (ref 98–111)
Creatinine, Ser: 0.81 mg/dL (ref 0.44–1.00)
GFR, Estimated: >60 mL/min (ref >60)
Glucose, Bld: 181 mg/dL — ABNORMAL HIGH (ref 70–99)
Potassium: 3.2 mmol/L — ABNORMAL LOW (ref 3.5–5.1)
Sodium: 139 mmol/L (ref 135–145)

## 2021-08-04 LAB — HEMOGLOBIN A1C
Hgb A1c MFr Bld: 6.1 % — ABNORMAL HIGH (ref 4.8–5.6)
Mean Plasma Glucose: 128.37 mg/dL

## 2021-08-04 LAB — CBC
HCT: 35.2 % — ABNORMAL LOW (ref 36.0–46.0)
Hemoglobin: 11.1 g/dL — ABNORMAL LOW (ref 12.0–15.0)
MCH: 23.5 pg — ABNORMAL LOW (ref 26.0–34.0)
MCHC: 31.5 g/dL (ref 30.0–36.0)
MCV: 74.6 fL — ABNORMAL LOW (ref 80.0–100.0)
Platelets: 180 10*3/uL (ref 150–400)
RBC: 4.72 MIL/uL (ref 3.87–5.11)
RDW: 14.4 % (ref 11.5–15.5)
WBC: 10 10*3/uL (ref 4.0–10.5)
nRBC: 0 % (ref 0.0–0.2)

## 2021-08-04 LAB — URINE CULTURE: Culture: <10000 — AB

## 2021-08-04 MED ORDER — POTASSIUM CHLORIDE CRYS ER 20 MEQ PO TBCR
40.0000 meq | EXTENDED_RELEASE_TABLET | ORAL | Status: AC
  Administered 2021-08-04 (×2): 40 meq via ORAL
  Filled 2021-08-04 (×2): qty 2

## 2021-08-04 MED ORDER — ASPIRIN 81 MG PO CHEW
81.0000 mg | CHEWABLE_TABLET | Freq: Every day | ORAL | Status: DC
  Administered 2021-08-04 – 2021-08-05 (×2): 81 mg via ORAL
  Filled 2021-08-04 (×2): qty 1

## 2021-08-04 NOTE — Progress Notes (Addendum)
STROKE TEAM PROGRESS NOTE   INTERVAL HISTORY Patient is seen in her room with her husband at the bedside.  Yesterday, she presented to the ED with sudden onset confusion, dysphasia and dysarthria with right facial droop and right sided weakness.  As symptoms were felt to be due to stroke, she was given TNK.  She has shown near complete recovery and is doing well.  Blood pressure adequately controlled.  She has prior history of strokes with some cognitive impairment as per husband who is her caregiver  Vitals:   08/04/21 1130 08/04/21 1200 08/04/21 1300 08/04/21 1400  BP:  (!) 109/54 (!) 114/101 106/68  Pulse:  97 99 89  Resp:  20 (!) 23 18  Temp: 99.7 F (37.6 C)     TempSrc: Axillary     SpO2:  94% 96% 97%  Weight:      Height:       CBC:  Recent Labs  Lab 08/03/21 1518 08/04/21 1308  WBC 10.2 10.0  NEUTROABS 9.1*  --   HGB 11.5* 11.1*  HCT 37.7 35.2*  MCV 76.3* 74.6*  PLT 203 180   Basic Metabolic Panel:  Recent Labs  Lab 08/03/21 1518 08/04/21 1308  NA 141 139  K 3.7 3.2*  CL 105 109  CO2 26 26  GLUCOSE 196* 181*  BUN 15 12  CREATININE 1.18* 0.81  CALCIUM 8.8* 8.1*   Lipid Panel:  Recent Labs  Lab 08/04/21 0204  CHOL 97  TRIG 44  HDL 50  CHOLHDL 1.9  VLDL 9  LDLCALC 38   HgbA1c:  Recent Labs  Lab 08/04/21 0204  HGBA1C 6.1*   Urine Drug Screen: No results for input(s): "LABOPIA", "COCAINSCRNUR", "LABBENZ", "AMPHETMU", "THCU", "LABBARB" in the last 168 hours.  Alcohol Level No results for input(s): "ETH" in the last 168 hours.  IMAGING past 24 hours No results found.  PHYSICAL EXAM General:  Alert, well-developed, well-nourished frail Asian lady   in no acute distress Respiratory:  Regular, unlabored respirations on room air  NEURO:  Mental Status: AA&Ox2  Speech/Language: speech is without dysarthria or aphasia.  Fluency, and comprehension intact.  Cranial Nerves:  II: PERRL. Visual fields full.  III, IV, VI: EOMI. Eyelids elevate  symmetrically.  V: Sensation is intact to light touch and symmetrical to face.  VII: Smile is symmetrical.  VIII: hearing intact to voice. IX, X: Phonation is normal.  UM:PNTIRWER shrug 5/5. XII: tongue is midline without fasciculations. Motor: 5/5 strength to all muscle groups tested.  Tone: is normal and bulk is normal Sensation- Intact to light touch bilaterally.  Coordination: FTN intact bilaterally.No drift.  Gait- deferred    ASSESSMENT/PLAN [name] is a 76 y.o. female with history of HTN, dementia and stroke with residual memory dysfunction presenting with  sudden onset confusion, dysphasia and dysarthria with right facial droop and right sided weakness.  As symptoms were felt to be due to stroke, she was given TNK.  Patient was febrile on admission and antibiotics were started, but WBC remains normal and temperature has returned to normal.  Stroke:  left MCA stroke vs. TIA s/p TNK Etiology: Likely small vessel disease Code Stroke CT head No acute abnormality.  ASPECTS 10.    CTA head & neck high grade stenosis of right vertebral artery MRI  pending 2D Echo pending LDL 38 HgbA1c 6.1 VTE prophylaxis - SCDs    Diet   Diet Heart Room service appropriate? Yes; Fluid consistency: Thin  aspirin 325 mg daily prior to admission, now on No antithrombotic as she is <24 hours from TNK. Therapy recommendations:  pending Disposition:  pending  Hypertension Home meds:  losartan 100 mg daily Stable Permissive hypertension (OK if < 220/120) but gradually normalize in 5-7 days Long-term BP goal normotensive  Hyperlipidemia Home meds:  atorvastatin 80 mg daily, resumed in hospital LDL 38, goal < 70 Continue statin at discharge   Other Stroke Risk Factors Advanced Age >/= 41  Hx stroke  Other Active Problems Fever Patient was febrile on admission Afebrile now [facility] WNL  Hospital day # 1  Cortney E Ernestina Columbia , MSN, AGACNP-BC [facility] See  Amion for schedule and pager information 08/04/2021 3:42 PM   STROKE MD NOTE :  I have personally obtained history,examined this patient, reviewed notes, independently viewed imaging studies, participated in medical decision making and plan of care.ROS completed by me personally and pertinent positives fully documented  I have made any additions or clarifications directly to the above note. Agree with note above.  She presented with confusion and slurred speech and right-sided weakness likely due to left hemispheric TIA versus small infarct and received thrombolysis with IV TNK with excellent clinical recovery.  Continue close neurological observation and strict blood pressure control as per postthrombolytic protocol.  Check MRI scan of the brain later today or CT scan if unable to tolerate.  Mobilize out of bed.  Therapy consults.  Will need to start dual antiplatelet therapy of aspirin and Plavix for 3 weeks followed by Plavix alone if no bleed noted on follow-up imaging.  Long discussion with patient and husband at the bedside and answered questions. This patient is critically ill and at significant risk of neurological worsening, death and care requires constant monitoring of vital signs, hemodynamics,respiratory and cardiac monitoring, extensive review of multiple databases, frequent neurological assessment, discussion with family, other specialists and medical decision making of high complexity.I have made any additions or clarifications directly to the above note.This critical care time does not reflect procedure time, or teaching time or supervisory time of PA/NP/Med Resident etc but could involve care discussion time.  I spent 30 minutes of neurocritical care time  in the care of  this patient.      Delia Heady, MD Medical Director [facility] Pager: [phone] 08/04/2021 4:08 PM   To contact Stroke Continuity provider, please refer to [email or web address]. After hours, contact General  Neurology

## 2021-08-04 NOTE — Progress Notes (Signed)
1330 Dr Pearlean Brownie notified that patient was in Atrial  fib. EKG obtained to confirm.

## 2021-08-04 NOTE — Progress Notes (Signed)
OT Cancellation Note  Patient Details Name: Sherry Fitzgerald MRN: [id number] DOB: 07-Jul-1945   Cancelled Treatment:    Reason Eval/Treat Not Completed: Active bedrest order (OT evaluation to f/u once activty orders progress.)  Jayme Mednick A [name] 08/04/2021, 7:11 AM

## 2021-08-04 NOTE — Evaluation (Signed)
Speech Language Pathology Evaluation Patient Details Name: Sherry Fitzgerald MRN: [id number] DOB: 26-Feb-1945 Today's Date: 08/04/2021 Time: 1040-1100 SLP Time Calculation (min) (ACUTE ONLY): 20 min  Problem List:  Patient Active Problem List   Diagnosis Date Noted   Stroke [facility]) 08/03/2021   Stroke (cerebrum) (HCC) 08/03/2021   Long term (current) use of anticoagulants 09/23/2017   CVA (cerebral vascular accident) (HCC) 09/15/2017   Hyperglycemia 09/15/2017   Health care maintenance 07/30/2014   Chest wall pain 07/27/2012   Right leg weakness 07/28/2011   Cough 02/09/2011   Knee pain, bilateral 01/21/2011   Right cervical radiculopathy at C5 11/12/2010   Left lumbar radiculopathy 10/27/2010   Mixed hyperlipidemia 03/04/2006   Essential hypertension 03/04/2006   Past Medical History:  Past Medical History:  Diagnosis Date   Cerebrovascular accident (CVA) (HCC)    Hypertension    no medications for years   Past Surgical History:  Past Surgical History:  Procedure Laterality Date   CHOLECYSTECTOMY  20186   HPI:  76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 Given TNK, MRI pending, CT negative.   Assessment / Plan / Recommendation Clinical Impression  Pt is alert, able to follow commands with some repetition needed, can name familiar objects, feed herself and call her husband on her phone. She was fluent with language in three languages. Her husband reports she is close to her baseline from a cognitive standpoint. He typically lays her clothes out for her and gives her reminders about bathing. She can wash  dishes, but they need to be done again. She is able to express her wants and needs and continues to converse fluently with family in her primary language, though her English has been simplified since her last stroke. Would not recommend any further SLP interventions given no signs of significant acute change and appropriate level of supervision and assist at home.    SLP Assessment  SLP Recommendation/Assessment: Patient does not need any further Speech Lanaguage Pathology Services SLP Visit Diagnosis: Cognitive communication deficit (R41.841)    Recommendations for follow up therapy are one component of a multi-disciplinary discharge planning process, led by the attending physician.  Recommendations may be updated based on patient status, additional functional criteria and insurance authorization.    Follow Up Recommendations  No SLP follow up    Assistance Recommended at Discharge     Functional Status Assessment    Frequency and Duration           SLP Evaluation Cognition  Overall Cognitive Status: History of cognitive impairments - at baseline Arousal/Alertness: Awake/alert Orientation Level: Oriented to person Attention: Focused;Sustained Memory: Impaired Memory Impairment: Storage deficit Awareness: Impaired Awareness Impairment: Intellectual impairment;Emergent impairment;Anticipatory impairment Problem Solving: Impaired Problem Solving Impairment: Verbal basic;Functional basic Behaviors: Restless Safety/Judgment: Impaired       Comprehension  Auditory Comprehension Overall Auditory Comprehension: Impaired Yes/No Questions: Within Functional Limits Commands: Impaired One Step Basic Commands: 25-49% accurate (needs repetition)    Expression Verbal Expression Overall Verbal Expression: Appears within functional limits for tasks assessed   Oral / Motor  Oral Motor/Sensory Function Overall Oral Motor/Sensory Function: Within functional limits Motor Speech Overall  Motor Speech:  Appears within functional limits for tasks assessed            Johntae Broxterman, Riley Nearing 08/04/2021, 11:54 AM

## 2021-08-04 NOTE — Progress Notes (Signed)
PT Cancellation Note  Patient Details Name: Sherry Fitzgerald MRN: [id number] DOB: April 01, 1945   Cancelled Treatment:    Reason Eval/Treat Not Completed: Active bedrest order  Will f/u when appropriate. Anise Salvo, PT Acute Rehab [facility] Rehab [phone]    Rayetta Humphrey 08/04/2021, 9:49 AM

## 2021-08-04 NOTE — Progress Notes (Signed)
Dr Pearlean Brownie notified at 1430 that MRI was down patient went for post stroke CT scan instead. See orders

## 2021-08-04 NOTE — Progress Notes (Signed)
Echo attempted. Patient not in room. Will attempt again as schedule permits. ?

## 2021-08-05 ENCOUNTER — Inpatient Hospital Stay (HOSPITAL_COMMUNITY): Payer: Medicare Other

## 2021-08-05 DIAGNOSIS — I639 Cerebral infarction, unspecified: Secondary | ICD-10-CM | POA: Diagnosis not present

## 2021-08-05 DIAGNOSIS — I6389 Other cerebral infarction: Secondary | ICD-10-CM | POA: Diagnosis not present

## 2021-08-05 DIAGNOSIS — I480 Paroxysmal atrial fibrillation: Secondary | ICD-10-CM

## 2021-08-05 LAB — ECHOCARDIOGRAM COMPLETE
AR max vel: 1.96 cm2
AV Area VTI: 2.04 cm2
AV Area mean vel: 1.86 cm2
AV Mean grad: 4 mmHg
AV Peak grad: 7.3 mmHg
Ao pk vel: 1.35 m/s
Area-P 1/2: 2.55 cm2
Height: 56 in
S' Lateral: 2.3 cm
Weight: 1841.28 oz

## 2021-08-05 LAB — BASIC METABOLIC PANEL
Anion gap: 10 (ref 5–15)
BUN: 10 mg/dL (ref 8–23)
CO2: 22 mmol/L (ref 22–32)
Calcium: 8.7 mg/dL — ABNORMAL LOW (ref 8.9–10.3)
Chloride: 110 mmol/L (ref 98–111)
Creatinine, Ser: 0.8 mg/dL (ref 0.44–1.00)
GFR, Estimated: >60 mL/min (ref >60)
Glucose, Bld: 132 mg/dL — ABNORMAL HIGH (ref 70–99)
Potassium: 5 mmol/L (ref 3.5–5.1)
Sodium: 142 mmol/L (ref 135–145)

## 2021-08-05 LAB — CBC
HCT: 35.9 % — ABNORMAL LOW (ref 36.0–46.0)
Hemoglobin: 10.8 g/dL — ABNORMAL LOW (ref 12.0–15.0)
MCH: 22.9 pg — ABNORMAL LOW (ref 26.0–34.0)
MCHC: 30.1 g/dL (ref 30.0–36.0)
MCV: 76.2 fL — ABNORMAL LOW (ref 80.0–100.0)
Platelets: 170 10*3/uL (ref 150–400)
RBC: 4.71 MIL/uL (ref 3.87–5.11)
RDW: 14.5 % (ref 11.5–15.5)
WBC: 10.1 10*3/uL (ref 4.0–10.5)
nRBC: 0 % (ref 0.0–0.2)

## 2021-08-05 MED ORDER — METOPROLOL TARTRATE 25 MG PO TABS: 25.0000 mg | ORAL_TABLET | Freq: Two times a day (BID) | ORAL | 0 refills | Status: DC

## 2021-08-05 MED ORDER — METOPROLOL TARTRATE 25 MG PO TABS: 25.0000 mg | ORAL_TABLET | Freq: Two times a day (BID) | ORAL | Status: DC

## 2021-08-05 MED ORDER — ASPIRIN 81 MG PO CHEW: 81.0000 mg | CHEWABLE_TABLET | Freq: Every day | ORAL | 0 refills | Status: DC

## 2021-08-05 MED ORDER — CLOPIDOGREL BISULFATE 75 MG PO TABS: 75.0000 mg | ORAL_TABLET | Freq: Every day | ORAL | 1 refills | Status: DC

## 2021-08-05 MED ORDER — APIXABAN 5 MG PO TABS: 5.0000 mg | ORAL_TABLET | Freq: Two times a day (BID) | ORAL | 1 refills | Status: AC

## 2021-08-05 MED ORDER — CLOPIDOGREL BISULFATE 75 MG PO TABS
75.0000 mg | ORAL_TABLET | Freq: Every day | ORAL | Status: DC
  Administered 2021-08-05: 75 mg via ORAL
  Filled 2021-08-05: qty 1

## 2021-08-05 MED ORDER — LOSARTAN POTASSIUM 100 MG PO TABS: 100.0000 mg | ORAL_TABLET | Freq: Every day | ORAL | 1 refills | Status: AC

## 2021-08-05 MED ORDER — APIXABAN 5 MG PO TABS: 5.0000 mg | ORAL_TABLET | Freq: Two times a day (BID) | ORAL | Status: DC

## 2021-08-05 NOTE — Discharge Summary (Addendum)
Stroke Discharge Summary  Patient ID: Sherry Fitzgerald   MRN: [id number]      DOB: May 16, 1945  Date of Admission: 08/03/2021 Date of Discharge: 08/05/2021  Attending Physician:  Stroke, Md, MD, Stroke MD Consultant(s):    cardiology  Patient's PCP:  Garlan Fillers, MD  DISCHARGE DIAGNOSIS: left hemispheric TIA versus aborted stroke s/p TNK secondary to cardiogenic embolism from new onset atrial fibrillation Principal Problem:   Stroke [facility]) Active Problems:   Stroke (cerebrum) (HCC) New onset atrial fibrillation Aphasia Right hemiparesis   Allergies as of 08/05/2021   No Known Allergies      Medication List     STOP taking these medications    aspirin 325 MG tablet       TAKE these medications    apixaban 5 MG Tabs tablet Commonly known as: ELIQUIS Take 1 tablet (5 mg total) by mouth 2 (two) times daily.   atorvastatin 80 MG tablet Commonly known as: LIPITOR Take 1 tablet (80 mg total) by mouth daily.   ezetimibe 10 MG tablet Commonly known as: ZETIA Take 10 mg by mouth daily.   losartan 100 MG tablet Commonly known as: COZAAR Take 1 tablet (100 mg total) by mouth daily. Start taking on: August 06, 2021 What changed:  See the new instructions. Another medication with the same name was removed. Continue taking this medication, and follow the directions you see here.   memantine 10 MG tablet Commonly known as: NAMENDA Take 1 tablet (10 mg total) by mouth 2 (two) times daily.   metoprolol tartrate 25 MG tablet Commonly known as: LOPRESSOR Take 1 tablet (25 mg total) by mouth 2 (two) times daily.        LABORATORY STUDIES CBC    Component Value Date/Time   WBC 10.1 08/05/2021 0231   RBC 4.71 08/05/2021 0231   HGB 10.8 (L) 08/05/2021 0231   HCT 35.9 (L) 08/05/2021 0231   PLT 170 08/05/2021 0231   MCV 76.2 (L) 08/05/2021 0231   MCH 22.9 (L) 08/05/2021 0231   MCHC 30.1 08/05/2021 0231   RDW 14.5 08/05/2021 0231   LYMPHSABS 0.5 (L)  08/03/2021 1518   MONOABS 0.5 08/03/2021 1518   EOSABS 0.0 08/03/2021 1518   BASOSABS 0.0 08/03/2021 1518   CMP    Component Value Date/Time   NA 142 08/05/2021 0231   K 5.0 08/05/2021 0231   CL 110 08/05/2021 0231   CO2 22 08/05/2021 0231   GLUCOSE 132 (H) 08/05/2021 0231   BUN 10 08/05/2021 0231   CREATININE 0.80 08/05/2021 0231   CREATININE 0.77 07/30/2014 1659   CALCIUM 8.7 (L) 08/05/2021 0231   PROT 7.3 08/03/2021 1518   ALBUMIN 4.2 08/03/2021 1518   AST 16 08/03/2021 1518   ALT 17 08/03/2021 1518   ALKPHOS 54 08/03/2021 1518   BILITOT 0.6 08/03/2021 1518   GFRNONAA >60 08/05/2021 0231   GFRNONAA 81 07/30/2014 1659   GFRAA >60 09/17/2017 0430   GFRAA >89 07/30/2014 1659   COAGS Lab Results  Component Value Date   INR 2.2 11/22/2017   INR 2.9 11/01/2017   INR 2.8 10/18/2017   Lipid Panel    Component Value Date/Time   CHOL 97 08/04/2021 0204   CHOL 150 06/07/2019 0850   TRIG 44 08/04/2021 0204   HDL 50 08/04/2021 0204   HDL 66 06/07/2019 0850   CHOLHDL 1.9 08/04/2021 0204   VLDL 9 08/04/2021 0204   LDLCALC 38 08/04/2021 7425  LDLCALC 69 06/07/2019 0850   HgbA1C  Lab Results  Component Value Date   HGBA1C 6.1 (H) 08/04/2021   Urinalysis    Component Value Date/Time   COLORURINE YELLOW 08/03/2021 1519   APPEARANCEUR CLEAR 08/03/2021 1519   LABSPEC >1.046 (H) 08/03/2021 1519   PHURINE 5.5 08/03/2021 1519   GLUCOSEU NEGATIVE 08/03/2021 1519   HGBUR NEGATIVE 08/03/2021 1519   BILIRUBINUR NEGATIVE 08/03/2021 1519   KETONESUR NEGATIVE 08/03/2021 1519   PROTEINUR 30 (A) 08/03/2021 1519   NITRITE NEGATIVE 08/03/2021 1519   LEUKOCYTESUR NEGATIVE 08/03/2021 1519   Urine Drug Screen No results found for: "LABOPIA", "COCAINSCRNUR", "LABBENZ", "AMPHETMU", "THCU", "LABBARB"  Alcohol Level No results found for: "ETH"   SIGNIFICANT DIAGNOSTIC STUDIES ECHOCARDIOGRAM COMPLETE  Result Date: 08/05/2021    ECHOCARDIOGRAM REPORT   Patient Name:   Sherry Fitzgerald  NASH Date of Exam: 08/05/2021 Medical Rec #:  [id number]       Height:       56.0 in Accession #:    [id number]      Weight:       115.1 lb Date of Birth:  02/14/1945      BSA:          1.404 m Patient Age:    76 years        BP:           141/53 mmHg Patient Gender: F               HR:           62 bpm. Exam Location:  Inpatient Procedure: 2D Echo, Cardiac Doppler and Color Doppler Indications:    Stroke  History:        Patient has prior history of Echocardiogram examinations, most                 recent 09/16/2017. Risk Factors:Dyslipidemia and Hypertension.  Sonographer:    Clayton Lefort RDCS (AE) Referring Phys: [place]  1. Left ventricular ejection fraction, by estimation, is 60 to 65%. The left ventricle has normal function. The left ventricle has no regional wall motion abnormalities. There is mild concentric left ventricular hypertrophy of the basal-septal segment. Left ventricular diastolic parameters are indeterminate. Elevated left atrial pressure.  2. Right ventricular systolic function is normal. The right ventricular size is normal. Tricuspid regurgitation signal is inadequate for assessing PA pressure.  3. Left atrial size was moderately dilated.  4. The mitral valve is normal in structure. Mild mitral valve regurgitation. No evidence of mitral stenosis.  5. The aortic valve is tricuspid. Aortic valve regurgitation is not visualized. Aortic valve sclerosis is present, with no evidence of aortic valve stenosis.  6. The inferior vena cava is normal in size with greater than 50% respiratory variability, suggesting right atrial pressure of 3 mmHg. Comparison(s): No significant change from prior study. FINDINGS  Left Ventricle: Left ventricular ejection fraction, by estimation, is 60 to 65%. The left ventricle has normal function. The left ventricle has no regional wall motion abnormalities. The left ventricular internal cavity size was normal in size. There is  mild concentric left  ventricular hypertrophy of the basal-septal segment. Left ventricular diastolic parameters are indeterminate. Elevated left atrial pressure. Right Ventricle: The right ventricular size is normal. No increase in right ventricular wall thickness. Right ventricular systolic function is normal. Tricuspid regurgitation signal is inadequate for assessing PA pressure. Left Atrium: Left atrial size was moderately dilated. Right Atrium: Right atrial size was normal  in size. Pericardium: There is no evidence of pericardial effusion. Mitral Valve: The mitral valve is normal in structure. Mild mitral annular calcification. Mild mitral valve regurgitation. No evidence of mitral valve stenosis. Tricuspid Valve: The tricuspid valve is normal in structure. Tricuspid valve regurgitation is trivial. No evidence of tricuspid stenosis. Aortic Valve: The aortic valve is tricuspid. There is moderate aortic valve annular calcification. Aortic valve regurgitation is not visualized. Aortic valve sclerosis is present, with no evidence of aortic valve stenosis. Aortic valve mean gradient measures 4.0 mmHg. Aortic valve peak gradient measures 7.3 mmHg. Aortic valve area, by VTI measures 2.04 cm. Pulmonic Valve: The pulmonic valve was normal in structure. Pulmonic valve regurgitation is not visualized. No evidence of pulmonic stenosis. Aorta: The aortic root, ascending aorta and aortic arch are all structurally normal, with no evidence of dilitation or obstruction. Venous: The inferior vena cava is normal in size with greater than 50% respiratory variability, suggesting right atrial pressure of 3 mmHg. IAS/Shunts: No atrial level shunt detected by color flow Doppler.  LEFT VENTRICLE PLAX 2D LVIDd:         3.80 cm   Diastology LVIDs:         2.30 cm   LV e' medial:    6.42 cm/s LV PW:         1.20 cm   LV E/e' medial:  14.5 LV IVS:        0.90 cm   LV e' lateral:   8.27 cm/s LVOT diam:     1.90 cm   LV E/e' lateral: 11.2 LV SV:         62 LV SV  Index:   44 LVOT Area:     2.84 cm  RIGHT VENTRICLE             IVC RV Basal diam:  2.70 cm     IVC diam: 1.60 cm RV S prime:     12.90 cm/s TAPSE (M-mode): 1.7 cm LEFT ATRIUM             Index        RIGHT ATRIUM           Index LA diam:        2.50 cm 1.78 cm/m   RA Area:     16.50 cm LA Vol (A2C):   73.2 ml 52.15 ml/m  RA Volume:   44.10 ml  31.42 ml/m LA Vol (A4C):   40.1 ml 28.57 ml/m LA Biplane Vol: 58.7 ml 41.82 ml/m  AORTIC VALVE AV Area (Vmax):    1.96 cm AV Area (Vmean):   1.86 cm AV Area (VTI):     2.04 cm AV Vmax:           135.00 cm/s AV Vmean:          92.000 cm/s AV VTI:            0.303 m AV Peak Grad:      7.3 mmHg AV Mean Grad:      4.0 mmHg LVOT Vmax:         93.30 cm/s LVOT Vmean:        60.300 cm/s LVOT VTI:          0.218 m LVOT/AV VTI ratio: 0.72  AORTA Ao Root diam: 3.00 cm Ao Asc diam:  2.60 cm MITRAL VALVE MV Area (PHT): 2.55 cm    SHUNTS MV Decel Time: 298 msec    Systemic VTI:  0.22 m MV  E velocity: 92.80 cm/s  Systemic Diam: 1.90 cm MV A velocity: 92.00 cm/s MV E/A ratio:  1.01 Riley Lam MD Electronically signed by Riley Lam MD Signature Date/Time: 08/05/2021/11:08:42 AM    Final    MR BRAIN WO CONTRAST  Result Date: 08/05/2021 CLINICAL DATA:  Stroke follow-up. Residual memory dysfunction. Sudden onset of confusion EXAM: MRI HEAD WITHOUT CONTRAST TECHNIQUE: Multiplanar, multiecho pulse sequences of the brain and surrounding structures were obtained without intravenous contrast. COMPARISON:  Head CT from yesterday FINDINGS: Brain: No acute infarction, hemorrhage, hydrocephalus, extra-axial collection or mass lesion. Chronic small vessel ischemia in the hemispheric white matter that is moderately extensive. Remote perforator infarct affecting the right basal ganglia. Mild cerebral volume loss. Vascular: Major flow voids are preserved Skull and upper cervical spine: No focal marrow lesion Sinuses/Orbits: Essentially clear sinuses and negative orbits.  IMPRESSION: 1. No acute or reversible finding. 2. Chronic small vessel ischemia including remote basal ganglia infarct on the right. Electronically Signed   By: Tiburcio Pea M.D.   On: 08/05/2021 04:56   CT HEAD WO CONTRAST ( )  Result Date: 08/04/2021 CLINICAL DATA:  24 hours post stroke code EXAM: CT HEAD WITHOUT CONTRAST TECHNIQUE: Contiguous axial images were obtained from the base of the skull through the vertex without intravenous contrast. RADIATION DOSE REDUCTION: This exam was performed according to the departmental dose-optimization program which includes automated exposure control, adjustment of the mA and/or kV according to patient size and/or use of iterative reconstruction technique. COMPARISON:  08/03/2021 FINDINGS: Brain: No significant interval change compared to 08/03/2021. No evidence of acute infarction, hemorrhage, mass, mass effect, or midline shift. No hydrocephalus or extra-axial fluid collection. Redemonstrated remote infarcts involving the right lentiform nucleus and caudate head with associated ex vacuo dilatation of the right frontal horn. Vascular: No hyperdense vessel. Atherosclerotic calcifications in the intracranial carotid and vertebral arteries. Skull: Normal. Negative for fracture or focal lesion. Sinuses/Orbits: No acute finding. Other: The mastoid air cells are well aerated. IMPRESSION: No acute intracranial process. No developing hypodensity to suggest interval infarct. Electronically Signed   By: Wiliam Ke M.D.   On: 08/04/2021 15:39   DG Chest Portable 1 View  Result Date: 08/03/2021 CLINICAL DATA:  Fever. EXAM: PORTABLE CHEST 1 VIEW COMPARISON:  Chest x-ray 02/09/2011 FINDINGS: The heart size and mediastinal contours are within normal limits. Both lungs are clear. No acute fractures are seen. There is a healed left clavicular fracture. IMPRESSION: No active disease. Electronically Signed   By: Darliss Cheney M.D.   On: 08/03/2021 15:41   CT ANGIO HEAD NECK  W WO CM (CODE STROKE)  Result Date: 08/03/2021 CLINICAL DATA:  Acute onset of aphasia and right-sided weakness. EXAM: CT ANGIOGRAPHY HEAD AND NECK TECHNIQUE: Multidetector CT imaging of the head and neck was performed using the standard protocol during bolus administration of intravenous contrast. Multiplanar CT image reconstructions and MIPs were obtained to evaluate the vascular anatomy. Carotid stenosis measurements (when applicable) are obtained utilizing NASCET criteria, using the distal internal carotid diameter as the denominator. RADIATION DOSE REDUCTION: This exam was performed according to the departmental dose-optimization program which includes automated exposure control, adjustment of the mA and/or kV according to patient size and/or use of iterative reconstruction technique. CONTRAST:  6mL OMNIPAQUE IOHEXOL 350 MG/ML SOLN COMPARISON:  CT head without contrast 08/03/2021. CTA head and neck 11/19/2017 FINDINGS: CTA NECK FINDINGS Aortic arch: Minimal atherosclerotic changes are present in the distal arch. Great vessel origins scratched at minimal calcification present at the  left subclavian origin, stable. Great vessel origins are otherwise within normal limits. No significant stenosis or aneurysm is present. Right carotid system: Right common carotid artery demonstrates stable mild noncalcified mural changes distally. Mild atherosclerotic changes are again noted the right carotid bifurcation without significant stenosis. Cervical right ICA is otherwise normal. Left carotid system: The left common carotid artery is within normal limits. Atherosclerotic changes are present at the bifurcation without a significant stenosis. Mild tortuosity of the cervical left ICA is stable without significant stenosis. Vertebral arteries: The left vertebral artery is the dominant vessel. Both vertebral arteries originate from the subclavian arteries. High-grade stenosis is present at the origin of the hypoplastic right  vertebral artery. No other stenoses are present in the neck. Skeleton: Vertebral body heights and alignment are normal. No focal osseous lesions are present. Other neck: Soft tissues the neck are otherwise unremarkable. Salivary glands are within normal limits. Thyroid is normal. No significant adenopathy is present. No focal mucosal or submucosal lesions are present. Upper chest: The lung apices are clear. Thoracic inlet is within normal limits. Review of the MIP images confirms the above findings CTA HEAD FINDINGS Anterior circulation: Atherosclerotic calcifications are again seen within the cavernous internal carotid arteries bilaterally without a significant stenosis relative to the distal vessel. ICA termini are within normal limits bilaterally. The A1 and M1 segments are normal. The anterior communicating artery is patent. The MCA bifurcations are within normal limits bilaterally. The ACA and MCA branch vessels are within normal limits bilaterally. Posterior circulation: Tandem high-grade stenoses are present in the right vertebral artery. Left PICA origin is visualized and normal. Vertebrobasilar junction and basilar artery is normal. Both posterior cerebral arteries originate from the basilar tip. Venous sinuses: The dural sinuses are patent. The straight sinus deep cerebral veins are intact. Cortical veins are within normal limits. No significant vascular malformation is evident. Anatomic variants: No significant anatomic variant Review of the MIP images confirms the above findings IMPRESSION: 1. 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 2. Tandem high-grade stenoses in the intracranial right vertebral artery. 3. No other significant stenosis in the neck. 4. No other significant proximal stenosis, aneurysm, or branch vessel occlusion within the Circle of Willis. 5. Aortic Atherosclerosis (ICD10-I70.0). Electronically Signed   By: San Morelle M.D.   On: 08/03/2021  15:29   CT HEAD CODE STROKE WO CONTRAST`  Result Date: 08/03/2021 CLINICAL DATA:  Code stroke. Aphasia. Right-sided weakness. Symptoms began less than 1 hour ago. EXAM: CT HEAD WITHOUT CONTRAST TECHNIQUE: Contiguous axial images were obtained from the base of the skull through the vertex without intravenous contrast. RADIATION DOSE REDUCTION: This exam was performed according to the departmental dose-optimization program which includes automated exposure control, adjustment of the mA and/or kV according to patient size and/or use of iterative reconstruction technique. COMPARISON:  07/31/2020 FINDINGS: Brain: Remote infarcts of the right lentiform nucleus and caudate head are noted. Ex vacuo dilation of the right lateral ventricle is associated. A remote lacunar infarct is present in the left lentiform nucleus. No acute infarct, hemorrhage, or mass lesion is present. Mild atrophy and white matter changes are otherwise stable. Vascular: Atherosclerotic calcifications are present within the cavernous internal carotid arteries. No hyperdense vessel is present. Skull: Calvarium is intact. No focal lytic or blastic lesions are present. No significant extracranial soft tissue lesion is present. Sinuses/Orbits: The paranasal sinuses and mastoid air cells are clear. The globes and orbits are within normal limits. Other: ASPECTS [facility] Stroke  Program Early CT Score) - Ganglionic level infarction (caudate, lentiform nuclei, internal capsule, insula, M1-M3 cortex): 7/7 - Supraganglionic infarction (M4-M6 cortex): 3/3 Total score (0-10 with 10 being normal): 10/10 IMPRESSION: 1. Stable remote bilateral basal ganglia lacunar infarcts. 2. No acute intracranial abnormality 3. ASPECTS is 10/10 These results were called by telephone at the time of interpretation on 08/03/2021 at 2:39 pm to provider [facility] , who verbally acknowledged these results. Electronically Signed   By: San Morelle M.D.   On:  08/03/2021 14:42      HISTORY OF PRESENT ILLNESS Patient with a history of dementia, HTN and stroke presented with acute onset confusion and dysarthria with right sided facial droop and right sided weakness.   HOSPITAL COURSE Patient was given TNK to treat stroke and symptoms improved.  MRI show no acute abnormalities, indicating that patient had either a TIA or small stroke that was aborted by the TNK.  Patient did have a fever on admission, but UA and CXR were negative and WBC remained normal.  Antibiotics were administered and fever resolved.  She was found to have new onset atrial fibrillation on the monitor.  Cardiology was consulted and patient was started on Eliquis for anticoagulation.  Stroke:  left MCA stroke not visualized on MRI vs. TIA s/p TNK Etiology: Likely new onset atrial fibrillation  code Stroke CT head No acute abnormality.  ASPECTS 10.    CTA head & neck high grade stenosis of right vertebral artery MRI  no acute abnormalities 2D Echo EF 60-65%, mild concentric LVH of basal septal segment, moderately dilated left atrium, no atrial level shunt LDL 38 HgbA1c 6.1 VTE prophylaxis - SCDs aspirin 325 mg daily prior to admission, now on Eliquis 5 mg BID for A-fib Therapy recommendations:  no PT/OT follow up Disposition:  home   Hypertension Home meds:  losartan 100 mg daily Stable Permissive hypertension (OK if < 220/120) but gradually normalize in 5-7 days Long-term BP goal normotensive   Hyperlipidemia Home meds:  atorvastatin 80 mg daily, resumed in hospital LDL 38, goal < 70 Continue statin at discharge     Other Stroke Risk Factors Advanced Age >/= 1  Hx stroke  RN Pressure Injury Documentation:     DISCHARGE EXAM Blood pressure (!) 148/61, pulse 64, temperature 98 F (36.7 C), resp. rate 18, height 4\' 8"  (1.422 m), weight 52.2 kg, SpO2 100 %.  General:  Alert, well-developed, well-nourished frail Asian lady   in no acute distress Respiratory:   Regular, unlabored respirations on room air   NEURO:  Mental Status: AA&Ox2  Speech/Language: speech is without dysarthria or aphasia.  Fluency, and comprehension intact.   Cranial Nerves:  II: PERRL. Visual fields full.  III, IV, VI: EOMI. Eyelids elevate symmetrically.  V: Sensation is intact to light touch and symmetrical to face.  VII: Smile is symmetrical.  VIII: hearing intact to voice. IX, X: Phonation is normal.  XII: tongue is midline without fasciculations. Motor: 5/5 strength to all muscle groups tested.  Tone: is normal and bulk is normal Sensation- Intact to light touch bilaterally.  Coordination: FTN intact bilaterally.No drift.  Gait- deferred  Discharge Diet       Diet   Diet Heart Room service appropriate? Yes; Fluid consistency: Thin   liquids  DISCHARGE PLAN Disposition:  home  Eliquis (apixaban) daily for secondary stroke prevention Ongoing stroke risk factor control by Primary Care Physician at time of discharge Follow-up PCP Donnajean Lopes, MD in 2 weeks.  Follow-up in [facility] in 8 weeks, office to schedule an appointment.   35 minutes were spent preparing discharge.  [place] , MSN, AGACNP-BC [facility] See Amion for schedule and pager information 08/05/2021 5:33 PM   I have personally obtained history,examined this patient, reviewed notes, independently viewed imaging studies, participated in medical decision making and plan of care.ROS completed by me personally and pertinent positives fully documented  I have made any additions or clarifications directly to the above note. Agree with note above.    Antony Contras, MD Medical Director [facility] Pager: [phone] 08/05/2021 6:00 PM

## 2021-08-05 NOTE — Consult Note (Signed)
Cardiology Consultation:   Patient ID: Sherry Fitzgerald MRN: [id number]; DOB: 17-Oct-1945  Admit date: 08/03/2021 Date of Consult: 08/05/2021  PCP:  Donnajean Lopes, MD   [facility] Providers Cardiologist:  Pixie Casino, MD     Patient Profile:   Sherry Fitzgerald is a 76 y.o. female with a hx of stroke with residual memory dysfunction, HTN, HLD who is being seen 08/05/2021 for the evaluation of atrial fibrillation at the request of  Dr. Leonie Man.  History of Present Illness:   Sherry Fitzgerald is a 76 year old female with above medical history. Per chart review, patient was briefly seen by Dr. Debara Pickett with [facility] in 2020. Patient was referred to cardiology after she had a stroke in fall 2019. Echocardiogram completed as part of her stroke work up in 09/2017 showed EF 60-65%, no regional wall motion abnormalities, grade I diastolic dysfunction, moderately dilated LA. Appears that a cardia monitor was ordered for further evaluation for possible atrial fibrillation, but it was not completed.   Patient presented to the [facility] ED on 7/30 as a code stroke. Husband reported that about 15 minutes prior to arrival, patient had lost the ability to utilize her usual computer Ipad, became confused, had difficulty speaking, and developed balance disturbances. TPA was administered at an outside hospital. CTA head and neck showed high-grade stenosis at the origin of the hypoplastic right vertebral artery, tandem high-grade stenoses in the intracranial right vertebral artery.   After receiving TPA, patient spiked a fever with temperature 103 F. Was started on broad-spectrum antibiotics. Fever improved, WBC within normal limits   EKG from 7/31 showed atrial fibrillation, HR 88 BPM.  Echocardiogram from 08/05/21 showed EF 60-65%, mild LVH, normal RV systolic function, moderately dilated LA.   On interview,   Past Medical History:  Diagnosis Date   Cerebrovascular accident (CVA) ([place])    Hypertension     no medications for years    Past Surgical History:  Procedure Laterality Date   CHOLECYSTECTOMY  2014     Home Medications:  Prior to Admission medications   Medication Sig Start Date End Date Taking? Authorizing Provider  aspirin 325 MG tablet Take 325 mg by mouth daily.   Yes [provider]  atorvastatin (LIPITOR) 80 MG tablet Take 1 tablet (80 mg total) by mouth daily. 11/08/17  Yes Guadalupe Dawn, MD  ezetimibe (ZETIA) 10 MG tablet Take 10 mg by mouth daily. 06/09/21  Yes [provider]  losartan (COZAAR) 50 MG tablet Take 50 mg by mouth daily. 07/24/21  Yes [provider]  memantine (NAMENDA) 10 MG tablet Take 1 tablet (10 mg total) by mouth 2 (two) times daily. 12/09/20  Yes Frann Rider, NP  aspirin 81 MG chewable tablet Chew 1 tablet (81 mg total) by mouth daily. 08/06/21   de Yolanda Manges, Cortney E, NP  clopidogrel (PLAVIX) 75 MG tablet Take 1 tablet (75 mg total) by mouth daily. 08/05/21   de Yolanda Manges, Cortney E, NP  losartan (COZAAR) 100 MG tablet TAKE 1 TABLET (100 MG TOTAL) BY MOUTH DAILY. NEED OFFICE VISIT Patient not taking: Reported on 08/03/2021 06/21/20   Pixie Casino, MD  losartan (COZAAR) 100 MG tablet Take 1 tablet (100 mg total) by mouth daily. 08/06/21   de Ashley Murrain, NP    Inpatient Medications: Scheduled Meds:  aspirin  81 mg Oral Daily   atorvastatin  80 mg Oral Daily   Chlorhexidine Gluconate Cloth  6 each Topical Q0600   clopidogrel  75 mg Oral Daily   labetalol  20 mg Intravenous Once   losartan  100 mg Oral Daily   pantoprazole (PROTONIX) IV  40 mg Intravenous QHS   Continuous Infusions:  ceFEPime (MAXIPIME) IV 2 g (08/04/21 1610)   clevidipine     vancomycin     PRN Meds: acetaminophen **OR** acetaminophen (TYLENOL) oral liquid 160 mg/5 mL **OR** acetaminophen, senna-docusate  Allergies:   No Known Allergies  Social History:   Social History   Socioeconomic History   Marital status: Married    Spouse name:  Not on file   Number of children: Not on file   Years of education: Not on file   Highest education level: Not on file  Occupational History   Occupation: retired  Tobacco Use   Smoking status: Never   Smokeless tobacco: Never  Substance and Sexual Activity   Alcohol use: Never   Drug use: Never   Sexual activity: Not on file  Other Topics Concern   Not on file  Social History Narrative   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  Lives alone, but has 5 children in the area who are grown and supportive.  +Financial difficulties - trouble paying bills.  She has been married to her current husband for 3 years.   Social Determinants of Health   Financial Resource Strain: Not on file  Food Insecurity: Not on file  Transportation Needs: Not on file  Physical Activity: Not on file  Stress: Not on file  Social Connections: Not on file  Intimate Partner Violence: Not on file    Family History:    Family History  Problem Relation Age of Onset   CVA Mother    Heart disease Father 55   CVA Father      ROS:  Please see the history of present illness.   All other ROS reviewed and negative.     Physical Exam/Data:   Vitals:   08/04/21 2349 08/05/21 0517 08/05/21 0752 08/05/21 1121  BP: 130/68 (!) 141/53 (!) 156/71 (!) 148/61  Pulse: 71 65 68 64  Resp: 18 16 18 18   Temp: 98.3 F (36.8 C) 98.5 F (36.9 C) (!) 97.4 F (36.3 C) 98 F (36.7 C)  TempSrc: Oral Oral Oral   SpO2: 100% 99% 100% 100%  Weight:      Height:       No intake or output data in the 24 hours ending 08/05/21 1706    08/03/2021    2:07 PM 12/09/2020    8:28 AM 12/07/2019    8:36 AM  Last 3 Weights  Weight (lbs) 115 lb 1.3 oz 115 lb 105 lb  Weight (kg) 52.2 kg 52.164 kg 47.628 kg     Body mass index is 25.8 kg/m.  General:  Elderly female in no acute distress, resting comfortably on the bed  HEENT: normal Neck: no JVD Vascular: Radial pulses 2+ bilaterally Cardiac:  normal S1, S2; RRR; no murmur  Lungs:   clear to auscultation bilaterally, no wheezing, rhonchi or rales  Abd: soft, nontender  Ext: no edema Musculoskeletal:  No deformities, BUE and BLE strength normal and equal Skin: warm and dry  Neuro:  CNs 2-12 intact, no focal abnormalities noted Psych:  Normal affect   EKG:  The EKG was personally reviewed and demonstrates:  Atrial Fibrillation, HR 88 BPM Telemetry:  Telemetry was personally reviewed and demonstrates:  not on telemetry   Relevant CV Studies:  Echocardiogram 08/05/21  1. Left ventricular ejection fraction, by estimation, is 60 to 65%. The  left ventricle has normal function. The left ventricle has no regional  wall motion abnormalities. There is mild concentric left ventricular  hypertrophy of the basal-septal segment.  Left ventricular diastolic parameters are indeterminate. Elevated left  atrial pressure.   2. Right ventricular systolic function is normal. The right ventricular  size is normal. Tricuspid regurgitation signal is inadequate for assessing  PA pressure.   3. Left atrial size was moderately dilated.   4. The mitral valve is normal in structure. Mild mitral valve  regurgitation. No evidence of mitral stenosis.   5. The aortic valve is tricuspid. Aortic valve regurgitation is not  visualized. Aortic valve sclerosis is present, with no evidence of aortic  valve stenosis.   6. The inferior vena cava is normal in size with greater than 50%  respiratory variability, suggesting right atrial pressure of 3 mmHg.   Comparison(s): No significant change from prior study.  Laboratory Data:  High Sensitivity Troponin:  No results for input(s): "TROPONINIHS" in the last 720 hours.   Chemistry Recent Labs  Lab 08/03/21 1518 08/04/21 1308 08/05/21 0231  NA 141 139 142  K 3.7 3.2* 5.0  CL 105 109 110  CO2 26 26 22   GLUCOSE 196* 181* 132*  BUN 15 12 10   CREATININE 1.18* 0.81 0.80  CALCIUM 8.8* 8.1* 8.7*  GFRNONAA 49* >60 >60  ANIONGAP 10 4* 10     Recent Labs  Lab 08/03/21 1518  PROT 7.3  ALBUMIN 4.2  AST 16  ALT 17  ALKPHOS 54  BILITOT 0.6   Lipids  Recent Labs  Lab 08/04/21 0204  CHOL 97  TRIG 44  HDL 50  LDLCALC 38  CHOLHDL 1.9    Hematology Recent Labs  Lab 08/03/21 1518 08/04/21 1308 08/05/21 0231  WBC 10.2 10.0 10.1  RBC 4.94 4.72 4.71  HGB 11.5* 11.1* 10.8*  HCT 37.7 35.2* 35.9*  MCV 76.3* 74.6* 76.2*  MCH 23.3* 23.5* 22.9*  MCHC 30.5 31.5 30.1  RDW 14.5 14.4 14.5  PLT 203 180 170   Thyroid No results for input(s): "TSH", "FREET4" in the last 168 hours.  BNPNo results for input(s): "BNP", "PROBNP" in the last 168 hours.  DDimer No results for input(s): "DDIMER" in the last 168 hours.   Radiology/Studies:  ECHOCARDIOGRAM COMPLETE  Result Date: 08/05/2021    ECHOCARDIOGRAM REPORT   Patient Name:   Sherry Fitzgerald Date of Exam: 08/05/2021 Medical Rec #:  [id number]       Height:       56.0 in Accession #:    [id number]      Weight:       115.1 lb Date of Birth:  [birth year]      BSA:          1.404 m Patient Age:    74 years        BP:           141/53 mmHg Patient Gender: F               HR:           62 bpm. Exam Location:  Inpatient Procedure: 2D Echo, Cardiac Doppler and Color Doppler Indications:    Stroke  History:        Patient has prior history of Echocardiogram examinations, most  recent 09/16/2017. Risk Factors:Dyslipidemia and Hypertension.  Sonographer:    Clayton Lefort RDCS (AE) Referring Phys: [place]  1. Left ventricular ejection fraction, by estimation, is 60 to 65%. The left ventricle has normal function. The left ventricle has no regional wall motion abnormalities. There is mild concentric left ventricular hypertrophy of the basal-septal segment. Left ventricular diastolic parameters are indeterminate. Elevated left atrial pressure.  2. Right ventricular systolic function is normal. The right ventricular size is normal. Tricuspid regurgitation signal is  inadequate for assessing PA pressure.  3. Left atrial size was moderately dilated.  4. The mitral valve is normal in structure. Mild mitral valve regurgitation. No evidence of mitral stenosis.  5. The aortic valve is tricuspid. Aortic valve regurgitation is not visualized. Aortic valve sclerosis is present, with no evidence of aortic valve stenosis.  6. The inferior vena cava is normal in size with greater than 50% respiratory variability, suggesting right atrial pressure of 3 mmHg. Comparison(s): No significant change from prior study. FINDINGS  Left Ventricle: Left ventricular ejection fraction, by estimation, is 60 to 65%. The left ventricle has normal function. The left ventricle has no regional wall motion abnormalities. The left ventricular internal cavity size was normal in size. There is  mild concentric left ventricular hypertrophy of the basal-septal segment. Left ventricular diastolic parameters are indeterminate. Elevated left atrial pressure. Right Ventricle: The right ventricular size is normal. No increase in right ventricular wall thickness. Right ventricular systolic function is normal. Tricuspid regurgitation signal is inadequate for assessing PA pressure. Left Atrium: Left atrial size was moderately dilated. Right Atrium: Right atrial size was normal in size. Pericardium: There is no evidence of pericardial effusion. Mitral Valve: The mitral valve is normal in structure. Mild mitral annular calcification. Mild mitral valve regurgitation. No evidence of mitral valve stenosis. Tricuspid Valve: The tricuspid valve is normal in structure. Tricuspid valve regurgitation is trivial. No evidence of tricuspid stenosis. Aortic Valve: The aortic valve is tricuspid. There is moderate aortic valve annular calcification. Aortic valve regurgitation is not visualized. Aortic valve sclerosis is present, with no evidence of aortic valve stenosis. Aortic valve mean gradient measures 4.0 mmHg. Aortic valve peak  gradient measures 7.3 mmHg. Aortic valve area, by VTI measures 2.04 cm. Pulmonic Valve: The pulmonic valve was normal in structure. Pulmonic valve regurgitation is not visualized. No evidence of pulmonic stenosis. Aorta: The aortic root, ascending aorta and aortic arch are all structurally normal, with no evidence of dilitation or obstruction. Venous: The inferior vena cava is normal in size with greater than 50% respiratory variability, suggesting right atrial pressure of 3 mmHg. IAS/Shunts: No atrial level shunt detected by color flow Doppler.  LEFT VENTRICLE PLAX 2D LVIDd:         3.80 cm   Diastology LVIDs:         2.30 cm   LV e' medial:    6.42 cm/s LV PW:         1.20 cm   LV E/e' medial:  14.5 LV IVS:        0.90 cm   LV e' lateral:   8.27 cm/s LVOT diam:     1.90 cm   LV E/e' lateral: 11.2 LV SV:         62 LV SV Index:   44 LVOT Area:     2.84 cm  RIGHT VENTRICLE             IVC RV Basal diam:  2.70 cm  IVC diam: 1.60 cm RV S prime:     12.90 cm/s TAPSE (M-mode): 1.7 cm LEFT ATRIUM             Index        RIGHT ATRIUM           Index LA diam:        2.50 cm 1.78 cm/m   RA Area:     16.50 cm LA Vol (A2C):   73.2 ml 52.15 ml/m  RA Volume:   44.10 ml  31.42 ml/m LA Vol (A4C):   40.1 ml 28.57 ml/m LA Biplane Vol: 58.7 ml 41.82 ml/m  AORTIC VALVE AV Area (Vmax):    1.96 cm AV Area (Vmean):   1.86 cm AV Area (VTI):     2.04 cm AV Vmax:           135.00 cm/s AV Vmean:          92.000 cm/s AV VTI:            0.303 m AV Peak Grad:      7.3 mmHg AV Mean Grad:      4.0 mmHg LVOT Vmax:         93.30 cm/s LVOT Vmean:        60.300 cm/s LVOT VTI:          0.218 m LVOT/AV VTI ratio: 0.72  AORTA Ao Root diam: 3.00 cm Ao Asc diam:  2.60 cm MITRAL VALVE MV Area (PHT): 2.55 cm    SHUNTS MV Decel Time: 298 msec    Systemic VTI:  0.22 m MV E velocity: 92.80 cm/s  Systemic Diam: 1.90 cm MV A velocity: 92.00 cm/s MV E/A ratio:  1.01 Riley LamMahesh Chandrasekhar MD Electronically signed by Riley LamMahesh Chandrasekhar MD  Signature Date/Time: 08/05/2021/11:08:42 AM    Final    MR BRAIN WO CONTRAST  Result Date: 08/05/2021 CLINICAL DATA:  Stroke follow-up. Residual memory dysfunction. Sudden onset of confusion EXAM: MRI HEAD WITHOUT CONTRAST TECHNIQUE: Multiplanar, multiecho pulse sequences of the brain and surrounding structures were obtained without intravenous contrast. COMPARISON:  Head CT from yesterday FINDINGS: Brain: No acute infarction, hemorrhage, hydrocephalus, extra-axial collection or mass lesion. Chronic small vessel ischemia in the hemispheric white matter that is moderately extensive. Remote perforator infarct affecting the right basal ganglia. Mild cerebral volume loss. Vascular: Major flow voids are preserved Skull and upper cervical spine: No focal marrow lesion Sinuses/Orbits: Essentially clear sinuses and negative orbits. IMPRESSION: 1. No acute or reversible finding. 2. Chronic small vessel ischemia including remote basal ganglia infarct on the right. Electronically Signed   By: Tiburcio PeaJonathan  Watts M.D.   On: 08/05/2021 04:56   CT HEAD WO CONTRAST (5MM)  Result Date: 08/04/2021 CLINICAL DATA:  24 hours post stroke code EXAM: CT HEAD WITHOUT CONTRAST TECHNIQUE: Contiguous axial images were obtained from the base of the skull through the vertex without intravenous contrast. RADIATION DOSE REDUCTION: This exam was performed according to the departmental dose-optimization program which includes automated exposure control, adjustment of the mA and/or kV according to patient size and/or use of iterative reconstruction technique. COMPARISON:  08/03/2021 FINDINGS: Brain: No significant interval change compared to 08/03/2021. No evidence of acute infarction, hemorrhage, mass, mass effect, or midline shift. No hydrocephalus or extra-axial fluid collection. Redemonstrated remote infarcts involving the right lentiform nucleus and caudate head with associated ex vacuo dilatation of the right frontal horn. Vascular: No  hyperdense vessel. Atherosclerotic calcifications in the intracranial carotid and vertebral arteries. Skull: Normal.  Negative for fracture or focal lesion. Sinuses/Orbits: No acute finding. Other: The mastoid air cells are well aerated. IMPRESSION: No acute intracranial process. No developing hypodensity to suggest interval infarct. Electronically Signed   By: Wiliam Ke M.D.   On: 08/04/2021 15:39   DG Chest Portable 1 View  Result Date: 08/03/2021 CLINICAL DATA:  Fever. EXAM: PORTABLE CHEST 1 VIEW COMPARISON:  Chest x-ray 02/09/2011 FINDINGS: The heart size and mediastinal contours are within normal limits. Both lungs are clear. No acute fractures are seen. There is a healed left clavicular fracture. IMPRESSION: No active disease. Electronically Signed   By: Darliss Cheney M.D.   On: 08/03/2021 15:41   CT ANGIO HEAD NECK W WO CM (CODE STROKE)  Result Date: 08/03/2021 CLINICAL DATA:  Acute onset of aphasia and right-sided weakness. EXAM: CT ANGIOGRAPHY HEAD AND NECK TECHNIQUE: Multidetector CT imaging of the head and neck was performed using the standard protocol during bolus administration of intravenous contrast. Multiplanar CT image reconstructions and MIPs were obtained to evaluate the vascular anatomy. Carotid stenosis measurements (when applicable) are obtained utilizing NASCET criteria, using the distal internal carotid diameter as the denominator. RADIATION DOSE REDUCTION: This exam was performed according to the departmental dose-optimization program which includes automated exposure control, adjustment of the mA and/or kV according to patient size and/or use of iterative reconstruction technique. CONTRAST:  26mL OMNIPAQUE IOHEXOL 350 MG/ML SOLN COMPARISON:  CT head without contrast 08/03/2021. CTA head and neck 11/19/2017 FINDINGS: CTA NECK FINDINGS Aortic arch: Minimal atherosclerotic changes are present in the distal arch. Great vessel origins scratched at minimal calcification present at the  left subclavian origin, stable. Great vessel origins are otherwise within normal limits. No significant stenosis or aneurysm is present. Right carotid system: Right common carotid artery demonstrates stable mild noncalcified mural changes distally. Mild atherosclerotic changes are again noted the right carotid bifurcation without significant stenosis. Cervical right ICA is otherwise normal. Left carotid system: The left common carotid artery is within normal limits. Atherosclerotic changes are present at the bifurcation without a significant stenosis. Mild tortuosity of the cervical left ICA is stable without significant stenosis. Vertebral arteries: The left vertebral artery is the dominant vessel. Both vertebral arteries originate from the subclavian arteries. High-grade stenosis is present at the origin of the hypoplastic right vertebral artery. No other stenoses are present in the neck. Skeleton: Vertebral body heights and alignment are normal. No focal osseous lesions are present. Other neck: Soft tissues the neck are otherwise unremarkable. Salivary glands are within normal limits. Thyroid is normal. No significant adenopathy is present. No focal mucosal or submucosal lesions are present. Upper chest: The lung apices are clear. Thoracic inlet is within normal limits. Review of the MIP images confirms the above findings CTA HEAD FINDINGS Anterior circulation: Atherosclerotic calcifications are again seen within the cavernous internal carotid arteries bilaterally without a significant stenosis relative to the distal vessel. ICA termini are within normal limits bilaterally. The A1 and M1 segments are normal. The anterior communicating artery is patent. The MCA bifurcations are within normal limits bilaterally. The ACA and MCA branch vessels are within normal limits bilaterally. Posterior circulation: Tandem high-grade stenoses are present in the right vertebral artery. Left PICA origin is visualized and normal.  Vertebrobasilar junction and basilar artery is normal. Both posterior cerebral arteries originate from the basilar tip. Venous sinuses: The dural sinuses are patent. The straight sinus deep cerebral veins are intact. Cortical veins are within normal limits. No significant vascular malformation is evident. Anatomic  variants: No significant anatomic variant Review of the MIP images confirms the above findings IMPRESSION: 1. 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 2. Tandem high-grade stenoses in the intracranial right vertebral artery. 3. No other significant stenosis in the neck. 4. No other significant proximal stenosis, aneurysm, or branch vessel occlusion within the Circle of Willis. 5. Aortic Atherosclerosis (ICD10-I70.0). Electronically Signed   By: San Morelle M.D.   On: 08/03/2021 15:29   CT HEAD CODE STROKE WO CONTRAST`  Result Date: 08/03/2021 CLINICAL DATA:  Code stroke. Aphasia. Right-sided weakness. Symptoms began less than 1 hour ago. EXAM: CT HEAD WITHOUT CONTRAST TECHNIQUE: Contiguous axial images were obtained from the base of the skull through the vertex without intravenous contrast. RADIATION DOSE REDUCTION: This exam was performed according to the departmental dose-optimization program which includes automated exposure control, adjustment of the mA and/or kV according to patient size and/or use of iterative reconstruction technique. COMPARISON:  07/31/2020 FINDINGS: Brain: Remote infarcts of the right lentiform nucleus and caudate head are noted. Ex vacuo dilation of the right lateral ventricle is associated. A remote lacunar infarct is present in the left lentiform nucleus. No acute infarct, hemorrhage, or mass lesion is present. Mild atrophy and white matter changes are otherwise stable. Vascular: Atherosclerotic calcifications are present within the cavernous internal carotid arteries. No hyperdense vessel is present. Skull: Calvarium is intact.  No focal lytic or blastic lesions are present. No significant extracranial soft tissue lesion is present. Sinuses/Orbits: The paranasal sinuses and mastoid air cells are clear. The globes and orbits are within normal limits. Other: ASPECTS ([place] Stroke Program Early CT Score) - Ganglionic level infarction (caudate, lentiform nuclei, internal capsule, insula, M1-M3 cortex): 7/7 - Supraganglionic infarction (M4-M6 cortex): 3/3 Total score (0-10 with 10 being normal): 10/10 IMPRESSION: 1. Stable remote bilateral basal ganglia lacunar infarcts. 2. No acute intracranial abnormality 3. ASPECTS is 10/10 These results were called by telephone at the time of interpretation on 08/03/2021 at 2:39 pm to provider [facility] , who verbally acknowledged these results. Electronically Signed   By: San Morelle M.D.   On: 08/03/2021 14:42     Assessment and Plan:   Paroxysmal Atrial Fibrillation  - EKG from yesterday showed atrial fibrillation, HR 88 BPM. Not on telemetry, HR normal on my physical examination  - Echo this admission with EF 60-65%, no regional wall motion abnormalities, mild LVH - CHADS-VASc 5 (HTN, stroke, age, gender)  - Discussed with neurology-- start eliquis 5 mg BID per their recommendations. Stop ASA, plavix  - HR is in the 60s-80s when in NSR, did get up to the 120s when in afib. Start metoprolol 25 mg BID for now, can titrate further as BP tolerates at outpatient follow up - Patient has a follow up with cardiology on 8/17. Can consider wearing monitor at that time to assess Afib burden . Should also check TSH at that time (patient is being discharged this evening, doubt lab would be back before she leaves)    Risk Assessment/Risk Scores:        CHA2DS2-VASc Score = 5  This indicates a 7.2% annual risk of stroke. The patient's score is based upon: CHF History: 0 HTN History: 1 Diabetes History: 0 Stroke History: 2 Vascular Disease History: 0 Age Score: 1 Gender  Score: 1       For questions or updates, please contact [place] Please consult [email or web address] for contact info under    Signed, Margie Billet, PA-C  08/05/2021 5:06 PM
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
## 2021-08-05 NOTE — Plan of Care (Signed)
  Problem: Education: Goal: Knowledge of General Education information will improve Description: Including pain rating scale, medication(s)/side effects and non-pharmacologic comfort measures Outcome: Progressing   Problem: Health Behavior/Discharge Planning: Goal: Ability to manage health-related needs will improve Outcome: Not Progressing   Problem: Clinical Measurements: Goal: Ability to maintain clinical measurements within normal limits will improve Outcome: Progressing Goal: Will remain free from infection Outcome: Progressing Goal: Diagnostic test results will improve Outcome: Progressing Goal: Respiratory complications will improve Outcome: Progressing Goal: Cardiovascular complication will be avoided Outcome: Progressing   Problem: Activity: Goal: Risk for activity intolerance will decrease Outcome: Progressing   Problem: Nutrition: Goal: Adequate nutrition will be maintained Outcome: Progressing   Problem: Coping: Goal: Level of anxiety will decrease Outcome: Progressing   Problem: Pain Managment: Goal: General experience of comfort will improve Outcome: Progressing   Problem: Safety: Goal: Ability to remain free from injury will improve Outcome: Progressing   Problem: Skin Integrity: Goal: Risk for impaired skin integrity will decrease Outcome: Progressing
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
## 2021-08-05 NOTE — Discharge Instructions (Addendum)
[name], you came to the hospital with new onset confusion, difficulty speaking and right sided weakness.  Your symptoms were thought to be due to a stroke, and you were given TNK to treat this.  Your symptoms resolved, and MRI shows no permanent damage to your brain.  While in the hospital, you were found to have an irregular heart rhythm called atrial fibrillation which puts you at risk for developing blood clots that can cause a stroke.  You will need to take Eliquis to prevent this from happening.  Please be aware that Eliquis is a strong blood thinner and you should be careful to avoid slips or falls.  Seek medical attention if you fall and hit your head.

## 2021-08-05 NOTE — Progress Notes (Signed)
  Echocardiogram 2D Echocardiogram has been performed.  Sherry Fitzgerald 08/05/2021, 10:52 AM

## 2021-08-05 NOTE — Plan of Care (Signed)
Goals met 

## 2021-08-08 LAB — CULTURE, BLOOD (ROUTINE X 2)
Culture: NO GROWTH
Culture: NO GROWTH
Special Requests: ADEQUATE
Special Requests: ADEQUATE

## 2021-08-11 DIAGNOSIS — I6930 Unspecified sequelae of cerebral infarction: Secondary | ICD-10-CM | POA: Diagnosis not present

## 2021-08-11 DIAGNOSIS — I480 Paroxysmal atrial fibrillation: Secondary | ICD-10-CM | POA: Diagnosis not present

## 2021-08-11 DIAGNOSIS — G459 Transient cerebral ischemic attack, unspecified: Secondary | ICD-10-CM | POA: Diagnosis not present

## 2021-08-11 DIAGNOSIS — I10 Essential (primary) hypertension: Secondary | ICD-10-CM | POA: Diagnosis not present

## 2021-08-11 DIAGNOSIS — Z7901 Long term (current) use of anticoagulants: Secondary | ICD-10-CM | POA: Diagnosis not present

## 2021-08-11 DIAGNOSIS — E785 Hyperlipidemia, unspecified: Secondary | ICD-10-CM | POA: Diagnosis not present

## 2021-08-11 DIAGNOSIS — F01B0 Vascular dementia, moderate, without behavioral disturbance, psychotic disturbance, mood disturbance, and anxiety: Secondary | ICD-10-CM | POA: Diagnosis not present

## 2021-08-20 NOTE — Progress Notes (Unsigned)
Cardiology Clinic Note   Patient Name: Sherry Fitzgerald Date of Encounter: 08/21/2021  Primary Care Provider:  Garlan Fillers, MD Primary Cardiologist:  Chrystie Nose, MD  Patient Profile    Sherry Fitzgerald 76 year old female presents to the clinic today for follow-up evaluation of her essential hypertension and atrial fibrillation.  Past Medical History    Past Medical History:  Diagnosis Date   Cerebrovascular accident (CVA) (HCC)    Hypertension    no medications for years   Past Surgical History:  Procedure Laterality Date   CHOLECYSTECTOMY  2014    Allergies  No Known Allergies  History of Present Illness    Sherry Fitzgerald has a PMH of HTN, HLD, TIA, atrial fibrillation, aphasia, right hemiparesis, hypoglycemia, and chest wall pain.  She is a special forces veteran.  She was admitted to the hospital on 08/03/2021 and discharged on 08/05/2021.  She was diagnosed with CVA.  She received TNK for her stroke symptoms and noted improvement.  Her MRI showed no acute abnormalities.  It was felt the patient had TIA or a small stroke which was aborted by TNK.  She was noted to have a fever on admission.  Her UA and chest x-ray were negative.  Her white count remained normal.  Antibiotics were administered and her fever resolved.  She was found to have new onset atrial fibrillation.  Cardiology was consulted.  She was started on apixaban.  Her aspirin was discontinued and she was started on losartan for elevated blood pressure.  She presents to the clinic today for follow-up evaluation states she feels well.  She has had no further episodes of increased heart rate.  Her husband purchased a mobile cardiac monitor and we reviewed readings.  They showed sinus rhythm and sinus bradycardia.  Her EKG today shows sinus bradycardia septal infarct undetermined age 13 bpm.  She has been walking some.  Her blood pressures at home have been fairly well controlled in the 120s-130 systolic.   Initially her blood pressure today is 142/78 and on recheck it is 132/70.  I will continue her current medication regimen, have her increase physical activity as tolerated, give salty 6 diet sheet, and plan follow-up for 6 months.  Today she denies chest pain, shortness of breath, lower extremity edema, fatigue, palpitations, melena, hematuria, hemoptysis, diaphoresis, weakness, presyncope, syncope, orthopnea, and PND.   Home Medications    Prior to Admission medications   Medication Sig Start Date End Date Taking? Authorizing Provider  apixaban (ELIQUIS) 5 MG TABS tablet Take 1 tablet (5 mg total) by mouth 2 (two) times daily. 08/05/21   de Saintclair Halsted, Cortney E, NP  atorvastatin (LIPITOR) 80 MG tablet Take 1 tablet (80 mg total) by mouth daily. 11/08/17   Myrene Buddy, MD  ezetimibe (ZETIA) 10 MG tablet Take 10 mg by mouth daily. 06/09/21   [provider]  losartan (COZAAR) 100 MG tablet Take 1 tablet (100 mg total) by mouth daily. 08/06/21   de Saintclair Halsted, Cortney E, NP  memantine (NAMENDA) 10 MG tablet Take 1 tablet (10 mg total) by mouth 2 (two) times daily. 12/09/20   Ihor Austin, NP  metoprolol tartrate (LOPRESSOR) 25 MG tablet Take 1 tablet (25 mg total) by mouth 2 (two) times daily. 08/05/21   de Verdis Prime, NP    Family History    Family History  Problem Relation Age of Onset   CVA Mother  Heart disease Father 31   CVA Father    She indicated that her mother is deceased. She indicated that her father is deceased.  Social History    Social History   Socioeconomic History   Marital status: Married    Spouse name: Not on file   Number of children: Not on file   Years of education: Not on file   Highest education level: Not on file  Occupational History   Occupation: retired  Tobacco Use   Smoking status: Never   Smokeless tobacco: Never  Substance and Sexual Activity   Alcohol use: Never   Drug use: Never   Sexual activity: Not on file  Other Topics  Concern   Not on file  Social History Narrative   Former Advertising copywriter.  Lives alone, but has 5 children in the area who are grown and supportive.  +Financial difficulties - trouble paying bills.  She has been married to her current husband for 3 years.   Social Determinants of Health   Financial Resource Strain: Not on file  Food Insecurity: Not on file  Transportation Needs: Not on file  Physical Activity: Not on file  Stress: Not on file  Social Connections: Not on file  Intimate Partner Violence: Not on file     Review of Systems    General:  No chills, fever, night sweats or weight changes.  Cardiovascular:  No chest pain, dyspnea on exertion, edema, orthopnea, palpitations, paroxysmal nocturnal dyspnea. Dermatological: No rash, lesions/masses Respiratory: No cough, dyspnea Urologic: No hematuria, dysuria Abdominal:   No nausea, vomiting, diarrhea, bright red blood per rectum, melena, or hematemesis Neurologic:  No visual changes, wkns, changes in mental status. All other systems reviewed and are otherwise negative except as noted above.  Physical Exam    VS:  BP 132/70   Pulse (!) 43   Ht 4\' 8"  (1.422 m)   Wt 116 lb (52.6 kg)   BMI 26.01 kg/m  , BMI Body mass index is 26.01 kg/m. GEN: Well nourished, well developed, in no acute distress. HEENT: normal. Neck: Supple, no JVD, carotid bruits, or masses. Cardiac: RRR, no murmurs, rubs, or gallops. No clubbing, cyanosis, edema.  Radials/DP/PT 2+ and equal bilaterally.  Respiratory:  Respirations regular and unlabored, clear to auscultation bilaterally. GI: Soft, nontender, nondistended, BS + x 4. MS: no deformity or atrophy. Skin: warm and dry, no rash. Neuro:  Strength and sensation are intact. Psych: Normal affect.  Accessory Clinical Findings    Recent Labs: 08/03/2021: ALT 17 08/05/2021: BUN 10; Creatinine, Ser 0.80; Hemoglobin 10.8; Platelets 170; Potassium 5.0; Sodium 142   Recent Lipid Panel     Component Value Date/Time   CHOL 97 08/04/2021 0204   CHOL 150 06/07/2019 0850   TRIG 44 08/04/2021 0204   HDL 50 08/04/2021 0204   HDL 66 06/07/2019 0850   CHOLHDL 1.9 08/04/2021 0204   VLDL 9 08/04/2021 0204   LDLCALC 38 08/04/2021 0204   LDLCALC 69 06/07/2019 0850   LDLDIRECT 191 (H) 07/30/2014 1659    ECG personally reviewed by me today-sinus bradycardia septal infarct undetermined age 96 bpm- No acute changes  Echo 08/05/21  IMPRESSIONS     1. Left ventricular ejection fraction, by estimation, is 60 to 65%. The  left ventricle has normal function. The left ventricle has no regional  wall motion abnormalities. There is mild concentric left ventricular  hypertrophy of the basal-septal segment.  Left ventricular diastolic parameters are indeterminate. Elevated left  atrial pressure.   2. Right ventricular systolic function is normal. The right ventricular  size is normal. Tricuspid regurgitation signal is inadequate for assessing  PA pressure.   3. Left atrial size was moderately dilated.   4. The mitral valve is normal in structure. Mild mitral valve  regurgitation. No evidence of mitral stenosis.   5. The aortic valve is tricuspid. Aortic valve regurgitation is not  visualized. Aortic valve sclerosis is present, with no evidence of aortic  valve stenosis.   6. The inferior vena cava is normal in size with greater than 50%  respiratory variability, suggesting right atrial pressure of 3 mmHg.   Comparison(s): No significant change from prior study. Assessment & Plan   1.  Atrial fibrillation-heart rate today 45 bpm.  Noted in the setting of TIA.Reports compliance with apixaban and denies bleeding issues. Continue apixaban, metoprolol.   Heart healthy low-sodium diet-salty 6 given Increase physical activity as tolerated  Essential hypertension-BP today 132/70.  Well-controlled at home. Continue losartan, metoprolol Heart healthy low-sodium diet-salty 6 given Increase  physical activity as tolerated Maintain BP log  Hyperlipidemia-08/04/2021: Cholesterol 97; HDL 50; LDL Cholesterol 38; Triglycerides 44; VLDL 9 Continue atorvastatin, ezetimibe Heart healthy low-sodium high-fiber diet Increase physical activity as tolerated  TIA-no deficits.  Presented to the hospital on 08/03/2021 and received tPA in.  Had left hemispheric TIA versus aborted CVA which was secondary to TNK therapy. Continue apixaban Follows with neurology  Disposition: Follow up with Dr Rennis Golden in 6 months   Thomasene Ripple. Nakhia Levitan NP-C     08/21/2021, 10:19 AM [facility] [address] Office [phone] Fax [phone]  Notice: This dictation was prepared with Dragon dictation along with smaller phrase technology. Any transcriptional errors that result from this process are unintentional and may not be corrected upon review.  I spent 14 minutes examining this patient, reviewing medications, and using patient centered shared decision making involving her cardiac care.  Prior to her visit I spent greater than 20 minutes reviewing her past medical history,  medications, and prior cardiac tests.

## 2021-08-21 ENCOUNTER — Encounter: Payer: Self-pay | Admitting: General Practice

## 2021-08-21 ENCOUNTER — Ambulatory Visit (INDEPENDENT_AMBULATORY_CARE_PROVIDER_SITE_OTHER): Payer: Medicare Other | Admitting: General Practice

## 2021-08-21 VITALS — BP 132/70 | HR 43 | Ht <= 58 in | Wt 116.0 lb

## 2021-08-21 DIAGNOSIS — E782 Mixed hyperlipidemia: Secondary | ICD-10-CM

## 2021-08-21 DIAGNOSIS — I10 Essential (primary) hypertension: Secondary | ICD-10-CM | POA: Diagnosis not present

## 2021-08-21 DIAGNOSIS — I4891 Unspecified atrial fibrillation: Secondary | ICD-10-CM | POA: Diagnosis not present

## 2021-08-21 DIAGNOSIS — G459 Transient cerebral ischemic attack, unspecified: Secondary | ICD-10-CM

## 2021-08-21 NOTE — Patient Instructions (Signed)
Medication Instructions:  The current medical regimen is effective;  continue present plan and medications as directed. Please refer to the Current Medication list given to you today.   *If you need a refill on your cardiac medications before your next appointment, please call your pharmacy*  Lab Work:   Testing/Procedures:  NONE    NONE If you have labs (blood work) drawn today and your tests are completely normal, you will receive your results only by:  1-MyChart Message (if you have MyChart) OR  2-A paper copy in the mail.  If you have any lab test that is abnormal or we need to change your treatment, we will call you to review the results.  Special Instructions CONTINUE KARTIA AS NEEDED OR WEEKLY  CONTINUE TO TAKE AND LOG BLOOD PRESSURE WEEKLY/WHEN YOU FEEL BAD  CONTINUE PHYSICAL ACTIVITY-AS TOLERATED  Follow-Up: Your next appointment:  6 month(s) In Person with Chrystie Nose, MD     Please call our office 2 months in advance to schedule this appointment   At [facility], you and your health needs are our priority.  As part of our continuing mission to provide you with exceptional heart care, we have created designated Provider Care Teams.  These Care Teams include your primary Cardiologist (physician) and Advanced Practice Providers (APPs -  Physician Assistants and Nurse Practitioners) who all work together to provide you with the care you need, when you need it.  Important Information About Sugar

## 2021-08-25 NOTE — Addendum Note (Signed)
Addended by: Alyson Ingles on: 08/25/2021 10:18 AM   Modules accepted: Orders

## 2021-08-28 ENCOUNTER — Other Ambulatory Visit: Payer: Self-pay | Admitting: Adult Health

## 2021-10-14 ENCOUNTER — Other Ambulatory Visit: Payer: Self-pay | Admitting: Adult Health

## 2021-11-11 ENCOUNTER — Other Ambulatory Visit: Payer: Self-pay

## 2021-11-11 NOTE — Patient Outreach (Signed)
   Telephone outreach to patient's huband to obtain mRS was successfully completed. MRS= 3  Mentioned that stroke affected her more mentally, sometime wonder and forgets where she going so husband is will her all the time. Taking memotine a brain medicine and missing one dose makes a major.   [place] Management Assistant [phone]

## 2021-12-09 NOTE — Progress Notes (Unsigned)
[facility] [address] [place] [place] [address] [phone]       OFFICE FOLLOW UP VISIT NOTE  [name] Date of Birth:  1945/11/27 Medical Record Number:  [id number]   Referring MD:  Marvel Plan Reason for Referral: Stroke follow-up   Chief complaint: No chief complaint on file.      HPI:  Update 12/10/2021 JM: Patient returns for yearly stroke follow-up.  She unfortunately had recurrent stroke in left MCA not visualized on MRI vs TIA s/p TNK on 08/03/2021 likely due to new onset A-fib after presenting with acute onset confusion, dysarthria and right-sided weakness which improved after TNK.  She was placed on Eliquis 5 mg twice daily.    Reports cognition ***.  Remains on Namenda, denies side effects.  Reports compliance on Eliquis and atorvastatin, denies side effects Blood pressure well controlled She has since had follow-up with cardiology routinely follows with PCP     History provided for reference purposes only Update 12/09/2020 JM: Returns for 1 year follow-up for history of stroke with residual cognitive impairment. She is accompanied by her husband  Overall stable since prior visit -denies new stroke/TIA symptoms Per husband, cognition continues to slowly decline more so short-term memory MMSE today 14/30 (prior 12/30) Remains on Namenda 10 mg twice daily -denies side effects.  Husband does notice if she misses 1 dose as she will be more agitated and worsening cognition Denies hallucinations, paranoia, delusions or agitation/aggression.  Sleeps well.  Good appetite. Able to maintain majority of ADLs independently but husband continues to provide 24/7 care and supervision He continues to try to keep her mentally stimulated -will have difficulty at times persuading her to do memory exercises such as games on her iPad as she will become easily frustrated due to difficulty operating iPad. She does not do any routine exercise but husband  tries to keep her physically active  Compliant on aspirin and atorvastatin -denies side effects Blood pressure today 167/96 - known elevation at OV -monitors at home and typically ranging 150s/60s  Completed labs 12/2 at PCP office (unable to personally view via epic) - has PCP f/u 12/9  No new concerns at this time  Update 12/07/2019 JM: [name] returns for 72-month stroke follow-up accompanied by her husband.  Cognitive impairment post stroke continues to slowly decline.  MMSE today 12/30 (prior 14/30).  Remains on Namenda with continued benefit subjectively and denies side effects.  He does report occasional perseveration but denies any other behavioral concerns.  Husband provides 24/7 care and supervision with patient continuing to be active as able.  Denies new stroke/TIA symptoms.  On aspirin 325 mg daily and atorvastatin 80 mg daily without side effects.  Recent lipid panel showed LDL 69.  Blood pressure today 164/69 (typically elevated at appointments). Stable at home per husband.  No further concerns at this time.  Update 06/06/2019 JM: [name] returns for stroke follow-up accompanied by her husband.  Residual deficits of cognitive impairment with husband reporting slow decline since prior visit greater with short-term memory, concentration and taking longer to perform normal activities.  He does endorse occasional wandering but no other behavioral concerns.  No evidence of depression or anxiety.  She does require assistance for IADLs and minimal assistance for ADLs.  MMSE today 14/30 (prior 13/30).  Continues on Namenda 10 mg twice daily with ongoing benefit and denies side effects.  Husband does notice a difference if she misses Namenda dose.  Continues on aspirin 325 mg daily and atorvastatin for secondary stroke prevention.  Lipid panel obtained 1 year prior satisfactory.  No recent lab work.  Blood pressure today elevated at 163/63, asymptomatic.  Blood pressure not routinely monitored  at home.  No concerns at this time.  Update 12/06/2018 JM: [name] is a 76 year old female who is being seen today for stroke follow-up accompanied by her husband.  Residual deficits include cognitive impairment.  Was initiated on Namenda at prior visit.  She has continued on Namenda 10 mg daily tolerating well without side effects.  Memory has been overall stable.  Per husband, she did miss a couple doses of Namenda and he did notice a great difference in her cognition.  He tries to encourage her to do memory exercises but she does not do them routinely.  He continues to provide 24/7 care and supervision due to cognitive impairment and safety concerns.  Continues on aspirin 325 mg daily and atorvastatin 80 mg daily for secondary stroke prevention without side effects.  Blood pressure today elevated initially and on recheck 150/80.  Monitor his levels at home which has been stable.  She was evaluated by Dr. Brett Fairy on 03/08/2018 for possible sleep apnea and recommended undergoing sleep testing but has not done so at this time as office has attempted to call twice but unable to contact.  No further concerns at this time.  Update 01/18/2018 Dr. Leonie Man : She returns for follow-up after last visit 2 months ago.  She is accompanied by her husband.  She continues to have cognitive impairment and memory difficulties which are not improving.  She has very poor short-term memory.  She cannot be left alone.  She needs constant reminders and supervision.  She is unable to do things that she used to do in the past and husband needs to keep a close eye on her.  She did undergo lab work for reversible causes of cognitive impairment on 11/10/2017 and vitamin B12, TSH and RPR were all normal.  EEG done on 12/07/2017 was also normal.  CT angiogram of the brain and neck done on 11/19/2017 showed no significant for current intra-and extracranial stenosis and the previously seen right carotid thrombus had completely resolved.  Patient  has poor appetite and has weight loss and hence would not be a good candidate for Aricept.  She has not tried [place] yet.  She is tolerating aspirin well without any bleeding or bruising.  She has stopped Coumadin after his CT Angie results showed resolution of the clot.  Her blood pressure is well controlled and today it is 121/6 3.  The patient was having a dry cough and her primary physician Dr. Sharlett Iles plans to change lisinopril to alternative blood pressure medicine.  She remains on Lipitor which is tolerating well without any side effects.  She has not had any recurrent stroke or TIA symptoms.  Initial visit 11/10/2017 Dr. Leonie Man; Ms Yakia Laclaire is a pleasant 77 year old [place] lady who is seen today for initial office consultation visit for stroke.  She is accompanied by her husband.  History is obtained from them and review of electronic medical records.  I have personally reviewed imaging films. HPI ( Dr Rory Percy ) Marcelina Morel Zelina Paoletti is an 76 y.o. female with a PMH of HTN who presented to the ED with a 2 day history of speech changes, AMS and left face weakness.Per patient family member these symptoms have been intermittent for weeks, but for the  past 2 days the patient has had increasing confusion. She is doing odd things like attempting to make coffee w/o coffee grounds. She is becoming more easily agitated and argues about the time. 2 days ago he noticed that the left side of her face seemed different and that she was not speaking as clearly. Denies any ETOH, or drug abuse. Denies any CP or SOB. Due to the persistence of symptoms and then becoming more frequent as well as the facial droop, there has been decided to bring the patient to the emergency room for evaluation.Noncontrast CT of the head was done that showed a hypodensity suggestive of subacute infarct in the right basal ganglia/caudate.Neurological consultation was obtained for further evaluation. .LKW: 2-4 day ago; uncertain of time  NIHSS: 3 MRI scan of the brain showed right frontal deep white matter infarct extending into the head of the caudate with scattered peripheral cortical right frontal infarcts.  MRI of the brain showed a 5 mm distal right MCA aneurysm.  2D echo showed normal ejection fraction without cardiac source of embolus.  LDL cholesterol was elevated at 180 mg percent.  Hemoglobin A1c was 6.1.  CT angiogram of the head and neck showed right common carotid artery thrombus which was nonocclusive.  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  Patient was felt to have embolized distally from the right common carotid artery thrombus and hence was started on anticoagulation with warfarin and on Lipitor for his elevated lipids.  Patient is currently living at home with her husband.  She has significant cognitive impairment with short-term memory being very poor.  She is also quite slow to process information and has trouble with multitasking.  She requires supervision for activities like cooking and working in the kitchen which she used to love.  She has left the burners on several times while cooking now.  She is tolerating warfarin well without bleeding or bruising and INR has been fairly stable.  Her facial droop and speech appear to have improved.  Patient's husband is wondering when her cognitive difficulties are going to improve and whether she will be able to become fully independent.  She has no prior history of cognitive impairment or memory difficulties.  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     ROS:   14 system review of systems is positive for memory loss, confusion and all other systems negative   PMH:  Past Medical History:  Diagnosis Date   Cerebrovascular accident (CVA) ([place])    Hypertension    no medications for years    Social History:  Social History   Socioeconomic History   Marital status: Married    Spouse name: Not on file   Number of  children: Not on file   Years of education: Not on file   Highest education level: Not on file  Occupational History   Occupation: retired  Tobacco Use   Smoking status: Never   Smokeless tobacco: Never  Substance and Sexual Activity   Alcohol use: Never   Drug use: Never   Sexual activity: Not on file  Other Topics Concern   Not on file  Social History Narrative   Former Advertising copywriter.  Lives alone, but has 5 children in the area who are grown and supportive.  +Financial difficulties - trouble paying bills.  She has been married to her current husband for 3 years.   Social Determinants of Health   Financial  Resource Strain: Not on file  Food Insecurity: Not on file  Transportation Needs: Not on file  Physical Activity: Not on file  Stress: Not on file  Social Connections: Not on file  Intimate Partner Violence: Not on file    Medications:   Current Outpatient Medications on File Prior to Visit  Medication Sig Dispense Refill   apixaban (ELIQUIS) 5 MG TABS tablet Take 1 tablet (5 mg total) by mouth 2 (two) times daily. 60 tablet 1   atorvastatin (LIPITOR) 80 MG tablet Take 1 tablet (80 mg total) by mouth daily. 90 tablet 0   ezetimibe (ZETIA) 10 MG tablet Take 10 mg by mouth daily.     losartan (COZAAR) 100 MG tablet Take 1 tablet (100 mg total) by mouth daily. 30 tablet 1   memantine (NAMENDA) 10 MG tablet TAKE 1 TABLET BY MOUTH TWICE A DAY 180 tablet 4   metoprolol tartrate (LOPRESSOR) 25 MG tablet Take 1 tablet (25 mg total) by mouth 2 (two) times daily. 30 tablet 0   No current facility-administered medications on file prior to visit.    Allergies:  No Known Allergies  Physical Exam  There were no vitals filed for this visit.   There is no height or weight on file to calculate BMI.  General: petite frail [place] lady, seated, in no evident distress Head: head normocephalic and atraumatic.   Neck: supple with no carotid or supraclavicular  bruits Cardiovascular: regular rate and rhythm, no murmurs Musculoskeletal: no deformity Skin:  no rash/petichiae Vascular:  Normal pulses all extremities  Neurologic Exam Mental Status: Awake and fully alert. No evidence of aphasia or dysarthria.  Follows commands without great difficulty.  Mood and affect appropriate.    12/09/2020    8:32 AM 12/07/2019    8:44 AM 06/07/2019    8:11 AM  MMSE - Mini Mental State Exam  Orientation to time 1 0 0  Orientation to Place 4 4 2   Registration 3 0 3  Attention/ Calculation 0 0 1  Recall 0 3 0  Language- name 2 objects 1 2 2   Language- repeat 0 0 1  Language- follow 3 step command 3 2 3   Language- read & follow direction 1 1 1   Write a sentence 1 0 0  Copy design 0 0 1  Total score 14 12 14    Cranial Nerves: Pupils equal, briskly reactive to light. Extraocular movements full without nystagmus. Visual fields full to confrontation. Hearing intact. Facial sensation intact. Face, tongue, palate moves normally and symmetrically.  Motor: Normal bulk and tone. Normal strength in all tested extremity muscles.   Sensory.: intact to touch , pinprick , position and vibratory sensation.  Coordination: Rapid alternating movements normal in all extremities. Finger-to-nose and heel-to-shin performed accurately bilaterally. Gait and Station: Arises from chair without difficulty. Stance is normal. Gait demonstrates normal stride length and balance without use of assistive device Reflexes: 1+ and symmetric. Toes downgoing.       ASSESSMENT/PLAN: 76 year old [place] lady with small stroke not seen on imaging versus TIA s/p TNK on 08/03/2021 likely in setting of new onset A-fib and history of right MCA branch infarct in September 2019 secondary to thromboembolism from right carotid thrombus with significant residual vascular cognitive impairment.  Vascular risk factors of hypertension, carotid thrombus and hyperlipidemia.      L MCA stroke Hx of R MCA  stroke :  Residual deficit: Cognitive impairment (see #2).  Continue  Eliquis 5 mg BID   and  atorvastatin 80 mg daily for secondary stroke prevention.  Managed by PCP Continue to follow with cardiology for atrial fibrillation and Eliquis management Discussed secondary stroke prevention measures and importance of close PCP f/u for aggressive stroke risk factor management including HTN with BP goal<130/90 and HLD with LDL goal<70 Stroke labs 08/2021: A1c 6.1, LDL 38  Vascular dementia w/o behaviors:  Subjectively, cognition continues to decline more so short-term memory MMSE today 14/30 (prior 12/30) continue Namenda 10 mg twice daily -refill provided. husband not interested in any other type of medication such as Aricept especially as unknown benefit in vascular dementia Highly encourage memory exercises such as puzzles, reading and card games as well as routine exercise, socialization, healthy diet and adequate sleep as well as importance of managing stroke risk factors    Follow-up in 1 year or call earlier if needed   CC:  Donnajean Lopes, MD    I spent 34 minutes of face-to-face and non-face-to-face time with patient and husband.  This included previsit chart review, lab review, study review, order entry, electronic health record documentation, patient and husband education and discussion regarding history of stroke, residual cognitive impairment likely vascular dementia and ongoing medication management, completion and review of MMSE, importance of managing stroke risk factors and answered all other questions to patient and husband satisfaction  Frann Rider, AGNP-BC  [facility] [address] [place], [place] [address]  Phone [phone] Fax [phone] Note: This document was prepared with digital dictation and possible smart phrase technology. Any transcriptional errors that result from this process are unintentional.

## 2021-12-10 ENCOUNTER — Encounter: Payer: Self-pay | Admitting: Adult Health

## 2021-12-10 ENCOUNTER — Ambulatory Visit (INDEPENDENT_AMBULATORY_CARE_PROVIDER_SITE_OTHER): Payer: Medicare Other | Admitting: Adult Health

## 2021-12-10 VITALS — BP 170/65 | HR 45 | Ht <= 58 in | Wt 117.0 lb

## 2021-12-10 DIAGNOSIS — F0150 Vascular dementia without behavioral disturbance: Secondary | ICD-10-CM

## 2021-12-10 DIAGNOSIS — Z8673 Personal history of transient ischemic attack (TIA), and cerebral infarction without residual deficits: Secondary | ICD-10-CM | POA: Diagnosis not present

## 2021-12-10 DIAGNOSIS — I63412 Cerebral infarction due to embolism of left middle cerebral artery: Secondary | ICD-10-CM

## 2021-12-10 NOTE — Patient Instructions (Addendum)
Continue Namenda 10mg  twice daily   Continue  Eliquis 5mg  twice daily   and atorvastatin  for secondary stroke prevention  Continue to follow up with PCP regarding cholesterol and blood pressure management  Maintain strict control of hypertension with blood pressure goal below 130/90 and cholesterol with LDL cholesterol (bad cholesterol) goal below 70 mg/dL.   Signs of a Stroke? Follow the BEFAST method:  Balance Watch for a sudden loss of balance, trouble with coordination or vertigo Eyes Is there a sudden loss of vision in one or both eyes? Or double vision?  Face: Ask the person to smile. Does one side of the face droop or is it numb?  Arms: Ask the person to raise both arms. Does one arm drift downward? Is there weakness or numbness of a leg? Speech: Ask the person to repeat a simple phrase. Does the speech sound slurred/strange? Is the person confused ? Time: If you observe any of these signs, call 911.     Followup in the future with me in 6 months with Dr. or call earlier if needed       Thank you for coming to see at [facility]. I hope we have been able to provide you high quality care today.  You may receive a patient satisfaction survey over the next few weeks. We would appreciate your feedback and comments so that we may continue to improve ourselves and the health of our patients.

## 2021-12-11 DIAGNOSIS — I10 Essential (primary) hypertension: Secondary | ICD-10-CM | POA: Diagnosis not present

## 2021-12-11 DIAGNOSIS — R7301 Impaired fasting glucose: Secondary | ICD-10-CM | POA: Diagnosis not present

## 2021-12-11 DIAGNOSIS — E785 Hyperlipidemia, unspecified: Secondary | ICD-10-CM | POA: Diagnosis not present

## 2021-12-11 DIAGNOSIS — R5382 Chronic fatigue, unspecified: Secondary | ICD-10-CM | POA: Diagnosis not present

## 2021-12-11 DIAGNOSIS — R7989 Other specified abnormal findings of blood chemistry: Secondary | ICD-10-CM | POA: Diagnosis not present

## 2021-12-18 DIAGNOSIS — I10 Essential (primary) hypertension: Secondary | ICD-10-CM | POA: Diagnosis not present

## 2021-12-18 DIAGNOSIS — Z7901 Long term (current) use of anticoagulants: Secondary | ICD-10-CM | POA: Diagnosis not present

## 2021-12-18 DIAGNOSIS — Z1331 Encounter for screening for depression: Secondary | ICD-10-CM | POA: Diagnosis not present

## 2021-12-18 DIAGNOSIS — I480 Paroxysmal atrial fibrillation: Secondary | ICD-10-CM | POA: Diagnosis not present

## 2021-12-18 DIAGNOSIS — E785 Hyperlipidemia, unspecified: Secondary | ICD-10-CM | POA: Diagnosis not present

## 2021-12-18 DIAGNOSIS — Z1339 Encounter for screening examination for other mental health and behavioral disorders: Secondary | ICD-10-CM | POA: Diagnosis not present

## 2021-12-18 DIAGNOSIS — G4700 Insomnia, unspecified: Secondary | ICD-10-CM | POA: Diagnosis not present

## 2021-12-18 DIAGNOSIS — F01B0 Vascular dementia, moderate, without behavioral disturbance, psychotic disturbance, mood disturbance, and anxiety: Secondary | ICD-10-CM | POA: Diagnosis not present

## 2021-12-18 DIAGNOSIS — Z0000 Encounter for general adult medical examination without abnormal findings: Secondary | ICD-10-CM | POA: Diagnosis not present

## 2022-01-14 DIAGNOSIS — Z23 Encounter for immunization: Secondary | ICD-10-CM | POA: Diagnosis not present

## 2022-07-01 ENCOUNTER — Encounter: Payer: Self-pay | Admitting: Neurology

## 2022-07-01 ENCOUNTER — Ambulatory Visit: Payer: Medicare HMO | Admitting: Neurology

## 2022-07-01 VITALS — BP 136/64 | HR 64 | Ht 59.0 in | Wt 119.0 lb

## 2022-07-01 DIAGNOSIS — I63412 Cerebral infarction due to embolism of left middle cerebral artery: Secondary | ICD-10-CM | POA: Diagnosis not present

## 2022-07-01 DIAGNOSIS — F0280 Dementia in other diseases classified elsewhere without behavioral disturbance: Secondary | ICD-10-CM | POA: Diagnosis not present

## 2022-07-01 MED ORDER — MEMANTINE HCL ER 28 MG PO CP24: 28.0000 mg | ORAL_CAPSULE | Freq: Every day | ORAL | 3 refills | Status: DC

## 2022-07-01 NOTE — Progress Notes (Signed)
[facility] [address] [place]. [place] [address] [phone]       OFFICE FOLLOW UP VISIT NOTE  [name] Date of Birth:  12/23/1945 Medical Record Number:  [id number]   Referring MD:  Marvel Plan Reason for Referral: Stroke follow-up   Chief complaint: Chief Complaint  Patient presents with   Follow-up    Rm 3, here with husband Arles  Here for follow  up on stroke. Has been stable.  Pt's husband states pt has noticing cognitive decline. States memory has been getting worse.        HPI: Update 07/01/2022 : She returns for follow-up after last visit 6 months ago.  She is accompanied by her husband.  She continues to have memory and cognitive difficulties.  Subsequently memory getting worse.  He is now getting to not being able to answer the phone currently.  She does have some sundowning and gets confused during the evening time.  There are no agitation or violent behavior.  She does not wander off.  She does not have any delusions or hallucinations.  She can recognize her children but has trouble recognizing her grandchildren and other family members.  Memantine 10 mg twice daily which is tolerating well without side effects.  On Eliquis tolerating well without bruising or bleeding.  Blood pressure under good control.  He is tolerating Lipitor well without side effects.  On Mini-Mental status exam today she scored 30/30 which is fairly stable compared with 14/30 at last visit. Update 12/10/2021 JM: Patient returns for yearly stroke follow-up.  She unfortunately had recurrent stroke in left MCA not visualized on MRI vs TIA s/p TNK on 08/03/2021 likely due to new onset A-fib after presenting with acute onset confusion, dysarthria and right-sided weakness which improved after TNK.  She was placed on Eliquis 5 mg twice daily.  Reports cognition has worsened since recent stroke. Denies residual weakness or speech difficulty. Denies any new stroke/TIA symptoms.  Remains on Namenda, denies side effects.  Husband notices worsening of memory if misses dose of Namenda.  MMSE today 14/30, was 14/30 1 year ago.  Since recent stroke, husband needs to assist more with ADLs such as bathing (will forget to wash certain areas) or picking out clothing (will dress it appropriately for weather conditions).  He tries to keep her active and socializes routinely.  Husband maintains all IADLs.  She will misplace items frequently around the home.  She will become frustrated/agitated when he questions were a certain item was placed otherwise no significant behavioral concerns.  Denies sleeps well.  Appetite good.    Reports compliance on Eliquis and atorvastatin and Zetia, denies side effects Blood pressure well controlled She has since had follow-up with cardiology and routinely follows with PCP    MRI BRAIN WO CONTRAST 08/05/2021 IMPRESSION: 1. No acute or reversible finding. 2. Chronic small vessel ischemia including remote basal ganglia infarct on the right.  CTA HEAD/NECK 08/03/2021 IMPRESSION: 1. 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 2. Tandem high-grade stenoses in the intracranial right vertebral artery. 3. No other significant stenosis in the neck. 4. No other significant proximal stenosis, aneurysm, or branch vessel occlusion within the Circle of Willis. 5. Aortic Atherosclerosis (ICD10-I70.0).    History provided for reference purposes only Update 12/09/2020 JM: Returns for 1 year follow-up for history of stroke with residual cognitive impairment. She is accompanied by her husband  Overall stable since prior visit -denies new stroke/TIA  symptoms Per husband, cognition continues to slowly decline more so short-term memory MMSE today 14/30 (prior 12/30) Remains on Namenda 10 mg twice daily -denies side effects.  Husband does notice if she misses 1 dose as she will be more agitated and worsening cognition Denies  hallucinations, paranoia, delusions or agitation/aggression.  Sleeps well.  Good appetite. Able to maintain majority of ADLs independently but husband continues to provide 24/7 care and supervision He continues to try to keep her mentally stimulated -will have difficulty at times persuading her to do memory exercises such as games on her iPad as she will become easily frustrated due to difficulty operating iPad. She does not do any routine exercise but husband tries to keep her physically active  Compliant on aspirin and atorvastatin -denies side effects Blood pressure today 167/96 - known elevation at OV -monitors at home and typically ranging 150s/60s  Completed labs 12/2 at PCP office (unable to personally view via epic) - has PCP f/u 12/9  No new concerns at this time  Update 12/07/2019 JM: [name] returns for 52-month stroke follow-up accompanied by her husband.  Cognitive impairment post stroke continues to slowly decline.  MMSE today 12/30 (prior 14/30).  Remains on Namenda with continued benefit subjectively and denies side effects.  He does report occasional perseveration but denies any other behavioral concerns.  Husband provides 24/7 care and supervision with patient continuing to be active as able.  Denies new stroke/TIA symptoms.  On aspirin 325 mg daily and atorvastatin 80 mg daily without side effects.  Recent lipid panel showed LDL 69.  Blood pressure today 164/69 (typically elevated at appointments). Stable at home per husband.  No further concerns at this time.  Update 06/06/2019 JM: [name] returns for stroke follow-up accompanied by her husband.  Residual deficits of cognitive impairment with husband reporting slow decline since prior visit greater with short-term memory, concentration and taking longer to perform normal activities.  He does endorse occasional wandering but no other behavioral concerns.  No evidence of depression or anxiety.  She does require assistance for  IADLs and minimal assistance for ADLs.  MMSE today 14/30 (prior 13/30).  Continues on Namenda 10 mg twice daily with ongoing benefit and denies side effects.  Husband does notice a difference if she misses Namenda dose.  Continues on aspirin 325 mg daily and atorvastatin for secondary stroke prevention.  Lipid panel obtained 1 year prior satisfactory.  No recent lab work.  Blood pressure today elevated at 163/63, asymptomatic.  Blood pressure not routinely monitored at home.  No concerns at this time.  Update 12/06/2018 JM: [name] is a 77 year old female who is being seen today for stroke follow-up accompanied by her husband.  Residual deficits include cognitive impairment.  Was initiated on Namenda at prior visit.  She has continued on Namenda 10 mg daily tolerating well without side effects.  Memory has been overall stable.  Per husband, she did miss a couple doses of Namenda and he did notice a great difference in her cognition.  He tries to encourage her to do memory exercises but she does not do them routinely.  He continues to provide 24/7 care and supervision due to cognitive impairment and safety concerns.  Continues on aspirin 325 mg daily and atorvastatin 80 mg daily for secondary stroke prevention without side effects.  Blood pressure today elevated initially and on recheck 150/80.  Monitor his levels at home which has been stable.  She was evaluated by Dr.  Dohmeier on 03/08/2018 for possible sleep apnea and recommended undergoing sleep testing but has not done so at this time as office has attempted to call twice but unable to contact.  No further concerns at this time.  Update 01/18/2018 Dr. Pearlean Brownie : She returns for follow-up after last visit 2 months ago.  She is accompanied by her husband.  She continues to have cognitive impairment and memory difficulties which are not improving.  She has very poor short-term memory.  She cannot be left alone.  She needs constant reminders and supervision.  She is  unable to do things that she used to do in the past and husband needs to keep a close eye on her.  She did undergo lab work for reversible causes of cognitive impairment on 11/10/2017 and vitamin B12, TSH and RPR were all normal.  EEG done on 12/07/2017 was also normal.  CT angiogram of the brain and neck done on 11/19/2017 showed no significant for current intra-and extracranial stenosis and the previously seen right carotid thrombus had completely resolved.  Patient has poor appetite and has weight loss and hence would not be a good candidate for Aricept.  She has not tried [place] yet.  She is tolerating aspirin well without any bleeding or bruising.  She has stopped Coumadin after his CT Angie results showed resolution of the clot.  Her blood pressure is well controlled and today it is 121/6 3.  The patient was having a dry cough and her primary physician Dr. Jarold Motto plans to change lisinopril to alternative blood pressure medicine.  She remains on Lipitor which is tolerating well without any side effects.  She has not had any recurrent stroke or TIA symptoms.  Initial visit 11/10/2017 Dr. Pearlean Brownie; [name] is a pleasant 77 year old [place] lady who is seen today for initial office consultation visit for stroke.  She is accompanied by her husband.  History is obtained from them and review of electronic medical records.  I have personally reviewed imaging films. HPI ( Dr Wilford Corner ) Sherry Fitzgerald is an 77 y.o. female with a PMH of HTN who presented to the ED with a 2 day history of speech changes, AMS and left face weakness.Per patient family member these symptoms have been intermittent for weeks, but for the past 2 days the patient has had increasing confusion. She is doing odd things like attempting to make coffee w/o coffee grounds. She is becoming more easily agitated and argues about the time. 2 days ago he noticed that the left side of her face seemed different and that she was not speaking  as clearly. Denies any ETOH, or drug abuse. Denies any CP or SOB. Due to the persistence of symptoms and then becoming more frequent as well as the facial droop, there has been decided to bring the patient to the emergency room for evaluation.Noncontrast CT of the head was done that showed a hypodensity suggestive of subacute infarct in the right basal ganglia/caudate.Neurological consultation was obtained for further evaluation. .LKW: 2-4 day ago; uncertain of time NIHSS: 3 MRI scan of the brain showed right frontal deep white matter infarct extending into the head of the caudate with scattered peripheral cortical right frontal infarcts.  MRI of the brain showed a 5 mm distal right MCA aneurysm.  2D echo showed normal ejection fraction without cardiac source of embolus.  LDL cholesterol was elevated at 180 mg percent.  Hemoglobin A1c was 6.1.  CT angiogram of the head  and neck showed right common carotid artery thrombus which was nonocclusive.  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  Patient was felt to have embolized distally from the right common carotid artery thrombus and hence was started on anticoagulation with warfarin and on Lipitor for his elevated lipids.  Patient is currently living at home with her husband.  She has significant cognitive impairment with short-term memory being very poor.  She is also quite slow to process information and has trouble with multitasking.  She requires supervision for activities like cooking and working in the kitchen which she used to love.  She has left the burners on several times while cooking now.  She is tolerating warfarin well without bleeding or bruising and INR has been fairly stable.  Her facial droop and speech appear to have improved.  Patient's husband is wondering when her cognitive difficulties are going to improve and whether she will be able to become fully independent.  She has no prior history of cognitive impairment or memory difficulties.  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     ROS:   14 system review of systems is positive for memory loss, confusion and all other systems negative   PMH:  Past Medical History:  Diagnosis Date   Cerebrovascular accident (CVA) (HCC)    Hypertension    no medications for years    Social History:  Social History   Socioeconomic History   Marital status: Married    Spouse name: Not on file   Number of children: Not on file   Years of education: Not on file   Highest education level: Not on file  Occupational History   Occupation: retired  Tobacco Use   Smoking status: Never   Smokeless tobacco: Never  Substance and Sexual Activity   Alcohol use: Never   Drug use: Never   Sexual activity: Not on file  Other Topics Concern   Not on file  Social History Narrative   Former Editor, commissioning.  Lives alone, but has 5 children in the area who are grown and supportive.  +Financial difficulties - trouble paying bills.  She has been married to her current husband for 3 years.   Social Determinants of Health   Financial Resource Strain: Not on file  Food Insecurity: Not on file  Transportation Needs: Not on file  Physical Activity: Not on file  Stress: Not on file  Social Connections: Not on file  Intimate Partner Violence: Not on file    Medications:   Current Outpatient Medications on File Prior to Visit  Medication Sig Dispense Refill   apixaban (ELIQUIS) 5 MG TABS tablet Take 1 tablet (5 mg total) by mouth 2 (two) times daily. 60 tablet 1   atorvastatin (LIPITOR) 80 MG tablet Take 1 tablet (80 mg total) by mouth daily. 90 tablet 0   ezetimibe (ZETIA) 10 MG tablet Take 10 mg by mouth daily.     losartan (COZAAR) 100 MG tablet Take 1 tablet (100 mg total) by mouth daily. 30 tablet 1   memantine (NAMENDA) 10 MG tablet TAKE 1 TABLET BY MOUTH TWICE A DAY 180 tablet 4   metoprolol tartrate (LOPRESSOR) 25 MG tablet Take  1 tablet (25 mg total) by mouth 2 (two) times daily. 30 tablet 0   Omega-3 Fatty Acids (FISH OIL) 1000 MG CPDR Take 2,000 mg by mouth daily.     No current facility-administered medications on  file prior to visit.    Allergies:  No Known Allergies  Physical Exam  Today's Vitals   07/01/22 1247  BP: 136/64  Pulse: 64  Weight: 119 lb (54 kg)  Height: 4\' 11"  (1.499 m)     Body mass index is 24.04 kg/m.  General: petite frail [place] lady, seated, in no evident distress Head: head normocephalic and atraumatic.   Neck: supple with no carotid or supraclavicular bruits Cardiovascular: regular rate and rhythm, no murmurs Musculoskeletal: no deformity Skin:  no rash/petichiae Vascular:  Normal pulses all extremities  Neurologic Exam Mental Status: Awake and fully alert. No evidence of aphasia or dysarthria.  Follows commands without great difficulty.  Mood and affect appropriate.  Clock drawing 1/4.  Mental status 13/30.    07/01/2022    1:00 PM 12/10/2021    8:45 AM 12/09/2020    8:32 AM  MMSE - Mini Mental State Exam  Orientation to time 0 1 1  Orientation to Place 2 4 4   Registration 3 3 3   Attention/ Calculation 0 0 0  Recall 0 0 0  Language- name 2 objects 2 2 1   Language- repeat 1 1 0  Language- follow 3 step command 3 2 3   Language- follow 3 step command-comments  didnt fold paper   Language- read & follow direction 1 0 1  Language-read & follow direction-comments  read the sentance but didnt close eyes   Write a sentence 1 1 1   Copy design 0 0 0  Total score 13 14 14    Cranial Nerves: Pupils equal, briskly reactive to light. Extraocular movements full without nystagmus. Visual fields full to confrontation. Hearing intact. Facial sensation intact. Face, tongue, palate moves normally and symmetrically.  Motor: Normal bulk and tone. Normal strength in all tested extremity muscles.   Sensory.: intact to touch , pinprick , position and vibratory sensation.   Coordination: Rapid alternating movements normal in all extremities. Finger-to-nose and heel-to-shin performed accurately bilaterally. Gait and Station: Arises from chair without difficulty. Stance is normal. Gait demonstrates normal stride length and balance without use of assistive device Reflexes: 1+ and symmetric. Toes downgoing.       ASSESSMENT/PLAN: 77 year old [place] lady with small stroke not seen on imaging versus TIA s/p TNK on 08/03/2021 likely in setting of new onset A-fib and history of right MCA branch infarct in September 2019 secondary to thromboembolism from right carotid thrombus with significant vascular dementia.  Vascular risk factors of hypertension, atrial fibrillation, carotid thrombus and hyperlipidemia.     I had a long discussion with the patient and her husband regarding her remote history of stroke and dementia both of which appears stable.  Recommend changing to Namenda XR 28 mg daily.  He can afford it otherwise continue Namenda 10 mg twice daily.  Increase participation in cognitively challenging activities.  Continue Eliquis for stroke prevention management and aggressive risk factor modification with strict control of hypertension with blood pressure goal below 140/90, lipids with LDL cholesterol goal below 70 mg percent.Follow-up in the clinic in 6 months with [facility] nurse practitioner or call earlier if necessary.  Greater than 50% time during this 35-minute visit was spent on counseling and coordination of care about her remote stroke and vascular dementia and answering questions Delia Heady, MD  [facility] [address] [place], [place] [address]  Phone [phone] Fax [phone] Note: This document was prepared with digital dictation and possible smart phrase technology. Any transcriptional errors that result from this  process are unintentional.

## 2022-07-01 NOTE — Patient Instructions (Signed)
I had a long discussion with the patient and her husband regarding her remote history of stroke and dementia both of which appears stable.  Recommend changing to Namenda XR 28 mg daily.  He can afford it otherwise continue Namenda 10 mg twice daily.  Increase participation in cognitively challenging activities.  Continue Eliquis for stroke prevention management and aggressive risk factor modification with strict control of hypertension with blood pressure goal below 140/90, lipids with LDL cholesterol goal below 70 mg percent.Follow-up in the clinic in 6 months with Shanda Bumps nurse practitioner or call earlier if necessary

## 2022-07-25 ENCOUNTER — Other Ambulatory Visit: Payer: Self-pay | Admitting: Neurology

## 2022-11-15 ENCOUNTER — Emergency Department (HOSPITAL_BASED_OUTPATIENT_CLINIC_OR_DEPARTMENT_OTHER): Payer: Medicare HMO | Admitting: Radiology

## 2022-11-15 ENCOUNTER — Encounter (HOSPITAL_BASED_OUTPATIENT_CLINIC_OR_DEPARTMENT_OTHER): Payer: Self-pay | Admitting: Emergency Medicine

## 2022-11-15 ENCOUNTER — Other Ambulatory Visit: Payer: Self-pay

## 2022-11-15 ENCOUNTER — Emergency Department (HOSPITAL_BASED_OUTPATIENT_CLINIC_OR_DEPARTMENT_OTHER): Payer: Medicare HMO

## 2022-11-15 ENCOUNTER — Emergency Department (HOSPITAL_BASED_OUTPATIENT_CLINIC_OR_DEPARTMENT_OTHER)
Admission: EM | Admit: 2022-11-15 | Discharge: 2022-11-15 | Disposition: A | Discharge status: Home / Self Care | Payer: Medicare HMO | Attending: Emergency Medicine | Admitting: Emergency Medicine

## 2022-11-15 DIAGNOSIS — N281 Cyst of kidney, acquired: Secondary | ICD-10-CM | POA: Insufficient documentation

## 2022-11-15 DIAGNOSIS — R0781 Pleurodynia: Secondary | ICD-10-CM

## 2022-11-15 DIAGNOSIS — Z9049 Acquired absence of other specified parts of digestive tract: Secondary | ICD-10-CM | POA: Diagnosis not present

## 2022-11-15 DIAGNOSIS — R109 Unspecified abdominal pain: Secondary | ICD-10-CM | POA: Diagnosis not present

## 2022-11-15 LAB — COMPREHENSIVE METABOLIC PANEL
ALT: 19 U/L (ref 0–44)
AST: 25 U/L (ref 15–41)
Albumin: 4.2 g/dL (ref 3.5–5.0)
Alkaline Phosphatase: 75 U/L (ref 38–126)
Anion gap: 10 (ref 5–15)
BUN: 11 mg/dL (ref 8–23)
CO2: 26 mmol/L (ref 22–32)
Calcium: 9.5 mg/dL (ref 8.9–10.3)
Chloride: 105 mmol/L (ref 98–111)
Creatinine, Ser: 1.13 mg/dL — ABNORMAL HIGH (ref 0.44–1.00)
GFR, Estimated: 51 mL/min — ABNORMAL LOW (ref >60)
Glucose, Bld: 105 mg/dL — ABNORMAL HIGH (ref 70–99)
Potassium: 4.2 mmol/L (ref 3.5–5.1)
Sodium: 141 mmol/L (ref 135–145)
Total Bilirubin: 0.9 mg/dL (ref <1.2)
Total Protein: 8.2 g/dL — ABNORMAL HIGH (ref 6.5–8.1)

## 2022-11-15 LAB — URINALYSIS, ROUTINE W REFLEX MICROSCOPIC
Bilirubin Urine: NEGATIVE
Glucose, UA: NEGATIVE mg/dL
Hgb urine dipstick: NEGATIVE
Ketones, ur: NEGATIVE mg/dL
Leukocytes,Ua: NEGATIVE
Nitrite: NEGATIVE
Protein, ur: NEGATIVE mg/dL
Specific Gravity, Urine: 1.012 (ref 1.005–1.030)
pH: 5.5 (ref 5.0–8.0)

## 2022-11-15 LAB — CBC WITH DIFFERENTIAL/PLATELET
Abs Immature Granulocytes: 0.01 10*3/uL (ref 0.00–0.07)
Basophils Absolute: 0 10*3/uL (ref 0.0–0.1)
Basophils Relative: 1 %
Eosinophils Absolute: 0.1 10*3/uL (ref 0.0–0.5)
Eosinophils Relative: 2 %
HCT: 40.1 % (ref 36.0–46.0)
Hemoglobin: 12 g/dL (ref 12.0–15.0)
Immature Granulocytes: 0 %
Lymphocytes Relative: 21 %
Lymphs Abs: 1.3 10*3/uL (ref 0.7–4.0)
MCH: 23.3 pg — ABNORMAL LOW (ref 26.0–34.0)
MCHC: 29.9 g/dL — ABNORMAL LOW (ref 30.0–36.0)
MCV: 77.7 fL — ABNORMAL LOW (ref 80.0–100.0)
Monocytes Absolute: 0.5 10*3/uL (ref 0.1–1.0)
Monocytes Relative: 8 %
Neutro Abs: 4.1 10*3/uL (ref 1.7–7.7)
Neutrophils Relative %: 68 %
Platelets: 207 10*3/uL (ref 150–400)
RBC: 5.16 MIL/uL — ABNORMAL HIGH (ref 3.87–5.11)
RDW: 15 % (ref 11.5–15.5)
WBC: 6 10*3/uL (ref 4.0–10.5)
nRBC: 0 % (ref 0.0–0.2)

## 2022-11-15 LAB — D-DIMER, QUANTITATIVE: D-Dimer, Quant: <0.27 ug{FEU}/mL (ref 0.00–0.50)

## 2022-11-15 LAB — LIPASE, BLOOD: Lipase: 23 U/L (ref 11–51)

## 2022-11-15 LAB — TROPONIN I (HIGH SENSITIVITY): Troponin I (High Sensitivity): 8 ng/L (ref <18)

## 2022-11-15 MED ORDER — LIDOCAINE 5 % EX PTCH: 1.0000 | MEDICATED_PATCH | CUTANEOUS | 0 refills | Status: DC

## 2022-11-15 MED ORDER — FENTANYL CITRATE PF 50 MCG/ML IJ SOSY
50.0000 ug | PREFILLED_SYRINGE | Freq: Once | INTRAMUSCULAR | Status: AC
  Administered 2022-11-15: 50 ug via INTRAVENOUS
  Filled 2022-11-15: qty 1

## 2022-11-15 MED ORDER — ONDANSETRON HCL 4 MG/2ML IJ SOLN
4.0000 mg | Freq: Once | INTRAMUSCULAR | Status: AC
  Administered 2022-11-15: 4 mg via INTRAVENOUS
  Filled 2022-11-15: qty 2

## 2022-11-15 NOTE — Discharge Instructions (Addendum)
You can use Tylenol and the Lidoderm patches for symptomatic relief.  Follow-up closely with your primary care doctor for recheck and also to discuss getting an ultrasound of your left kidney.  There is a cyst on the kidney that needs to be further evaluated.  Return to the emergency room if you have any worsening symptoms.

## 2022-11-15 NOTE — ED Triage Notes (Signed)
C/o right sided flank pain at the base of ribcage, worse with inhalation, per spouse they have been moving over the past week

## 2022-11-15 NOTE — ED Provider Notes (Addendum)
Care was taken over from Dr. Clayborne Dana.  Patient is a 77 year old who presented with some pain along her right lower ribs.  It is focally reproducible with tenderness of the lower right chest wall, no external lesions are noted.  No crepitus or deformity.  X-rays of the right ribs did not show any evidence of rib fractures or underlying pneumonia.  CT scan was performed.  There is no acute abnormalities.  There is a cystic structure on the left kidney which will need further outpatient follow-up.  I discussed these findings with the patient and her husband.  He says that they have been moving recently and she has had a couple falls over on that side and thinks that she may have hurt her ribs.  It does seem to be focally tender in 1 area.  She otherwise appears stable and her pain is well-controlled here in the ED.  Her heart rate is noted to be low, mostly in the low 40s.  When I was in the room, it was 44.  On chart review, she has a chronically low heart rate and it is frequently in the 40s on other office visits, including a cardiology visit last year.  She is not symptomatic.  Her blood pressures remained stable.  Her other labs are reviewed and are nonconcerning.  We discussed pain management options.  She has Tylenol at home which she has been taking.  She is on Eliquis so she is not able to take any NSAIDs.  They at this point do not want to try any stronger medications such as hydrocodone or oxycodone.  Discussed possible Lidoderm patches and they are amenable to trying this.  Encouraged them to have a close follow-up appointment with her PCP.  Discussed that she will need to have further follow-up on the kidney cyst.  Return precautions were given.   Rolan Bucco, MD 11/15/22 [id number]    Rolan Bucco, MD 11/15/22 2841    Rolan Bucco, MD 11/15/22 [phone]

## 2022-11-16 NOTE — ED Provider Notes (Signed)
[place] EMERGENCY DEPARTMENT AT [facility] Provider Note   CSN: [id number] Arrival date & time: 11/15/22  0424     History  Chief Complaint  Patient presents with   Flank Pain    Sherrine Rediet Zittel is a 77 y.o. female.  77 year old female who presents ER today secondary to right sided rib/flank pain.  Patient has dementia and does not remember recent events much as her husband provides most history.  States that she has fallen a couple times recently and has been moving stuff around recently and today is complaining of right rib pain.  Patient does not remember any of this.  Hurts worse when she takes a deep breath.  No recent fever or cough.  No lower extremity swelling.  Has history of multiple strokes.   Flank Pain      Home Medications Prior to Admission medications   Medication Sig Start Date End Date Taking? Authorizing Provider  lidocaine (LIDODERM) 5 % Place 1 patch onto the skin daily. Remove & Discard patch within 12 hours or as directed by MD 11/15/22  Yes Rolan Bucco, MD  apixaban (ELIQUIS) 5 MG TABS tablet Take 1 tablet (5 mg total) by mouth 2 (two) times daily. 08/05/21   de Saintclair Halsted, Cortney E, NP  atorvastatin (LIPITOR) 80 MG tablet Take 1 tablet (80 mg total) by mouth daily. 11/08/17   Myrene Buddy, MD  ezetimibe (ZETIA) 10 MG tablet Take 10 mg by mouth daily. 06/09/21   [provider]  losartan (COZAAR) 100 MG tablet Take 1 tablet (100 mg total) by mouth daily. 08/06/21   de Saintclair Halsted, Cortney E, NP  memantine (NAMENDA XR) 28 MG CP24 24 hr capsule TAKE 1 CAPSULE BY MOUTH DAILY. 07/27/22   Micki Riley, MD  memantine (NAMENDA) 10 MG tablet TAKE 1 TABLET BY MOUTH TWICE A DAY 10/14/21   Ihor Austin, NP  metoprolol tartrate (LOPRESSOR) 25 MG tablet Take 1 tablet (25 mg total) by mouth 2 (two) times daily. 08/05/21   de Saintclair Halsted, Cortney E, NP  Omega-3 Fatty Acids (FISH OIL) 1000 MG CPDR Take 2,000 mg by mouth daily.    [provider]       Allergies    Patient has no known allergies.    Review of Systems   Review of Systems  Genitourinary:  Positive for flank pain.    Physical Exam Updated Vital Signs BP (!) 124/106   Pulse (!) 39   Temp 98.1 F (36.7 C)   Resp 16   Ht 4\' 11"  (1.499 m)   Wt 54 kg   SpO2 99%   BMI 24.04 kg/m  Physical Exam Vitals and nursing note reviewed.  Constitutional:      Appearance: She is well-developed.  HENT:     Head: Normocephalic and atraumatic.  Cardiovascular:     Rate and Rhythm: Normal rate and regular rhythm.  Pulmonary:     Effort: No respiratory distress.     Breath sounds: No stridor. No wheezing or rales.  Abdominal:     General: There is no distension.  Musculoskeletal:        General: Tenderness (Right anterior and posterior ribs.  No real abdominal pain.) present.     Cervical back: Normal range of motion.  Neurological:     Mental Status: She is alert.    ED Results / Procedures / Treatments   Labs (all labs ordered are listed, but only abnormal results are displayed) Labs  Reviewed  CBC WITH DIFFERENTIAL/PLATELET - Abnormal; Notable for the following components:      Result Value   RBC 5.16 (*)    MCV 77.7 (*)    MCH 23.3 (*)    MCHC 29.9 (*)    All other components within normal limits  COMPREHENSIVE METABOLIC PANEL - Abnormal; Notable for the following components:   Glucose, Bld 105 (*)    Creatinine, Ser 1.13 (*)    Total Protein 8.2 (*)    GFR, Estimated 51 (*)    All other components within normal limits  LIPASE, BLOOD  URINALYSIS, ROUTINE W REFLEX MICROSCOPIC  D-DIMER, QUANTITATIVE  TROPONIN I (HIGH SENSITIVITY)    EKG EKG Interpretation Date/Time:  Sunday November 15 2022 05:06:53 EST Ventricular Rate:  139 PR Interval:  142 QRS Duration:  149 QT Interval:  281 QTC Calculation: 264 R Axis:   75  Text Interpretation: Marked sinus bradycardia Ventricular bigeminy Left bundle branch block Reconfirmed by Marily Memos [id number]  on 11/15/2022 7:01:55 AM  Radiology CT Renal Stone Study  Result Date: 11/15/2022 CLINICAL DATA:  Acute right flank pain. EXAM: CT ABDOMEN AND PELVIS WITHOUT CONTRAST TECHNIQUE: Multidetector CT imaging of the abdomen and pelvis was performed following the standard protocol without IV contrast. RADIATION DOSE REDUCTION: This exam was performed according to the departmental dose-optimization program which includes automated exposure control, adjustment of the mA and/or kV according to patient size and/or use of iterative reconstruction technique. COMPARISON:  December 06, 2007. FINDINGS: Lower chest: No acute abnormality. Hepatobiliary: No focal liver abnormality is seen. Status post cholecystectomy. No biliary dilatation. Pancreas: Unremarkable. No pancreatic ductal dilatation or surrounding inflammatory changes. Spleen: Normal in size without focal abnormality. Adrenals/Urinary Tract: Adrenal glands appear normal. 12 mm partially exophytic low density is seen arising from midpole of left kidney. No hydronephrosis or renal obstruction is noted. Urinary bladder is unremarkable. Stomach/Bowel: Stomach is within normal limits. Appendix appears normal. No evidence of bowel wall thickening, distention, or inflammatory changes. Vascular/Lymphatic: Aortic atherosclerosis. Focal saccular dilatation of infrarenal abdominal aorta is noted but it has a maximum measured diameter of 2.5 cm and therefore is not aneurysmal at this time. No enlarged abdominal or pelvic lymph nodes. Reproductive: Uterus and bilateral adnexa are unremarkable. Other: No abdominal wall hernia or abnormality. No abdominopelvic ascites. Musculoskeletal: No acute or significant osseous findings. IMPRESSION: 12 mm partially exophytic low density is seen arising from midpole of left kidney. This may simply represent cyst, but renal ultrasound is recommended for confirmation and to rule out neoplasm. No other abnormality seen in the abdomen or pelvis.  Aortic Atherosclerosis (ICD10-I70.0). Electronically Signed   By: Lupita Raider M.D.   On: 11/15/2022 08:18   DG Ribs Unilateral W/Chest Right  Result Date: 11/15/2022 CLINICAL DATA:  Right-sided flank pain at the base of the ribs. EXAM: RIGHT RIBS AND CHEST - 3+ VIEW COMPARISON:  None Available. FINDINGS: Radio-opaque marker has been placed on the skin at the site of patient concern. The lungs are clear without focal pneumonia, edema, pneumothorax or pleural effusion. The cardiopericardial silhouette is within normal limits for size. Bones are demineralized. Oblique views of the right ribs show no evidence for an acute displaced right-sided rib fracture. IMPRESSION: 1. Negative. Electronically Signed   By: Kennith Center M.D.   On: 11/15/2022 06:57    Procedures Procedures    Medications Ordered in ED Medications  fentaNYL (SUBLIMAZE) injection 50 mcg (50 mcg Intravenous Given 11/15/22 0642)  ondansetron (ZOFRAN) injection 4  mg (4 mg Intravenous Given 11/15/22 0753)    ED Course/ Medical Decision Making/ A&P                                 Medical Decision Making Amount and/or Complexity of Data Reviewed Labs: ordered. Radiology: ordered. ECG/medicine tests: ordered.  Risk Prescription drug management.   Initial monitor her QRS was quite wide and she was bradycardic with Toldt T waves.  This in association with her possible flank pain may be due to a low bit more broad workup to ensure that her kidneys were fine and that she did not have a PE or any kind of cardiac issues.  On review of the record she has had multiple outpatient visits and hospitalizations with heart rates in the low 40s similar to today.  Her morphology definitely change that she used to be in a narrow complex.  However no left-sided chest pain or any other ACS like symptoms and troponins negative so I think is unlikely related to a cardiac problem.  D-dimer was negative so unlikely to be a PE with no other real  risk factors also while on anticoagulation.  Her pain is directly over her ribs as no fracture seen on the x-ray.  Her creatinine is a bit elevated compared to her baseline.  Will get a stone study just to make sure that it is not that radiating.  Low suspicion for liver pathology with normal labs. Pain medications seem to have helped. HDS otherwise. Care transferred pending ct scan and reevaluation.    Final Clinical Impression(s) / ED Diagnoses Final diagnoses:  Rib pain on right side  Cyst of left kidney    Rx / DC Orders ED Discharge Orders          Ordered    lidocaine (LIDODERM) 5 %  Every 24 hours        11/15/22 0902              Alverna Fawley, Barbara Cower, MD 11/16/22 0246

## 2022-12-02 ENCOUNTER — Telehealth: Payer: Self-pay | Admitting: *Deleted

## 2022-12-02 NOTE — Telephone Encounter (Signed)
Transition Care Management Unsuccessful Follow-up Telephone Call  Date of discharge and from where:  [facility]  11/15/2022  Attempts:  1st Attempt  Reason for unsuccessful TCM follow-up call:  No answer/busy

## 2022-12-09 ENCOUNTER — Telehealth: Payer: Self-pay | Admitting: *Deleted

## 2022-12-09 NOTE — Telephone Encounter (Signed)
Transition Care Management Unsuccessful Follow-up Telephone Call  Date of discharge and from where:  [facility]  11/15/2022  Attempts:  2nd Attempt  Reason for unsuccessful TCM follow-up call:  No answer/busy

## 2022-12-21 ENCOUNTER — Other Ambulatory Visit: Payer: Self-pay

## 2022-12-21 ENCOUNTER — Emergency Department (HOSPITAL_COMMUNITY)
Admission: EM | Admit: 2022-12-21 | Discharge: 2022-12-21 | Disposition: A | Discharge status: Home / Self Care | Payer: Medicare HMO | Attending: Emergency Medicine | Admitting: Emergency Medicine

## 2022-12-21 ENCOUNTER — Emergency Department (HOSPITAL_COMMUNITY): Payer: Medicare HMO

## 2022-12-21 ENCOUNTER — Encounter (HOSPITAL_COMMUNITY): Payer: Self-pay | Admitting: Emergency Medicine

## 2022-12-21 DIAGNOSIS — I10 Essential (primary) hypertension: Secondary | ICD-10-CM | POA: Diagnosis not present

## 2022-12-21 DIAGNOSIS — R079 Chest pain, unspecified: Secondary | ICD-10-CM | POA: Diagnosis not present

## 2022-12-21 DIAGNOSIS — Z79899 Other long term (current) drug therapy: Secondary | ICD-10-CM | POA: Insufficient documentation

## 2022-12-21 DIAGNOSIS — R42 Dizziness and giddiness: Secondary | ICD-10-CM | POA: Diagnosis not present

## 2022-12-21 DIAGNOSIS — I959 Hypotension, unspecified: Secondary | ICD-10-CM | POA: Diagnosis not present

## 2022-12-21 DIAGNOSIS — R55 Syncope and collapse: Secondary | ICD-10-CM | POA: Diagnosis not present

## 2022-12-21 DIAGNOSIS — E875 Hyperkalemia: Secondary | ICD-10-CM | POA: Insufficient documentation

## 2022-12-21 DIAGNOSIS — R001 Bradycardia, unspecified: Secondary | ICD-10-CM | POA: Diagnosis not present

## 2022-12-21 DIAGNOSIS — I700 Atherosclerosis of aorta: Secondary | ICD-10-CM | POA: Diagnosis not present

## 2022-12-21 DIAGNOSIS — Z7901 Long term (current) use of anticoagulants: Secondary | ICD-10-CM | POA: Diagnosis not present

## 2022-12-21 DIAGNOSIS — R61 Generalized hyperhidrosis: Secondary | ICD-10-CM | POA: Diagnosis not present

## 2022-12-21 DIAGNOSIS — Z8673 Personal history of transient ischemic attack (TIA), and cerebral infarction without residual deficits: Secondary | ICD-10-CM | POA: Diagnosis not present

## 2022-12-21 LAB — MAGNESIUM: Magnesium: 2.4 mg/dL (ref 1.7–2.4)

## 2022-12-21 LAB — CBC WITH DIFFERENTIAL/PLATELET
Abs Immature Granulocytes: 0.02 10*3/uL (ref 0.00–0.07)
Basophils Absolute: 0 10*3/uL (ref 0.0–0.1)
Basophils Relative: 1 %
Eosinophils Absolute: 0.1 10*3/uL (ref 0.0–0.5)
Eosinophils Relative: 1 %
HCT: 43 % (ref 36.0–46.0)
Hemoglobin: 12.5 g/dL (ref 12.0–15.0)
Immature Granulocytes: 0 %
Lymphocytes Relative: 18 %
Lymphs Abs: 1 10*3/uL (ref 0.7–4.0)
MCH: 23.2 pg — ABNORMAL LOW (ref 26.0–34.0)
MCHC: 29.1 g/dL — ABNORMAL LOW (ref 30.0–36.0)
MCV: 79.8 fL — ABNORMAL LOW (ref 80.0–100.0)
Monocytes Absolute: 0.4 10*3/uL (ref 0.1–1.0)
Monocytes Relative: 8 %
Neutro Abs: 4 10*3/uL (ref 1.7–7.7)
Neutrophils Relative %: 72 %
Platelets: 218 10*3/uL (ref 150–400)
RBC: 5.39 MIL/uL — ABNORMAL HIGH (ref 3.87–5.11)
RDW: 14.4 % (ref 11.5–15.5)
WBC: 5.6 10*3/uL (ref 4.0–10.5)
nRBC: 0 % (ref 0.0–0.2)

## 2022-12-21 LAB — BASIC METABOLIC PANEL
Anion gap: 10 (ref 5–15)
BUN: 29 mg/dL — ABNORMAL HIGH (ref 8–23)
CO2: 24 mmol/L (ref 22–32)
Calcium: 9.1 mg/dL (ref 8.9–10.3)
Chloride: 107 mmol/L (ref 98–111)
Creatinine, Ser: 1.51 mg/dL — ABNORMAL HIGH (ref 0.44–1.00)
GFR, Estimated: 36 mL/min — ABNORMAL LOW (ref >60)
Glucose, Bld: 94 mg/dL (ref 70–99)
Potassium: 5.4 mmol/L — ABNORMAL HIGH (ref 3.5–5.1)
Sodium: 141 mmol/L (ref 135–145)

## 2022-12-21 LAB — TROPONIN I (HIGH SENSITIVITY): Troponin I (High Sensitivity): 8 ng/L (ref <18)

## 2022-12-21 LAB — TSH: TSH: 1.264 u[IU]/mL (ref 0.350–4.500)

## 2022-12-21 LAB — CBG MONITORING, ED: Glucose-Capillary: 80 mg/dL (ref 70–99)

## 2022-12-21 LAB — LACTIC ACID, PLASMA: Lactic Acid, Venous: 1 mmol/L (ref 0.5–1.9)

## 2022-12-21 MED ORDER — SODIUM CHLORIDE 0.9 % IV BOLUS
1000.0000 mL | Freq: Once | INTRAVENOUS | Status: AC
Start: 2022-12-21 — End: 2022-12-21
  Administered 2022-12-21: 1000 mL via INTRAVENOUS

## 2022-12-21 NOTE — ED Provider Notes (Signed)
Patient care assumed from previous provider.   Patient care of Sherry Fitzgerald is a 77 y.o. female from previous provider. Please see the original provider note from this emergency department encounter for full history and physical.   Course of Care and my assessment at the time of sign out is detailed in the ED Course below.   Clinical Course as of 12/21/22 1515  Mon Dec 21, 2022  1507 Stable 50F stroke, htn, doesn't take any medicine. dizzy today presyncope, sat down. No trauma. Heart rate low, BP low. Currently asymptomatic. IF fluids to address creatine. EKG no changes from baseline. Not endorsing UA.   Second troponin and tsh. Ambulatory trial. Orthostatics pending.  [JL]    Clinical Course User Index [JL] Gunnar Bulla, MD     Labs reviewed by myself and considered in medical decision making.  Imaging reviewed by myself and considered in medical decision making. Imaging final read interpreted by radiology.  No diagnosis found.   Is a 77 year old female, history of stroke, was signed out to me pending a TSH, and ambulatory trial.  Patient's TSH was within normal limits, she is able to ambulate, she is answering questions appropriately, her son-in-law and husband are at the bedside, they help take care of her.  Her heart rate is low, I do not think this is the cause of her symptomatic dizziness, her symptoms have now all resolved, she is back at her baseline, I think that she was mostly dehydrated, we have given her fluids, she is eating, drinking, tolerating p.o. intake, all of the labs reviewed, no anemia, creatinine slightly high at 1.51, will follow-up with outpatient on that, chest x-ray clear without acute cardiopulmonary process.  Hemodynamics have been stable for a prolonged period of time while in the emergency department.  Will discharge the patient at this time and she will follow-up.  A blood culture was obtained upfront due to concern for sepsis initially, the patient is not  septic, this will be followed up on  The plan for this patient was discussed with Dr. Blane Ohara, MD , who voiced agreement and who oversaw evaluation and treatment of this patient.    Gunnar Bulla, MD 12/21/22 Valrie Hart    Blane Ohara, MD 12/22/22 [phone]

## 2022-12-21 NOTE — Discharge Instructions (Addendum)
I appreciate you been seen here in the emergency department for faintness, we have done extensive blood work, and it all looks good at this time.  Please follow-up with your primary care provider and if any changes please come back to the emergency department.  Your heart rate is low, but it appears it has been low in the past, follow-up with your regular doctor

## 2022-12-21 NOTE — ED Notes (Signed)
2nd set of blood cultures not obtained by previous RN prior to d/c.

## 2022-12-21 NOTE — ED Notes (Signed)
NT ambulated pt NT asked pt was she feeling dizzy pt stated no she felt fine. Ambulated pt around blue zone with no problems O2 at 100%

## 2022-12-21 NOTE — ED Triage Notes (Signed)
PT BIB [facility] for near syncope with bradycardia and hypotension.  Pt was helping helping her husband hang some pictures when she became dizzy, pale and diaphoretic.  Husband helped her to ground.  On scene, fire got BP of 90/40.  En route pt BP was 84/36, HR 40.  EMS pushed 1 mg atropine as they were entering ambulance bay and quickly got BP 146/87 HR 80.  PT also received 250 mL of NS.    100% RA, CBG 187

## 2022-12-21 NOTE — ED Provider Notes (Signed)
[place] EMERGENCY DEPARTMENT AT [facility] Provider Note   CSN: [id number] Arrival date & time: 12/21/22  1042     History  Chief Complaint  Patient presents with   Near Syncope   Bradycardia   Hypotension    Sherry Fitzgerald is a 77 y.o. female with PMHx CVA, HTN, afib who presents to ED concerned for near syncope. Patient became dizzy while handing pictures today. Husband was able to help to the ground. No head trauma, LOC, seizures. Patient denies taking any medication daily.  EMS noted hypotension eyegrounds 84/36 and heart rate around 40.  These vitals resolved with 1 mg atropine and NS. Patient stating that she currently feels good. Denying any symptoms at this time.  Patient is prescribed multiple medications such as eliquis and antihypertensives which she declines taking for at least the past 5-6 months.  Denies fever, chest pain, dyspnea, cough, nausea, vomiting, diarrhea, dysuria, hematuria.   Near Syncope       Home Medications Prior to Admission medications   Medication Sig Start Date End Date Taking? Authorizing Provider  apixaban (ELIQUIS) 5 MG TABS tablet Take 1 tablet (5 mg total) by mouth 2 (two) times daily. 08/05/21   de Saintclair Halsted, Cortney E, NP  atorvastatin (LIPITOR) 80 MG tablet Take 1 tablet (80 mg total) by mouth daily. 11/08/17   Myrene Buddy, MD  ezetimibe (ZETIA) 10 MG tablet Take 10 mg by mouth daily. 06/09/21   [provider]  lidocaine (LIDODERM) 5 % Place 1 patch onto the skin daily. Remove & Discard patch within 12 hours or as directed by MD 11/15/22   Rolan Bucco, MD  losartan (COZAAR) 100 MG tablet Take 1 tablet (100 mg total) by mouth daily. 08/06/21   de Saintclair Halsted, Cortney E, NP  memantine (NAMENDA XR) 28 MG CP24 24 hr capsule TAKE 1 CAPSULE BY MOUTH DAILY. 07/27/22   Micki Riley, MD  memantine (NAMENDA) 10 MG tablet TAKE 1 TABLET BY MOUTH TWICE A DAY 10/14/21   Ihor Austin, NP  metoprolol tartrate  (LOPRESSOR) 25 MG tablet Take 1 tablet (25 mg total) by mouth 2 (two) times daily. 08/05/21   de Saintclair Halsted, Cortney E, NP  Omega-3 Fatty Acids (FISH OIL) 1000 MG CPDR Take 2,000 mg by mouth daily.    [provider]      Allergies    Patient has no known allergies.    Review of Systems   Review of Systems  Cardiovascular:  Positive for near-syncope.    Physical Exam Updated Vital Signs BP (!) 128/57   Pulse 67   Temp (!) 97.5 F (36.4 C) (Oral)   Resp 18   SpO2 100%  Physical Exam Vitals and nursing note reviewed.  Constitutional:      General: She is not in acute distress.    Appearance: She is not ill-appearing or toxic-appearing.  HENT:     Head: Normocephalic and atraumatic.     Mouth/Throat:     Mouth: Mucous membranes are moist.     Pharynx: No oropharyngeal exudate or posterior oropharyngeal erythema.  Eyes:     General: No scleral icterus.       Right eye: No discharge.        Left eye: No discharge.     Conjunctiva/sclera: Conjunctivae normal.  Cardiovascular:     Rate and Rhythm: Normal rate and regular rhythm.     Pulses: Normal pulses.     Heart sounds:  Normal heart sounds. No murmur heard. Pulmonary:     Effort: Pulmonary effort is normal. No respiratory distress.     Breath sounds: Normal breath sounds. No wheezing, rhonchi or rales.  Abdominal:     General: Abdomen is flat. Bowel sounds are normal. There is no distension.     Palpations: Abdomen is soft. There is no mass.     Tenderness: There is no abdominal tenderness.  Musculoskeletal:     Right lower leg: No edema.     Left lower leg: No edema.  Skin:    General: Skin is warm and dry.     Findings: No rash.  Neurological:     General: No focal deficit present.     Mental Status: She is alert and oriented to person, place, and time. Mental status is at baseline.     Comments: GCS 15. Speech is goal oriented. No deficits appreciated to CN III-XII; symmetric eyebrow raise, no facial  drooping, tongue midline. Patient has equal grip strength bilaterally with 5/5 strength against resistance in all major muscle groups bilaterally. Sensation to light touch intact. Patient moves extremities without ataxia. Patient ambulatory with steady gait.   Psychiatric:        Mood and Affect: Mood normal.        Behavior: Behavior normal.     ED Results / Procedures / Treatments   Labs (all labs ordered are listed, but only abnormal results are displayed) Labs Reviewed  CULTURE, BLOOD (ROUTINE X 2)  CULTURE, BLOOD (ROUTINE X 2)  BASIC METABOLIC PANEL  CBC WITH DIFFERENTIAL/PLATELET  MAGNESIUM  TSH  LACTIC ACID, PLASMA  LACTIC ACID, PLASMA  CBG MONITORING, ED  TROPONIN I (HIGH SENSITIVITY)    EKG EKG Interpretation Date/Time:  Monday December 21 2022 10:53:03 EST Ventricular Rate:  69 PR Interval:  151 QRS Duration:  153 QT Interval:  459 QTC Calculation: 492 R Axis:   55  Text Interpretation: Sinus rhythm Left bundle branch block No sig change from prior tracing Nov 15 2022 Confirmed by Alvester Chou [phone]) on 12/21/2022 11:09:27 AM  Radiology No results found.  Procedures Procedures    Medications Ordered in ED Medications - No data to display  ED Course/ Medical Decision Making/ A&P Clinical Course as of 12/21/22 1515  Mon Dec 21, 2022  1507 Stable 55F stroke, htn, doesn't take any medicine. dizzy today presyncope, sat down. No trauma. Heart rate low, BP low. Currently asymptomatic. IF fluids to address creatine. EKG no changes from baseline. Not endorsing UA.   Second troponin and tsh. Ambulatory trial. Orthostatics pending.  [JL]    Clinical Course User Index [JL] Gunnar Bulla, MD                                 Medical Decision Making Amount and/or Complexity of Data Reviewed Labs: ordered. Radiology: ordered.   This patient presents to the ED for concern of dizziness, bradycardia, hypotension, this involves an extensive number of treatment  options, and is a complaint that carries with it a high risk of complications and morbidity.  The differential diagnosis includes ACS, heart failure, medication side effect, hypothyroidism, electrolyte imbalance, sepsis   Co morbidities that complicate the patient evaluation  CVA, HTN   Additional history obtained:  Dr. Eloise Harman PCP Unable to look at patient's past cardiology note and appears that she also had bradycardia with heart rate in the 40s at that time.  It appears that her bradycardia is baseline for patient.   Lab Tests:  I Ordered, and personally interpreted labs.  The pertinent results include:   -NGE:XBMWUXL -CBC: No concern for anemia or leukocytosis -Mag: within normal limits -BMP: mild hyperkalemia at 5.4; mild elevation of BUN/Cr at 29/1.51 -LA: within normal limits -trop: initial within normal limits -CBG: 80 -blood cultures: pending   Imaging Studies ordered:  I ordered imaging studies including  -chest xray: To assess for process contributing patient's symptoms I independently visualized and interpreted imaging I agree with the radiologist interpretation   Cardiac Monitoring: / EKG:  The patient was maintained on a cardiac monitor.  I personally viewed and interpreted the cardiac monitored which showed an underlying rhythm of: No significant change from prior EKG   Problem List / ED Course / Critical interventions / Medication management  Patient presents to ED concern for episode of near syncope, hypotension, and bradycardia.  Patient received 1 mg atropine and 253 mL NS by EMS.  Patient's initial vitals were reassuring.  Patient denying any infectious complaints today.  Patient stating that she does not take medicine daily.  Patient denies any other prodromal symptoms prior to her new syncopal episode besides the dizziness. Chart review showing that patient does have bradycardia at baseline.  It appears that patient's heart rate is usually in the  upper 40s and lower 50s. CBC without leukocytosis or anemia.  BMP does have mild elevation of potassium, BUN, and creatinine.  Provided patient with IV fluid for possible mild dehydration.  Lactic acid within normal limits.  Initial troponin within normal limits.  Second opponent pending.  CBG at 80.  Magnesium within normal limits.  Chest x-ray without cardiopulmonary disease.  EKG with no changes from baseline. I have reviewed the patients home medicines and have made adjustments as needed   Social Determinants of Health:  none   3:19 PM Care of Tinleigh S Kon Nash transferred to Dr. Miguel Dibble at the end of my shift as the patient will require reassessment once labs/imaging have resulted. Patient presentation, ED course, and plan of care discussed with review of all pertinent labs and imaging. Please see his/her note for further details regarding further ED course and disposition. Plan at time of handoff is reassess patient after IV fluids, repeat troponin, orthostatic vital signs. I expect patient may be eligible for outpatient follow up with PCP and cardiologist. This may be altered or completely changed at the discretion of the oncoming team pending results of further workup.         Final Clinical Impression(s) / ED Diagnoses Final diagnoses:  None    Rx / DC Orders ED Discharge Orders     None         Dorthy Cooler, [place] 12/21/22 1520    Terald Sleeper, MD 12/21/22 [phone]

## 2022-12-26 LAB — CULTURE, BLOOD (ROUTINE X 2)
Culture: NO GROWTH
Special Requests: ADEQUATE

## 2023-01-13 NOTE — Progress Notes (Signed)
 [facility] [address] [place].  [address] [phone]       OFFICE FOLLOW UP VISIT NOTE  Ms. Caty Tessler Date of Birth:  Dec 30, 1945 Medical Record Number:  [id number]   Primary neurologist: Dr. Rosemarie Reason for visit: Stroke and dementia follow-up   Chief complaint: Chief Complaint  Patient presents with   Follow-up    Pt in 3 with husband Husband states pt short term memory is a little worse        HPI:  Update 01/14/2023 JM: Patient returns for 76-month stroke follow-up accompanied by her husband.  At prior visit with Dr. Rosemarie, reported continued cognitive decline, recommended switching memantine  10 mg BID to Namenda  XR 28mg  daily.   Husband provides majority of history.  He reports very slight cognitive decline since prior visit, more so in regards to short-term memory.  MMSE today 10/30 (prior 13/30).  Has been tolerating Namenda  XR well without side effects and believes this has been helpful.  No behavioral concerns. Did move to a new apartment a couple months ago, she has had some difficulty finding things around the apartment and remembering more certain things are but this has been gradually improving. She is unable to travel as this causes confusion. Sleeps well at night, will nap during the day. Good appetite.  Husband has been working on increasing water intake.  She was seen in the ED last month after presyncopal episode suspected due to dehydration, slight increase in creatinine from 1.13 to 1.51. Has not had repeat labs.  No additional presyncopal events.  Husband ensures that she gets out of the house daily, they remain very active with different veterans events, he tries to encourage exercise.  Stable from stroke standpoint, no new stroke/TIA symptoms.  Remains on Eliquis  5 mg twice daily and atorvastatin  and Zetia .  Blood pressure well-controlled.  Routinely follows with PCP Dr. Yolande.     History provided for reference purposes  only Update 07/01/2022 Dr. Rosemarie: She returns for follow-up after last visit 6 months ago.  She is accompanied by her husband.  She continues to have memory and cognitive difficulties.  Subsequently memory getting worse.  He is now getting to not being able to answer the phone currently.  She does have some sundowning and gets confused during the evening time.  There are no agitation or violent behavior.  She does not wander off.  She does not have any delusions or hallucinations.  She can recognize her children but has trouble recognizing her grandchildren and other family members.  Memantine  10 mg twice daily which is tolerating well without side effects.  On Eliquis  tolerating well without bruising or bleeding.  Blood pressure under good control.  He is tolerating Lipitor  well without side effects.  On Mini-Mental status exam today she scored 30/30 which is fairly stable compared with 14/30 at last visit.    Update 12/10/2021 JM: Patient returns for yearly stroke follow-up.  She unfortunately had recurrent stroke in left MCA not visualized on MRI vs TIA s/p TNK on 08/03/2021 likely due to new onset A-fib after presenting with acute onset confusion, dysarthria and right-sided weakness which improved after TNK.  She was placed on Eliquis  5 mg twice daily.  Reports cognition has worsened since recent stroke. Denies residual weakness or speech difficulty. Denies any new stroke/TIA symptoms. Remains on Namenda , denies side effects.  Husband notices worsening of memory if misses dose of Namenda .  MMSE today 14/30, was 14/30 1 year  ago.  Since recent stroke, husband needs to assist more with ADLs such as bathing (will forget to wash certain areas) or picking out clothing (will dress it appropriately for weather conditions).  He tries to keep her active and socializes routinely.  Husband maintains all IADLs.  She will misplace items frequently around the home.  She will become frustrated/agitated when he questions  were a certain item was placed otherwise no significant behavioral concerns.  Denies sleeps well.  Appetite good.    Reports compliance on Eliquis  and atorvastatin  and Zetia , denies side effects Blood pressure well controlled She has since had follow-up with cardiology and routinely follows with PCP    MRI BRAIN WO CONTRAST 08/05/2021 IMPRESSION: 1. No acute or reversible finding. 2. Chronic small vessel ischemia including remote basal ganglia infarct on the right.  CTA HEAD/NECK 08/03/2021 IMPRESSION: 1. 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 2. Tandem high-grade stenoses in the intracranial right vertebral artery. 3. No other significant stenosis in the neck. 4. No other significant proximal stenosis, aneurysm, or branch vessel occlusion within the Circle of Willis. 5. Aortic Atherosclerosis (ICD10-I70.0).  Update 12/09/2020 JM: Returns for 1 year follow-up for history of stroke with residual cognitive impairment. She is accompanied by her husband  Overall stable since prior visit -denies new stroke/TIA symptoms Per husband, cognition continues to slowly decline more so short-term memory MMSE today 14/30 (prior 12/30) Remains on Namenda  10 mg twice daily -denies side effects.  Husband does notice if she misses 1 dose as she will be more agitated and worsening cognition Denies hallucinations, paranoia, delusions or agitation/aggression.  Sleeps well.  Good appetite. Able to maintain majority of ADLs independently but husband continues to provide 24/7 care and supervision He continues to try to keep her mentally stimulated -will have difficulty at times persuading her to do memory exercises such as games on her iPad as she will become easily frustrated due to difficulty operating iPad. She does not do any routine exercise but husband tries to keep her physically active  Compliant on aspirin  and atorvastatin  -denies side effects Blood pressure  today 167/96 - known elevation at OV -monitors at home and typically ranging 150s/60s  Completed labs 12/2 at PCP office (unable to personally view via epic) - has PCP f/u 12/9  No new concerns at this time  Update 12/07/2019 JM: Ms. Arretta Toenjes returns for 65-month stroke follow-up accompanied by her husband.  Cognitive impairment post stroke continues to slowly decline.  MMSE today 12/30 (prior 14/30).  Remains on Namenda  with continued benefit subjectively and denies side effects.  He does report occasional perseveration but denies any other behavioral concerns.  Husband provides 24/7 care and supervision with patient continuing to be active as able.  Denies new stroke/TIA symptoms.  On aspirin  325 mg daily and atorvastatin  80 mg daily without side effects.  Recent lipid panel showed LDL 69.  Blood pressure today 164/69 (typically elevated at appointments). Stable at home per husband.  No further concerns at this time.  Update 06/06/2019 JM: Ms. Toniette Devera returns for stroke follow-up accompanied by her husband.  Residual deficits of cognitive impairment with husband reporting slow decline since prior visit greater with short-term memory, concentration and taking longer to perform normal activities.  He does endorse occasional wandering but no other behavioral concerns.  No evidence of depression or anxiety.  She does require assistance for IADLs and minimal assistance for ADLs.  MMSE today 14/30 (prior 13/30).  Continues  on Namenda  10 mg twice daily with ongoing benefit and denies side effects.  Husband does notice a difference if she misses Namenda  dose.  Continues on aspirin  325 mg daily and atorvastatin  for secondary stroke prevention.  Lipid panel obtained 1 year prior satisfactory.  No recent lab work.  Blood pressure today elevated at 163/63, asymptomatic.  Blood pressure not routinely monitored at home.  No concerns at this time.  Update 12/06/2018 JM: Ms. Hollie is a 78 year old female who is being seen  today for stroke follow-up accompanied by her husband.  Residual deficits include cognitive impairment.  Was initiated on Namenda  at prior visit.  She has continued on Namenda  10 mg daily tolerating well without side effects.  Memory has been overall stable.  Per husband, she did miss a couple doses of Namenda  and he did notice a great difference in her cognition.  He tries to encourage her to do memory exercises but she does not do them routinely.  He continues to provide 24/7 care and supervision due to cognitive impairment and safety concerns.  Continues on aspirin  325 mg daily and atorvastatin  80 mg daily for secondary stroke prevention without side effects.  Blood pressure today elevated initially and on recheck 150/80.  Monitor his levels at home which has been stable.  She was evaluated by Dr. Chalice on 03/08/2018 for possible sleep apnea and recommended undergoing sleep testing but has not done so at this time as office has attempted to call twice but unable to contact.  No further concerns at this time.  Update 01/18/2018 Dr. Rosemarie : She returns for follow-up after last visit 2 months ago.  She is accompanied by her husband.  She continues to have cognitive impairment and memory difficulties which are not improving.  She has very poor short-term memory.  She cannot be left alone.  She needs constant reminders and supervision.  She is unable to do things that she used to do in the past and husband needs to keep a close eye on her.  She did undergo lab work for reversible causes of cognitive impairment on 11/10/2017 and vitamin B12, TSH and RPR were all normal.  EEG done on 12/07/2017 was also normal.  CT angiogram of the brain and neck done on 11/19/2017 showed no significant for current intra-and extracranial stenosis and the previously seen right carotid thrombus had completely resolved.  Patient has poor appetite and has weight loss and hence would not be a good candidate for Aricept .  She has not tried  Namenda  yet.  She is tolerating aspirin  well without any bleeding or bruising.  She has stopped Coumadin  after his CT Angie results showed resolution of the clot.  Her blood pressure is well controlled and today it is 121/6 3.  The patient was having a dry cough and her primary physician Dr. Jakie plans to change lisinopril  to alternative blood pressure medicine.  She remains on Lipitor  which is tolerating well without any side effects.  She has not had any recurrent stroke or TIA symptoms.  Initial visit 11/10/2017 Dr. Rosemarie; Ms Kaysen Sefcik is a pleasant 78 year old Vietnamese [place] lady who is seen today for initial office consultation visit for stroke.  She is accompanied by her husband.  History is obtained from them and review of electronic medical records.  I have personally reviewed imaging films. HPI ( Dr Voncile ) MARLA Odis Forth Tenna Lacko is an 78 y.o. female with a PMH of HTN who presented to the ED with  a 2 day history of speech changes, AMS and left face weakness.Per patient family member these symptoms have been intermittent for weeks, but for the past 2 days the patient has had increasing confusion. She is doing odd things like attempting to make coffee w/o coffee grounds. She is becoming more easily agitated and argues about the time. 2 days ago he noticed that the left side of her face seemed different and that she was not speaking as clearly. Denies any ETOH, or drug abuse. Denies any CP or SOB. Due to the persistence of symptoms and then becoming more frequent as well as the facial droop, there has been decided to bring the patient to the emergency room for evaluation.Noncontrast CT of the head was done that showed a hypodensity suggestive of subacute infarct in the right basal ganglia/caudate.Neurological consultation was obtained for further evaluation. .LKW: 2-4 day ago; uncertain of time NIHSS: 3 MRI scan of the brain showed right frontal deep white matter infarct extending into the head of the  caudate with scattered peripheral cortical right frontal infarcts.  MRI of the brain showed a 5 mm distal right MCA aneurysm.  2D echo showed normal ejection fraction without cardiac source of embolus.  LDL cholesterol was elevated at 180 mg percent.  Hemoglobin A1c was 6.1.  CT angiogram of the head and neck showed right common carotid artery thrombus which was nonocclusive.  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  Patient was felt to have embolized distally from the right common carotid artery thrombus and hence was started on anticoagulation with warfarin and on Lipitor  for his elevated lipids.  Patient is currently living at home with her husband.  She has significant cognitive impairment with short-term memory being very poor.  She is also quite slow to process information and has trouble with multitasking.  She requires supervision for activities like cooking and working in the kitchen which she used to love.  She has left the burners on several times while cooking now.  She is tolerating warfarin well without bleeding or bruising and INR has been fairly stable.  Her facial droop and speech appear to have improved.  Patient's husband is wondering when her cognitive difficulties are going to improve and whether she will be able to become fully independent.  She has no prior history of cognitive impairment or memory difficulties.  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     ROS:   14 system review of systems is positive for memory loss, confusion and all other systems negative   PMH:  Past Medical History:  Diagnosis Date   Cerebrovascular accident (CVA) (HCC)    Hypertension    no medications for years    Social History:  Social History   Socioeconomic History   Marital status: Married    Spouse name: Not on file   Number of children: Not on file   Years of education: Not on file   Highest education level: Not on file   Occupational History   Occupation: retired  Tobacco Use   Smoking status: Never   Smokeless tobacco: Never  Vaping Use   Vaping status: Not on file  Substance and Sexual Activity   Alcohol  use: Never   Drug use: Never   Sexual activity: Not on file  Other Topics Concern   Not on file  Social History Narrative   Former Editor, 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  Lives alone, but has 5 children in  the area who are grown and supportive.  +Financial difficulties - trouble paying bills.  She has been married to her current husband for 3 years.   Social Drivers of Corporate Investment Banker Strain: Not on file  Food Insecurity: Not on file  Transportation Needs: Not on file  Physical Activity: Not on file  Stress: Not on file  Social Connections: Not on file  Intimate Partner Violence: Not on file    Medications:   Current Outpatient Medications on File Prior to Visit  Medication Sig Dispense Refill   apixaban  (ELIQUIS ) 5 MG TABS tablet Take 1 tablet (5 mg total) by mouth 2 (two) times daily. 60 tablet 1   atorvastatin  (LIPITOR ) 80 MG tablet Take 1 tablet (80 mg total) by mouth daily. 90 tablet 0   ezetimibe  (ZETIA ) 10 MG tablet Take 10 mg by mouth daily.     losartan  (COZAAR ) 100 MG tablet Take 1 tablet (100 mg total) by mouth daily. 30 tablet 1   memantine  (NAMENDA  XR) 28 MG CP24 24 hr capsule TAKE 1 CAPSULE BY MOUTH DAILY. 90 capsule 2   memantine  (NAMENDA ) 10 MG tablet TAKE 1 TABLET BY MOUTH TWICE A DAY 180 tablet 4   metoprolol  tartrate (LOPRESSOR ) 25 MG tablet Take 1 tablet (25 mg total) by mouth 2 (two) times daily. 30 tablet 0   Omega-3 Fatty Acids (FISH OIL) 1000 MG CPDR Take 2,000 mg by mouth daily.     No current facility-administered medications on file prior to visit.    Allergies:  No Known Allergies  Physical Exam  Today's Vitals   01/14/23 0735  BP: (!) 149/54  Pulse: (!) 44  Weight: 116 lb (52.6 kg)  Height: 4' 8 (1.422 m)    Body mass index is 26.01 kg/m.  General:  petite frail very pleasant Vietnamese lady, seated, in no evident distress Head: head normocephalic and atraumatic.   Neck: supple with no carotid or supraclavicular bruits Cardiovascular: regular rate and rhythm, no murmurs Musculoskeletal: no deformity Skin:  no rash/petichiae Vascular:  Normal pulses all extremities  Neurologic Exam Mental Status: Awake and fully alert. No evidence of aphasia or dysarthria.  Follows commands without great difficulty.  Mood and affect appropriate.  Husband provides majority of history.    01/14/2023    7:41 AM 07/01/2022    1:00 PM 12/10/2021    8:45 AM  MMSE - Mini Mental State Exam  Orientation to time 0 0 1  Orientation to Place 3 2 4   Registration 3 3 3   Attention/ Calculation 0 0 0  Recall 0 0 0  Language- name 2 objects 2 2 2   Language- repeat 0 1 1  Language- follow 3 step command 2 3 2   Language- follow 3 step command-comments   didnt fold paper  Language- read & follow direction 0 1 0  Language-read & follow direction-comments   read the sentance but didnt close eyes  Write a sentence 0 1 1  Copy design 0 0 0  Total score 10 13 14    Cranial Nerves: Pupils equal, briskly reactive to light. Extraocular movements full without nystagmus. Visual fields full to confrontation. Hearing intact. Facial sensation intact. Face, tongue, palate moves normally and symmetrically.  Motor: Normal bulk and tone. Normal strength in all tested extremity muscles.   Sensory.: intact to touch , pinprick , position and vibratory sensation.  Coordination: Rapid alternating movements normal in all extremities. Finger-to-nose and heel-to-shin performed accurately bilaterally. Gait and Station: [employer]  from chair without difficulty. Stance is normal. Gait demonstrates normal stride length and balance without use of assistive device Reflexes: 1+ and symmetric. Toes downgoing.        ASSESSMENT/PLAN: 78 year old Vietnamese lady with small stroke not seen on imaging  versus TIA s/p TNK on 08/03/2021 likely in setting of new onset A-fib and history of right MCA branch infarct in September 2019 secondary to thromboembolism from right carotid thrombus with significant residual vascular cognitive impairment.  Vascular risk factors of hypertension, carotid thrombus and hyperlipidemia.      L MCA stroke Hx of R MCA stroke :  Residual deficit: Cognitive impairment (see #2).  Continue  Eliquis  5 mg BID   and atorvastatin  80 mg daily and Zetia  for secondary stroke prevention.  Managed by PCP/cardiology. Will repeat BMP today, recent Cre 1.51 in setting of dehydration. Suspect this has improved as husband has been pushing water intake.  If remains >1.5, will notify cardiology as Eliquis  dose would need to be adjusted as weight is 52.6 kg Continue to follow with cardiology for atrial fibrillation and Eliquis  management Discussed secondary stroke prevention measures and importance of close PCP f/u for aggressive stroke risk factor management including HTN with BP goal<130/90 and HLD with LDL goal<70  Vascular dementia w/o behaviors:  Continued gradual decline MMSE today 10/30 (prior 13/30) continue Namenda  XR 28 mg daily Start Aricept  5 mg nightly, consider increasing at follow-up visit Highly encourage continued memory exercises such as puzzles, reading and card games as well as routine exercise, socialization, healthy diet and adequate sleep as well as importance of managing stroke risk factors    Follow-up in 6 months or call earlier if needed   CC:  Yolande Toribio MATSU, MD    I spent 30 minutes of face-to-face and non-face-to-face time with patient and husband.  This included previsit chart review including review of recent hospitalization, lab review, study review, order entry, electronic health record documentation, patient and husband education and discussion regarding above diagnoses and treatment plan and answered all the questions to patient and husband  satisfaction  Harlene Bogaert, AGNP-BC  [facility] [address] [place], [place] [address]  Phone [phone] Fax [phone] Note: This document was prepared with digital dictation and possible smart phrase technology. Any transcriptional errors that result from this process are unintentional.

## 2023-01-14 ENCOUNTER — Encounter: Payer: Self-pay | Admitting: Adult Health

## 2023-01-14 ENCOUNTER — Ambulatory Visit: Payer: Medicare HMO | Admitting: Adult Health

## 2023-01-14 VITALS — BP 149/54 | HR 44 | Ht <= 58 in | Wt 116.0 lb

## 2023-01-14 DIAGNOSIS — Z8673 Personal history of transient ischemic attack (TIA), and cerebral infarction without residual deficits: Secondary | ICD-10-CM

## 2023-01-14 DIAGNOSIS — F0150 Vascular dementia without behavioral disturbance: Secondary | ICD-10-CM

## 2023-01-14 DIAGNOSIS — R7989 Other specified abnormal findings of blood chemistry: Secondary | ICD-10-CM | POA: Diagnosis not present

## 2023-01-14 MED ORDER — DONEPEZIL HCL 5 MG PO TABS: 5.0000 mg | ORAL_TABLET | Freq: Every day | ORAL | 11 refills | Status: DC

## 2023-01-14 NOTE — Patient Instructions (Addendum)
 Continue Namenda  XR 28mg  daily  Start Aricept  5 mg nightly  Continue  Eliquis  5 mg twice daily , Zetia  10 mg daily and atorvastatin  80 mg daily for secondary stroke prevention  We will check lab work to recheck kidney function   Continue to follow up with PCP regarding blood pressure and cholesterol management  Maintain strict control of hypertension with blood pressure goal below 130/90 and cholesterol with LDL cholesterol (bad cholesterol) goal below 70 mg/dL.   Signs of a Stroke? Follow the BEFAST method:  Balance Watch for a sudden loss of balance, trouble with coordination or vertigo Eyes Is there a sudden loss of vision in one or both eyes? Or double vision?  Face: Ask the person to smile. Does one side of the face droop or is it numb?  Arms: Ask the person to raise both arms. Does one arm drift downward? Is there weakness or numbness of a leg? Speech: Ask the person to repeat a simple phrase. Does the speech sound slurred/strange? Is the person confused ? Time: If you observe any of these signs, call 911.      Followup in the future with me in 6 months or call earlier if needed       Thank you for coming to see us  at [facility]. I hope we have been able to provide you high quality care today.  You may receive a patient satisfaction survey over the next few weeks. We would appreciate your feedback and comments so that we may continue to improve ourselves and the health of our patients.

## 2023-01-15 LAB — BASIC METABOLIC PANEL
BUN/Creatinine Ratio: 15 (ref 12–28)
BUN: 15 mg/dL (ref 8–27)
CO2: 25 mmol/L (ref 20–29)
Calcium: 9.3 mg/dL (ref 8.7–10.3)
Chloride: 103 mmol/L (ref 96–106)
Creatinine, Ser: 1.03 mg/dL — ABNORMAL HIGH (ref 0.57–1.00)
Glucose: 118 mg/dL — ABNORMAL HIGH (ref 70–99)
Potassium: 4.8 mmol/L (ref 3.5–5.2)
Sodium: 142 mmol/L (ref 134–144)
eGFR: 57 mL/min/{1.73_m2} — ABNORMAL LOW (ref >59)

## 2023-01-28 DIAGNOSIS — R7309 Other abnormal glucose: Secondary | ICD-10-CM | POA: Diagnosis not present

## 2023-01-28 DIAGNOSIS — Z1212 Encounter for screening for malignant neoplasm of rectum: Secondary | ICD-10-CM | POA: Diagnosis not present

## 2023-01-28 DIAGNOSIS — E785 Hyperlipidemia, unspecified: Secondary | ICD-10-CM | POA: Diagnosis not present

## 2023-01-28 DIAGNOSIS — I10 Essential (primary) hypertension: Secondary | ICD-10-CM | POA: Diagnosis not present

## 2023-02-04 DIAGNOSIS — E785 Hyperlipidemia, unspecified: Secondary | ICD-10-CM | POA: Diagnosis not present

## 2023-02-04 DIAGNOSIS — G4700 Insomnia, unspecified: Secondary | ICD-10-CM | POA: Diagnosis not present

## 2023-02-04 DIAGNOSIS — I480 Paroxysmal atrial fibrillation: Secondary | ICD-10-CM | POA: Diagnosis not present

## 2023-02-04 DIAGNOSIS — Z0000 Encounter for general adult medical examination without abnormal findings: Secondary | ICD-10-CM | POA: Diagnosis not present

## 2023-02-04 DIAGNOSIS — R718 Other abnormality of red blood cells: Secondary | ICD-10-CM | POA: Diagnosis not present

## 2023-02-04 DIAGNOSIS — I10 Essential (primary) hypertension: Secondary | ICD-10-CM | POA: Diagnosis not present

## 2023-02-04 DIAGNOSIS — Z7901 Long term (current) use of anticoagulants: Secondary | ICD-10-CM | POA: Diagnosis not present

## 2023-02-04 DIAGNOSIS — F01B0 Vascular dementia, moderate, without behavioral disturbance, psychotic disturbance, mood disturbance, and anxiety: Secondary | ICD-10-CM | POA: Diagnosis not present

## 2023-03-08 ENCOUNTER — Other Ambulatory Visit: Payer: Self-pay | Admitting: Neurology

## 2023-03-08 NOTE — Telephone Encounter (Signed)
 Last seen on 01/14/23 Follow up scheduled on 07/14/23

## 2023-03-23 ENCOUNTER — Encounter (HOSPITAL_BASED_OUTPATIENT_CLINIC_OR_DEPARTMENT_OTHER): Payer: Self-pay | Admitting: Emergency Medicine

## 2023-03-23 ENCOUNTER — Emergency Department (HOSPITAL_BASED_OUTPATIENT_CLINIC_OR_DEPARTMENT_OTHER)

## 2023-03-23 ENCOUNTER — Emergency Department (HOSPITAL_BASED_OUTPATIENT_CLINIC_OR_DEPARTMENT_OTHER)
Admission: EM | Admit: 2023-03-23 | Discharge: 2023-03-23 | Disposition: A | Discharge status: Home / Self Care | Attending: Emergency Medicine | Admitting: Emergency Medicine

## 2023-03-23 ENCOUNTER — Other Ambulatory Visit: Payer: Self-pay

## 2023-03-23 DIAGNOSIS — N3000 Acute cystitis without hematuria: Secondary | ICD-10-CM | POA: Diagnosis not present

## 2023-03-23 DIAGNOSIS — U071 COVID-19: Secondary | ICD-10-CM | POA: Insufficient documentation

## 2023-03-23 DIAGNOSIS — Z79899 Other long term (current) drug therapy: Secondary | ICD-10-CM | POA: Insufficient documentation

## 2023-03-23 DIAGNOSIS — I6782 Cerebral ischemia: Secondary | ICD-10-CM | POA: Diagnosis not present

## 2023-03-23 DIAGNOSIS — R4182 Altered mental status, unspecified: Secondary | ICD-10-CM | POA: Insufficient documentation

## 2023-03-23 DIAGNOSIS — I10 Essential (primary) hypertension: Secondary | ICD-10-CM | POA: Insufficient documentation

## 2023-03-23 DIAGNOSIS — Z7901 Long term (current) use of anticoagulants: Secondary | ICD-10-CM | POA: Diagnosis not present

## 2023-03-23 DIAGNOSIS — R0689 Other abnormalities of breathing: Secondary | ICD-10-CM | POA: Insufficient documentation

## 2023-03-23 DIAGNOSIS — R509 Fever, unspecified: Secondary | ICD-10-CM

## 2023-03-23 DIAGNOSIS — G9389 Other specified disorders of brain: Secondary | ICD-10-CM | POA: Diagnosis not present

## 2023-03-23 DIAGNOSIS — R059 Cough, unspecified: Secondary | ICD-10-CM | POA: Diagnosis not present

## 2023-03-23 HISTORY — DX: Unspecified atrial fibrillation: I48.91

## 2023-03-23 LAB — COMPREHENSIVE METABOLIC PANEL
ALT: 31 U/L (ref 0–44)
AST: 25 U/L (ref 15–41)
Albumin: 4.1 g/dL (ref 3.5–5.0)
Alkaline Phosphatase: 78 U/L (ref 38–126)
Anion gap: 7 (ref 5–15)
BUN: 19 mg/dL (ref 8–23)
CO2: 28 mmol/L (ref 22–32)
Calcium: 8.8 mg/dL — ABNORMAL LOW (ref 8.9–10.3)
Chloride: 106 mmol/L (ref 98–111)
Creatinine, Ser: 1.17 mg/dL — ABNORMAL HIGH (ref 0.44–1.00)
GFR, Estimated: 49 mL/min — ABNORMAL LOW (ref >60)
Glucose, Bld: 135 mg/dL — ABNORMAL HIGH (ref 70–99)
Potassium: 4.2 mmol/L (ref 3.5–5.1)
Sodium: 141 mmol/L (ref 135–145)
Total Bilirubin: 0.7 mg/dL (ref 0.0–1.2)
Total Protein: 7.6 g/dL (ref 6.5–8.1)

## 2023-03-23 LAB — CBC WITH DIFFERENTIAL/PLATELET
Abs Immature Granulocytes: 0.02 10*3/uL (ref 0.00–0.07)
Basophils Absolute: 0 10*3/uL (ref 0.0–0.1)
Basophils Relative: 0 %
Eosinophils Absolute: 0 10*3/uL (ref 0.0–0.5)
Eosinophils Relative: 0 %
HCT: 38.6 % (ref 36.0–46.0)
Hemoglobin: 11.5 g/dL — ABNORMAL LOW (ref 12.0–15.0)
Immature Granulocytes: 0 %
Lymphocytes Relative: 6 %
Lymphs Abs: 0.4 10*3/uL — ABNORMAL LOW (ref 0.7–4.0)
MCH: 22.8 pg — ABNORMAL LOW (ref 26.0–34.0)
MCHC: 29.8 g/dL — ABNORMAL LOW (ref 30.0–36.0)
MCV: 76.4 fL — ABNORMAL LOW (ref 80.0–100.0)
Monocytes Absolute: 0.6 10*3/uL (ref 0.1–1.0)
Monocytes Relative: 9 %
Neutro Abs: 6.2 10*3/uL (ref 1.7–7.7)
Neutrophils Relative %: 85 %
Platelets: 196 10*3/uL (ref 150–400)
RBC: 5.05 MIL/uL (ref 3.87–5.11)
RDW: 14.4 % (ref 11.5–15.5)
WBC: 7.3 10*3/uL (ref 4.0–10.5)
nRBC: 0 % (ref 0.0–0.2)

## 2023-03-23 LAB — LACTIC ACID, PLASMA: Lactic Acid, Venous: 1.3 mmol/L (ref 0.5–1.9)

## 2023-03-23 LAB — BRAIN NATRIURETIC PEPTIDE: B Natriuretic Peptide: 116.8 pg/mL — ABNORMAL HIGH (ref 0.0–100.0)

## 2023-03-23 LAB — URINALYSIS, ROUTINE W REFLEX MICROSCOPIC
Bilirubin Urine: NEGATIVE
Glucose, UA: NEGATIVE mg/dL
Hgb urine dipstick: NEGATIVE
Ketones, ur: NEGATIVE mg/dL
Nitrite: NEGATIVE
Specific Gravity, Urine: 1.022 (ref 1.005–1.030)
pH: 5 (ref 5.0–8.0)

## 2023-03-23 LAB — RESP PANEL BY RT-PCR (RSV, FLU A&B, COVID)  RVPGX2
Influenza A by PCR: NEGATIVE
Influenza B by PCR: NEGATIVE
Resp Syncytial Virus by PCR: NEGATIVE
SARS Coronavirus 2 by RT PCR: POSITIVE — AB

## 2023-03-23 LAB — LIPASE, BLOOD: Lipase: 21 U/L (ref 11–51)

## 2023-03-23 LAB — CBG MONITORING, ED: Glucose-Capillary: 154 mg/dL — ABNORMAL HIGH (ref 70–99)

## 2023-03-23 MED ORDER — ACETAMINOPHEN 325 MG PO TABS
650.0000 mg | ORAL_TABLET | Freq: Once | ORAL | Status: AC
  Administered 2023-03-23: 650 mg via ORAL
  Filled 2023-03-23: qty 2

## 2023-03-23 MED ORDER — SODIUM CHLORIDE 0.9 % IV SOLN
1.0000 g | Freq: Once | INTRAVENOUS | Status: AC
  Administered 2023-03-23: 1 g via INTRAVENOUS
  Filled 2023-03-23: qty 10

## 2023-03-23 MED ORDER — SODIUM CHLORIDE 0.9 % IV BOLUS
500.0000 mL | Freq: Once | INTRAVENOUS | Status: AC
  Administered 2023-03-23: 500 mL via INTRAVENOUS

## 2023-03-23 MED ORDER — CEPHALEXIN 500 MG PO CAPS: 500.0000 mg | ORAL_CAPSULE | Freq: Two times a day (BID) | ORAL | 0 refills | Status: DC

## 2023-03-23 NOTE — ED Provider Notes (Signed)
 [place] EMERGENCY DEPARTMENT AT [facility] Provider Note   CSN: [id number] Arrival date & time: 03/23/23  1314     History {Add pertinent medical, surgical, social history, OB history to HPI:1} Chief Complaint  Patient presents with  . Altered Mental Status    Sherry Fitzgerald is a 78 y.o. female.  Patient brought in because of increased confusion this morning and walking offkilter.  Patient did also develop a cough today.  Patient's temp is 100.7 pulse is 68 respirations 14 blood pressure 159/64 oxygen saturation 100%.  Patient's past medical history sniffer hypertension and CVA.  Patient is [facility] but does speak [place].        Home Medications Prior to Admission medications   Medication Sig Start Date End Date Taking? Authorizing Provider  apixaban (ELIQUIS) 5 MG TABS tablet Take 1 tablet (5 mg total) by mouth 2 (two) times daily. 08/05/21   de Saintclair Halsted, Cortney E, NP  atorvastatin (LIPITOR) 80 MG tablet Take 1 tablet (80 mg total) by mouth daily. 11/08/17   Myrene Buddy, MD  donepezil (ARICEPT) 5 MG tablet Take 1 tablet (5 mg total) by mouth at bedtime. 01/14/23   Ihor Austin, NP  ezetimibe (ZETIA) 10 MG tablet Take 10 mg by mouth daily. 06/09/21   [provider]  losartan (COZAAR) 100 MG tablet Take 1 tablet (100 mg total) by mouth daily. 08/06/21   de Saintclair Halsted, Cortney E, NP  memantine (NAMENDA XR) 28 MG CP24 24 hr capsule TAKE 1 CAPSULE BY MOUTH EVERY DAY 03/08/23   Micki Riley, MD  metoprolol tartrate (LOPRESSOR) 25 MG tablet Take 1 tablet (25 mg total) by mouth 2 (two) times daily. 08/05/21   de Saintclair Halsted, Cortney E, NP  Omega-3 Fatty Acids (FISH OIL) 1000 MG CPDR Take 2,000 mg by mouth daily.    [provider]      Allergies    Patient has no known allergies.    Review of Systems   Review of Systems  Unable to perform ROS: Mental status change    Physical Exam Updated Vital Signs BP (!) 159/64 (BP Location: Left Arm)   Pulse  68   Temp (!) 100.7 F (38.2 C) (Oral)   Resp 14   Ht 1.422 m (4\' 8" )   Wt 52.6 kg   SpO2 100%   BMI 26.00 kg/m  Physical Exam Vitals and nursing note reviewed.  Constitutional:      General: She is not in acute distress.    Appearance: Normal appearance. She is well-developed. She is not ill-appearing.  HENT:     Head: Normocephalic and atraumatic.     Mouth/Throat:     Mouth: Mucous membranes are moist.  Eyes:     Extraocular Movements: Extraocular movements intact.     Conjunctiva/sclera: Conjunctivae normal.     Pupils: Pupils are equal, round, and reactive to light.  Cardiovascular:     Rate and Rhythm: Normal rate and regular rhythm.     Heart sounds: No murmur heard. Pulmonary:     Effort: Pulmonary effort is normal. No respiratory distress.     Breath sounds: Normal breath sounds.  Abdominal:     Palpations: Abdomen is soft.     Tenderness: There is no abdominal tenderness.  Musculoskeletal:        General: No swelling.     Cervical back: Normal range of motion and neck supple. No rigidity.     Right lower leg: No  edema.     Left lower leg: No edema.  Skin:    General: Skin is warm and dry.     Capillary Refill: Capillary refill takes less than 2 seconds.  Neurological:     General: No focal deficit present.     Mental Status: She is alert.     Comments: Patient awake will follow commands.  No obvious focal neurodeficit.  Psychiatric:        Mood and Affect: Mood normal.    ED Results / Procedures / Treatments   Labs (all labs ordered are listed, but only abnormal results are displayed) Labs Reviewed  CBG MONITORING, ED - Abnormal; Notable for the following components:      Result Value   Glucose-Capillary 154 (*)    All other components within normal limits    EKG None  Radiology No results found.  Procedures Procedures  {Document cardiac monitor, telemetry assessment procedure when appropriate:1}  Medications Ordered in ED Medications - No  data to display  ED Course/ Medical Decision Making/ A&P   {   Click here for ABCD2, HEART and other calculatorsREFRESH Note before signing :1}                              Medical Decision Making Amount and/or Complexity of Data Reviewed Labs: ordered. Radiology: ordered.   Patient with fever here 100.7.  Will do workup to include blood cultures lactic acid although does not seem to meet sepsis criteria on initial vital signs.  Will get chest x-ray will get CT head will get CBC complete metabolic panel urinalysis.  Respiratory panel because of the cough. Final Clinical Impression(s) / ED Diagnoses Final diagnoses:  None    Rx / DC Orders ED Discharge Orders     None

## 2023-03-23 NOTE — ED Notes (Signed)
 RBVO cancel 2nd lactic acid per Dr. Fredderick Phenix

## 2023-03-23 NOTE — ED Notes (Signed)
 Called lab to add urine culture to UA.

## 2023-03-23 NOTE — ED Notes (Addendum)
 Patient began having elevated HR, A-fib RVR @ 133 on cardiac monitor. New EKG performed and shown to Dr. Fredderick Phenix. New orders for NS bolus. Bolus started as Rocephin completed. Husband at bedside.

## 2023-03-23 NOTE — ED Notes (Signed)
 Patient currently in SR @ 72.

## 2023-03-23 NOTE — ED Triage Notes (Signed)
 Husband states patient was "staggering around the house". Hx of dementia. Husband states LNK was 0800 this morning. No deficits and able to follow commands.

## 2023-03-23 NOTE — ED Provider Notes (Signed)
 Care was taken over from Dr. Deretha Emory.  Patient presented with cough, fever and generalized weakness.  She has a history of dementia but reported was a little more confused today.  Her husband is at bedside and helps provide history.  She is COVID-positive which likely explains the majority of her symptoms.  Her urine was concerning for a UTI.  It was sent for culture.  She was given a dose of IV Rocephin.  Her other blood work was nonconcerning.  Her lactic was negative.  She is maintaining normal oxygen saturations on room air.  Chest x-ray does not show any evidence of pneumonia.  Head CT does not show any acute abnormality.  She is awake and alert.  Husband says she is little weaker than normal but has been able to ambulate around the house and does not feel like she needs to be admitted to the hospital.  I did offer admission but patient and the husband would rather have her go home.  I did advise to watch out for any worsening symptoms and return for any change in her status.  She was discharged home in good condition.  She was given a prescription for Keflex.  She did have an incident where she went into A-fib with RVR but it self converted and was brief.  She has not had any further episodes.  She remains currently in a sinus rhythm.   Rolan Bucco, MD 03/23/23 [phone]

## 2023-03-24 LAB — URINE CULTURE: Culture: NO GROWTH

## 2023-03-26 ENCOUNTER — Other Ambulatory Visit: Payer: Self-pay

## 2023-03-26 ENCOUNTER — Telehealth (HOSPITAL_BASED_OUTPATIENT_CLINIC_OR_DEPARTMENT_OTHER): Payer: Self-pay | Admitting: Emergency Medicine

## 2023-03-26 ENCOUNTER — Encounter (HOSPITAL_BASED_OUTPATIENT_CLINIC_OR_DEPARTMENT_OTHER): Payer: Self-pay | Admitting: Emergency Medicine

## 2023-03-26 ENCOUNTER — Other Ambulatory Visit: Payer: Self-pay | Admitting: Neurology

## 2023-03-26 ENCOUNTER — Emergency Department (HOSPITAL_BASED_OUTPATIENT_CLINIC_OR_DEPARTMENT_OTHER)
Admission: EM | Admit: 2023-03-26 | Discharge: 2023-03-26 | Disposition: A | Discharge status: Home / Self Care | Attending: Emergency Medicine | Admitting: Emergency Medicine

## 2023-03-26 DIAGNOSIS — Z7901 Long term (current) use of anticoagulants: Secondary | ICD-10-CM | POA: Insufficient documentation

## 2023-03-26 DIAGNOSIS — R799 Abnormal finding of blood chemistry, unspecified: Secondary | ICD-10-CM | POA: Diagnosis not present

## 2023-03-26 DIAGNOSIS — R899 Unspecified abnormal finding in specimens from other organs, systems and tissues: Secondary | ICD-10-CM

## 2023-03-26 LAB — BLOOD CULTURE ID PANEL (REFLEXED) - BCID2

## 2023-03-26 NOTE — ED Provider Notes (Signed)
 [place] EMERGENCY DEPARTMENT AT [facility] Provider Note   CSN: [id number] Arrival date & time: 03/26/23  1629     History  Chief Complaint  Patient presents with   Abnormal Labs    Sherry Fitzgerald is a 78 y.o. female.  Patient BIB husband at the request of ED provider after review of blood cultures obtained on 3/18. She was seen at that time for symptoms of fever, cough, confusion and was ultimately diagnosed with COVID and a UTI. Urine culture review today show negative results. Review of blood cultures: one showing no growth, one showing gram positive cocci. She is asked to present for re-evaluation. Per husband, previous symptoms are all improving. No further fever. She has dementia but is at baseline mental status. She is eating and drinking and has returned to her normal functioning.   The history is provided by the spouse. No language interpreter was used.       Home Medications Prior to Admission medications   Medication Sig Start Date End Date Taking? Authorizing Provider  apixaban (ELIQUIS) 5 MG TABS tablet Take 1 tablet (5 mg total) by mouth 2 (two) times daily. 08/05/21   de Saintclair Halsted, Cortney E, NP  atorvastatin (LIPITOR) 80 MG tablet Take 1 tablet (80 mg total) by mouth daily. 11/08/17   Myrene Buddy, MD  cephALEXin (KEFLEX) 500 MG capsule Take 1 capsule (500 mg total) by mouth 2 (two) times daily. 03/23/23   Rolan Bucco, MD  donepezil (ARICEPT) 5 MG tablet Take 1 tablet (5 mg total) by mouth at bedtime. 01/14/23   Ihor Austin, NP  ezetimibe (ZETIA) 10 MG tablet Take 10 mg by mouth daily. 06/09/21   [provider]  losartan (COZAAR) 100 MG tablet Take 1 tablet (100 mg total) by mouth daily. 08/06/21   de Saintclair Halsted, Cortney E, NP  memantine (NAMENDA XR) 28 MG CP24 24 hr capsule TAKE 1 CAPSULE BY MOUTH EVERY DAY 03/08/23   Micki Riley, MD  metoprolol tartrate (LOPRESSOR) 25 MG tablet Take 1 tablet (25 mg total) by mouth 2 (two) times daily. 08/05/21    de Saintclair Halsted, Lennox Solders, NP      Allergies    Patient has no known allergies.    Review of Systems   Review of Systems  Physical Exam Updated Vital Signs BP (!) 153/85 (BP Location: Right Arm)   Pulse 67   Temp 98.1 F (36.7 C)   Resp 18   Wt 53 kg   SpO2 97%   BMI 26.20 kg/m  Physical Exam Constitutional:      General: She is not in acute distress.    Appearance: Normal appearance. She is well-developed. She is not ill-appearing.  HENT:     Mouth/Throat:     Mouth: Mucous membranes are moist.  Cardiovascular:     Rate and Rhythm: Normal rate.  Pulmonary:     Effort: Pulmonary effort is normal.  Musculoskeletal:        General: Normal range of motion.     Cervical back: Normal range of motion.  Skin:    General: Skin is warm and dry.  Neurological:     Mental Status: She is alert.     ED Results / Procedures / Treatments   Labs (all labs ordered are listed, but only abnormal results are displayed) Labs Reviewed - No data to display  EKG None  Radiology No results found.  Procedures Procedures    Medications Ordered in  ED Medications - No data to display  ED Course/ Medical Decision Making/ A&P Clinical Course as of 03/26/23 1942  Caleen Essex Mar 26, 2023  1027 Patient here to review blood cultures obtained  3/18 as detailed in the HPI.   Culture 1 - no growth Culture 2 - Gram + cocci Blood culture ID panel - no positive findings.  The patient is improving, per husband. Has had no fever. Positive culture felt to be likely contamination based on patient's clinical presentation today. Can be discharged. Follow up with PCP prn.  [SU]    Clinical Course User Index [SU] Elpidio Anis, PA-C                                 Medical Decision Making          Final Clinical Impression(s) / ED Diagnoses Final diagnoses:  Abnormal laboratory test    Rx / DC Orders ED Discharge Orders     None         Danne Harbor 03/26/23 1942     Anders Simmonds T, DO 03/26/23 2317

## 2023-03-26 NOTE — Discharge Instructions (Signed)
 You can be discharged home. The blood culture that was positive is felt to be a false positive but re-evaluation was necessary to determine this. Thank you for coming to the ED today despite recovering from COVID.   Please return to the ED or see your primary care provider with any new symptoms of concern.

## 2023-03-26 NOTE — Telephone Encounter (Signed)
 I was given report of patient's blood cultures which showed gram-positive cocci in clusters in the anaerobic bottle.  Given that the patient was here for fever and altered mental status, I advised her charge nurse to call this patient, have them return to an emergency room for reevaluation.

## 2023-03-26 NOTE — ED Triage Notes (Signed)
 Patient here on 3/18 for fever and AMS, diagnosed with covid then.  Patient called to come back to ER for positive blood cultures.  Patient's caregiver reports patient is "doing great" today.  Denies fevers, weakness, AMS today.

## 2023-03-28 LAB — CULTURE, BLOOD (ROUTINE X 2)
Culture: NO GROWTH
Special Requests: ADEQUATE

## 2023-04-20 ENCOUNTER — Other Ambulatory Visit: Payer: Self-pay | Admitting: Neurology

## 2023-05-28 ENCOUNTER — Other Ambulatory Visit: Payer: Self-pay | Admitting: Neurology

## 2023-07-14 ENCOUNTER — Ambulatory Visit: Payer: Medicare HMO | Admitting: Adult Health

## 2023-08-03 ENCOUNTER — Emergency Department (HOSPITAL_COMMUNITY)

## 2023-08-03 ENCOUNTER — Inpatient Hospital Stay (HOSPITAL_COMMUNITY)
Admission: EM | Admit: 2023-08-03 | Discharge: 2023-08-05 | DRG: 309 | Disposition: A | Discharge status: Home / Self Care | Attending: Internal Medicine | Admitting: Internal Medicine

## 2023-08-03 ENCOUNTER — Inpatient Hospital Stay (HOSPITAL_COMMUNITY)

## 2023-08-03 ENCOUNTER — Other Ambulatory Visit: Payer: Self-pay

## 2023-08-03 ENCOUNTER — Encounter (HOSPITAL_COMMUNITY): Payer: Self-pay

## 2023-08-03 DIAGNOSIS — F0390 Unspecified dementia without behavioral disturbance: Secondary | ICD-10-CM | POA: Diagnosis present

## 2023-08-03 DIAGNOSIS — Z5986 Financial insecurity: Secondary | ICD-10-CM

## 2023-08-03 DIAGNOSIS — N183 Chronic kidney disease, stage 3 unspecified: Secondary | ICD-10-CM | POA: Diagnosis present

## 2023-08-03 DIAGNOSIS — N179 Acute kidney failure, unspecified: Secondary | ICD-10-CM | POA: Diagnosis not present

## 2023-08-03 DIAGNOSIS — S0990XA Unspecified injury of head, initial encounter: Secondary | ICD-10-CM | POA: Diagnosis not present

## 2023-08-03 DIAGNOSIS — E875 Hyperkalemia: Secondary | ICD-10-CM | POA: Diagnosis present

## 2023-08-03 DIAGNOSIS — E782 Mixed hyperlipidemia: Secondary | ICD-10-CM | POA: Diagnosis not present

## 2023-08-03 DIAGNOSIS — Z8249 Family history of ischemic heart disease and other diseases of the circulatory system: Secondary | ICD-10-CM | POA: Diagnosis not present

## 2023-08-03 DIAGNOSIS — I129 Hypertensive chronic kidney disease with stage 1 through stage 4 chronic kidney disease, or unspecified chronic kidney disease: Secondary | ICD-10-CM | POA: Diagnosis present

## 2023-08-03 DIAGNOSIS — D649 Anemia, unspecified: Secondary | ICD-10-CM | POA: Diagnosis present

## 2023-08-03 DIAGNOSIS — I6782 Cerebral ischemia: Secondary | ICD-10-CM | POA: Diagnosis not present

## 2023-08-03 DIAGNOSIS — I480 Paroxysmal atrial fibrillation: Secondary | ICD-10-CM | POA: Diagnosis present

## 2023-08-03 DIAGNOSIS — Z7901 Long term (current) use of anticoagulants: Secondary | ICD-10-CM

## 2023-08-03 DIAGNOSIS — Z823 Family history of stroke: Secondary | ICD-10-CM

## 2023-08-03 DIAGNOSIS — R001 Bradycardia, unspecified: Principal | ICD-10-CM | POA: Diagnosis present

## 2023-08-03 DIAGNOSIS — I959 Hypotension, unspecified: Secondary | ICD-10-CM | POA: Diagnosis not present

## 2023-08-03 DIAGNOSIS — Z79899 Other long term (current) drug therapy: Secondary | ICD-10-CM

## 2023-08-03 DIAGNOSIS — G319 Degenerative disease of nervous system, unspecified: Secondary | ICD-10-CM | POA: Diagnosis not present

## 2023-08-03 DIAGNOSIS — R55 Syncope and collapse: Principal | ICD-10-CM | POA: Diagnosis present

## 2023-08-03 DIAGNOSIS — I447 Left bundle-branch block, unspecified: Secondary | ICD-10-CM | POA: Diagnosis present

## 2023-08-03 DIAGNOSIS — Z8673 Personal history of transient ischemic attack (TIA), and cerebral infarction without residual deficits: Secondary | ICD-10-CM

## 2023-08-03 DIAGNOSIS — I6501 Occlusion and stenosis of right vertebral artery: Secondary | ICD-10-CM | POA: Diagnosis not present

## 2023-08-03 DIAGNOSIS — Z9049 Acquired absence of other specified parts of digestive tract: Secondary | ICD-10-CM

## 2023-08-03 LAB — CBC WITH DIFFERENTIAL/PLATELET
Abs Immature Granulocytes: 0.05 K/uL (ref 0.00–0.07)
Basophils Absolute: 0 K/uL (ref 0.0–0.1)
Basophils Relative: 0 %
Eosinophils Absolute: 0 K/uL (ref 0.0–0.5)
Eosinophils Relative: 0 %
HCT: 40.4 % (ref 36.0–46.0)
Hemoglobin: 11.9 g/dL — ABNORMAL LOW (ref 12.0–15.0)
Immature Granulocytes: 1 %
Lymphocytes Relative: 12 %
Lymphs Abs: 1.3 K/uL (ref 0.7–4.0)
MCH: 23 pg — ABNORMAL LOW (ref 26.0–34.0)
MCHC: 29.5 g/dL — ABNORMAL LOW (ref 30.0–36.0)
MCV: 78.1 fL — ABNORMAL LOW (ref 80.0–100.0)
Monocytes Absolute: 0.7 K/uL (ref 0.1–1.0)
Monocytes Relative: 6 %
Neutro Abs: 8.9 K/uL — ABNORMAL HIGH (ref 1.7–7.7)
Neutrophils Relative %: 81 %
Platelets: 181 K/uL (ref 150–400)
RBC: 5.17 MIL/uL — ABNORMAL HIGH (ref 3.87–5.11)
RDW: 14.6 % (ref 11.5–15.5)
WBC: 10.9 K/uL — ABNORMAL HIGH (ref 4.0–10.5)
nRBC: 0 % (ref 0.0–0.2)

## 2023-08-03 LAB — COMPREHENSIVE METABOLIC PANEL WITH GFR
ALT: 29 U/L (ref 0–44)
AST: 27 U/L (ref 15–41)
Albumin: 3.6 g/dL (ref 3.5–5.0)
Alkaline Phosphatase: 81 U/L (ref 38–126)
Anion gap: 6 (ref 5–15)
BUN: 18 mg/dL (ref 8–23)
CO2: 27 mmol/L (ref 22–32)
Calcium: 8.9 mg/dL (ref 8.9–10.3)
Chloride: 107 mmol/L (ref 98–111)
Creatinine, Ser: 1.47 mg/dL — ABNORMAL HIGH (ref 0.44–1.00)
GFR, Estimated: 37 mL/min — ABNORMAL LOW (ref >60)
Glucose, Bld: 120 mg/dL — ABNORMAL HIGH (ref 70–99)
Potassium: 5.5 mmol/L — ABNORMAL HIGH (ref 3.5–5.1)
Sodium: 140 mmol/L (ref 135–145)
Total Bilirubin: 0.9 mg/dL (ref 0.0–1.2)
Total Protein: 7.2 g/dL (ref 6.5–8.1)

## 2023-08-03 LAB — PROTIME-INR
INR: 1.4 — ABNORMAL HIGH (ref 0.8–1.2)
Prothrombin Time: 18.3 s — ABNORMAL HIGH (ref 11.4–15.2)

## 2023-08-03 MED ORDER — DONEPEZIL HCL 5 MG PO TABS
5.0000 mg | ORAL_TABLET | Freq: Every day | ORAL | Status: DC
  Administered 2023-08-03: 5 mg via ORAL
  Filled 2023-08-03: qty 1

## 2023-08-03 MED ORDER — ONDANSETRON HCL 4 MG/2ML IJ SOLN
4.0000 mg | Freq: Once | INTRAMUSCULAR | Status: AC
  Administered 2023-08-03: 4 mg via INTRAVENOUS

## 2023-08-03 MED ORDER — ATROPINE SULFATE 0.4 MG/ML IV SOLN
0.5000 mg | Freq: Once | INTRAVENOUS | Status: AC
  Administered 2023-08-03: 0.5 mg via INTRAVENOUS
  Filled 2023-08-03: qty 1.25

## 2023-08-03 MED ORDER — MEMANTINE HCL ER 28 MG PO CP24
28.0000 mg | ORAL_CAPSULE | Freq: Every day | ORAL | Status: DC
  Administered 2023-08-03 – 2023-08-05 (×3): 28 mg via ORAL
  Filled 2023-08-03 (×3): qty 1

## 2023-08-03 MED ORDER — GLUCAGON HCL RDNA (DIAGNOSTIC) 1 MG IJ SOLR: 2.0000 mg | Freq: Once | INTRAMUSCULAR | Status: DC

## 2023-08-03 MED ORDER — SODIUM CHLORIDE 0.9 % IV BOLUS
500.0000 mL | Freq: Once | INTRAVENOUS | Status: AC
  Administered 2023-08-03: 500 mL via INTRAVENOUS

## 2023-08-03 MED ORDER — ACETAMINOPHEN 325 MG PO TABS
650.0000 mg | ORAL_TABLET | Freq: Four times a day (QID) | ORAL | Status: DC | PRN
  Administered 2023-08-03: 650 mg via ORAL
  Filled 2023-08-03: qty 2

## 2023-08-03 MED ORDER — LACTATED RINGERS IV SOLN: Freq: Once | INTRAVENOUS | Status: AC

## 2023-08-03 MED ORDER — ACETAMINOPHEN 650 MG RE SUPP: 650.0000 mg | Freq: Four times a day (QID) | RECTAL | Status: DC | PRN

## 2023-08-03 MED ORDER — SODIUM CHLORIDE 0.9% FLUSH
3.0000 mL | Freq: Two times a day (BID) | INTRAVENOUS | Status: DC
  Administered 2023-08-03 – 2023-08-05 (×4): 3 mL via INTRAVENOUS

## 2023-08-03 MED ORDER — APIXABAN 5 MG PO TABS: 5.0000 mg | ORAL_TABLET | Freq: Two times a day (BID) | ORAL | Status: DC

## 2023-08-03 MED ORDER — EZETIMIBE 10 MG PO TABS
10.0000 mg | ORAL_TABLET | Freq: Every day | ORAL | Status: DC
  Administered 2023-08-03 – 2023-08-05 (×3): 10 mg via ORAL
  Filled 2023-08-03 (×3): qty 1

## 2023-08-03 MED ORDER — ATORVASTATIN CALCIUM 80 MG PO TABS
80.0000 mg | ORAL_TABLET | Freq: Every day | ORAL | Status: DC
  Administered 2023-08-03 – 2023-08-05 (×3): 80 mg via ORAL
  Filled 2023-08-03: qty 2
  Filled 2023-08-03 (×2): qty 1

## 2023-08-03 MED ORDER — APIXABAN 5 MG PO TABS
5.0000 mg | ORAL_TABLET | Freq: Two times a day (BID) | ORAL | Status: DC
Start: 2023-08-03 — End: 2023-08-05
  Administered 2023-08-03 – 2023-08-05 (×4): 5 mg via ORAL
  Filled 2023-08-03 (×4): qty 1

## 2023-08-03 NOTE — Consult Note (Signed)
 Cardiology Consultation   Patient ID: Sherry Fitzgerald MRN: [id number]; DOB: 09/26/1945  Admit date: 08/03/2023 Date of Consult: 08/03/2023  PCP:  Yolande Toribio MATSU, MD   [facility] Providers Cardiologist:  Vinie JAYSON Maxcy, MD        Patient Profile:   Sherry Fitzgerald is a 78 y.o. female with a hx of stroke, PAF who is being seen 08/03/2023 for the evaluation of bradycardia at the request of Curtistine Dawn, MD.  History of Present Illness:   [name] is a 78 year old female with hypertension, paroxysmal atrial fibrillation, h/o strokes, dementia presented with bradycardia and syncope  Patient was with her friend Rob who was helping out patient's husband from caregiving duties.  Patient and Mr. Genna were walking at [facility] center walking when she suddenly became unresponsive.  Mr. Lucky did not check her pulse, but she was breathing on her own, eyes were open, but would not respond to any questions.  He called 911.  EMS arrived.  She was bradycardic in 30s, and hypotensive 80/40 mmHg.  There were no reported immediate signs of stroke.  She received 400 cc of fluid, with which blood pressure came up to 217/60 mmHg, but her heart rate remained in the 30s.  On arrival to ER, patient denies any chest pain, shortness of breath or any other complaints.  However, patient does have significant dementia at baseline and tells me that she is in the hospital, but that it is 1929.  She has no recollection of any of the recent events.  Patient denies that she took any medications today, it was confirmed with her husband over the phone that she did take all her medications including metoprolol  and Eliquis .  Reviewed EMS EKG and EKG performed here, compared with prior EKGs-details below   Past Medical History:  Diagnosis Date   Atrial fibrillation (HCC)    Cerebrovascular accident (CVA) (HCC)    Hypertension    no medications for years    Past Surgical History:  Procedure  Laterality Date   CHOLECYSTECTOMY  2014     Home Medications:  Prior to Admission medications   Medication Sig Start Date End Date Taking? Authorizing Provider  apixaban  (ELIQUIS ) 5 MG TABS tablet Take 1 tablet (5 mg total) by mouth 2 (two) times daily. 08/05/21   de Clint Kill, Cortney E, NP  atorvastatin  (LIPITOR ) 80 MG tablet Take 1 tablet (80 mg total) by mouth daily. 11/08/17   Genette Cadet, MD  cephALEXin  (KEFLEX ) 500 MG capsule Take 1 capsule (500 mg total) by mouth 2 (two) times daily. 03/23/23   Lenor Hollering, MD  donepezil  (ARICEPT ) 5 MG tablet Take 1 tablet (5 mg total) by mouth at bedtime. 01/14/23   Whitfield Raisin, NP  ezetimibe  (ZETIA ) 10 MG tablet Take 10 mg by mouth daily. 06/09/21   [provider]  losartan  (COZAAR ) 100 MG tablet Take 1 tablet (100 mg total) by mouth daily. 08/06/21   de Clint Kill, Cortney E, NP  memantine  (NAMENDA  XR) 28 MG CP24 24 hr capsule TAKE 1 CAPSULE BY MOUTH EVERY DAY 06/02/23   Whitfield Raisin, NP  metoprolol  tartrate (LOPRESSOR ) 25 MG tablet Take 1 tablet (25 mg total) by mouth 2 (two) times daily. 08/05/21   de Clint Kill, Cortney E, NP    Inpatient Medications: Scheduled Meds:  glucagon  (human recombinant)  2 mg Intravenous Once   Continuous Infusions:  PRN Meds:   Allergies:   No Known  Allergies  Social History:   Social History   Socioeconomic History   Marital status: Married    Spouse name: Not on file   Number of children: Not on file   Years of education: Not on file   Highest education level: Not on file  Occupational History   Occupation: retired  Tobacco Use   Smoking status: Never   Smokeless tobacco: Never  Vaping Use   Vaping status: Not on file  Substance and Sexual Activity   Alcohol  use: Never   Drug use: Never   Sexual activity: Not on file  Other Topics Concern   Not on file  Social History Narrative   Former Editor, commissioning.  Lives alone, but has 5 children in the area who are grown and supportive.   +Financial difficulties - trouble paying bills.  She has been married to her current husband for 3 years.   Social Drivers of Corporate investment banker Strain: Not on file  Food Insecurity: Not on file  Transportation Needs: Not on file  Physical Activity: Not on file  Stress: Not on file  Social Connections: Not on file  Intimate Partner Violence: Not on file    Family History:    Family History  Problem Relation Age of Onset   CVA Mother    Heart disease Father 58   CVA Father    Alzheimer's disease Neg Hx    Dementia Neg Hx      ROS:  Please see the history of present illness.   All other ROS reviewed and negative.     Physical Exam/Data:   Vitals:   08/03/23 1100 08/03/23 1109 08/03/23 1115  BP: (!) 115/43  (!) 118/50  Pulse: (!) 35  60  Resp: 12  14  SpO2: 98% 100% 100%   No intake or output data in the 24 hours ending 08/03/23 1127    03/26/2023    4:56 PM 03/23/2023    1:21 PM 01/14/2023    7:35 AM  Last 3 Weights  Weight (lbs) 116 lb 13.5 oz 115 lb 15.4 oz 116 lb  Weight (kg) 53 kg 52.6 kg 52.617 kg     There is no height or weight on file to calculate BMI.   Physical Exam Vitals and nursing note reviewed.  Constitutional:      General: She is not in acute distress. Neck:     Vascular: No JVD.  Cardiovascular:     Rate and Rhythm: Regular rhythm. Bradycardia present.     Heart sounds: Normal heart sounds. No murmur heard. Pulmonary:     Effort: Pulmonary effort is normal.     Breath sounds: Normal breath sounds. No wheezing or rales.  Musculoskeletal:     Right lower leg: No edema.     Left lower leg: No edema.     EKG:  The EKG was personally reviewed and demonstrates:   EKG 08/03/2023: Sinus bradycardia 38 bpm Left bundle branch block No significant change in ST-T-segment compared to previous EKG on 03/23/2023, sinus bradycardia has replaced A-fib  Telemetry:  Telemetry was personally reviewed and demonstrates: Sinus  bradycardia  Relevant CV Studies: Echocardiogram 08/2021:  1. Left ventricular ejection fraction, by estimation, is 60 to 65%. The  left ventricle has normal function. The left ventricle has no regional  wall motion abnormalities. There is mild concentric left ventricular  hypertrophy of the basal-septal segment.  Left ventricular diastolic parameters are indeterminate. Elevated left  atrial pressure.  2. Right ventricular systolic function is normal. The right ventricular  size is normal. Tricuspid regurgitation signal is inadequate for assessing  PA pressure.   3. Left atrial size was moderately dilated.   4. The mitral valve is normal in structure. Mild mitral valve  regurgitation. No evidence of mitral stenosis.   5. The aortic valve is tricuspid. Aortic valve regurgitation is not  visualized. Aortic valve sclerosis is present, with no evidence of aortic  valve stenosis.   6. The inferior vena cava is normal in size with greater than 50%  respiratory variability, suggesting right atrial pressure of 3 mmHg.   Comparison(s): No significant change from prior study.   Laboratory Data:  Pending   Radiology/Studies:  No results found.   Assessment and Plan:   78 year old female with hypertension, paroxysmal atrial fibrillation, h/o strokes, dementia presented with bradycardia and syncope  Syncope: Presentation with syncope, hypotension in the setting of bradycardia in 30s.  EKG shows sinus bradycardia with left and branch block.-Branch block is not new.  Previous EKG in March showed A-fib, now replaced by sinus bradycardia.  Heart rate improved to 50s with 0.5 atropine  in the ER.  Blood pressure improved from 80/40 mmHg to 117/60 mmHg with 400 cc fluid administered by EMS on route.  Currently, patient has no complaints of chest pain or shortness of breath, although history taking is limited due to patient's baseline dementia.  I am concerned that bradycardia was likely because of  her syncope.  She is on metoprolol  at baseline.  No clear signs of ACS.  There is no clear indication for temporary pacemaker at this time, but reasonable to try glucagon  for reversible beta-blocker effect.  She will need telemetry monitoring for next 24 hours to evaluate for any recurrent bradycardia.  We will continue to follow the patient.    H/o stroke, PAF: Continue Eliquis  5 mg twice daily, hold metoprolol  for now.  I discussed recommendations with patient's friend Lamar, and ER physician Dr. Dasie.  I will talk to patient's husband after he arrives.  Risk Assessment/Risk Scores:   CHA2DS2-VASc Score = 6  This indicates a 9.7% annual risk of stroke. The patient's score is based upon: CHF History: 0 HTN History: 1 Diabetes History: 0 Stroke History: 2 Vascular Disease History: 0 Age Score: 2 Gender Score: 1    CRITICAL CARE Performed by: Newman Lawrence   Total critical care time: 35 minutes   Critical care time was exclusive of separately billable procedures and treating other patients.   Critical care was necessary to treat or prevent imminent or life-threatening deterioration.   Critical care was time spent personally by me on the following activities: development of treatment plan with patient and/or surrogate as well as nursing, discussions with consultants, evaluation of patient's response to treatment, examination of patient, obtaining history from patient or surrogate, ordering and performing treatments and interventions, ordering and review of laboratory studies, ordering and review of radiographic studies, pulse oximetry and re-evaluation of patient's condition.      For questions or updates, please contact [facility] Please consult [email or web address] for contact info under    Signed, Newman JINNY Lawrence, MD  08/03/2023 11:27 AM

## 2023-08-03 NOTE — ED Triage Notes (Signed)
 Patient BIB EMS from the [facility] where a friend states that they took her out to, give her husband a break. Patient has a history of stroke and dementia and is currently at her baseline. Friend told EMS that the patient had a syncopal episode, but did not fall as he caught her before she fell and sat her on a bench. Patient had a BP of 80/40 upon EMS arrival with a heart rate of 40.

## 2023-08-03 NOTE — Progress Notes (Signed)
 IV team to bedside for PIV placement. Bilateral arms assessed using ultrasound. First RN attempt unsuccessful to right upper arm. Left arm assessed with ultrasound, no suitable vein found at this time and patient still with functional IV in left wrist. Recommend to maintain current access at this time.

## 2023-08-03 NOTE — ED Provider Notes (Signed)
 [place] EMERGENCY DEPARTMENT AT [facility] Provider Note   CSN: [id number] Arrival date & time: 08/03/23  1039     Patient presents with: Loss of Consciousness   Sherry Fitzgerald is a 78 y.o. female.   77 y.o female with a PMH of Afib on eliquis , stroke, HTN presents to the ED via EMS for near syncope. [facility] by her brother's son, he reports that patient was ambulating from one place to another when suddenly her head slumped over, she was not responding to any verbal or physical stimuli for approximately 5 to 10 minutes.  He reports calling EMS and they arrived to the scene, where her blood pressure was very low.  Husband does report that she took all her blood pressure medication today.  This has happened in the past, where she has been dehydrated .  She did not fall to the ground as her brother-in-law lowered her to the ground.  She is anticoagulated on Eliquis , last took this medication this morning.  He also reports she was not following commands for that particular time. No recent sick contacts, no changes in medication, no other complaints reported.   The history is provided by the patient.       Prior to Admission medications   Medication Sig Start Date End Date Taking? Authorizing Provider  apixaban  (ELIQUIS ) 5 MG TABS tablet Take 1 tablet (5 mg total) by mouth 2 (two) times daily. 08/05/21   de Clint Kill, Cortney E, NP  atorvastatin  (LIPITOR ) 80 MG tablet Take 1 tablet (80 mg total) by mouth daily. 11/08/17   Genette Cadet, MD  cephALEXin  (KEFLEX ) 500 MG capsule Take 1 capsule (500 mg total) by mouth 2 (two) times daily. 03/23/23   Lenor Hollering, MD  donepezil  (ARICEPT ) 5 MG tablet Take 1 tablet (5 mg total) by mouth at bedtime. 01/14/23   Whitfield Raisin, NP  ezetimibe  (ZETIA ) 10 MG tablet Take 10 mg by mouth daily. 06/09/21   [provider]  losartan  (COZAAR ) 100 MG tablet Take 1 tablet (100 mg total) by  mouth daily. 08/06/21   de Clint Kill, Cortney E, NP  memantine  (NAMENDA  XR) 28 MG CP24 24 hr capsule TAKE 1 CAPSULE BY MOUTH EVERY DAY 06/02/23   Whitfield Raisin, NP  metoprolol  tartrate (LOPRESSOR ) 25 MG tablet Take 1 tablet (25 mg total) by mouth 2 (two) times daily. 08/05/21   de Clint Kill, Earle BRAVO, NP    Allergies: Patient has no known allergies.    Review of Systems  Constitutional:  Negative for chills and fever.  Respiratory:  Negative for shortness of breath.   Cardiovascular:  Negative for chest pain.  Gastrointestinal:  Negative for abdominal pain and vomiting.  Musculoskeletal:  Negative for back pain.  Neurological:  Positive for syncope.  All other systems reviewed and are negative.   Updated Vital Signs BP 138/68   Pulse (!) 44   Temp 97.7 F (36.5 C) (Oral)   Resp 11   SpO2 100%   Physical Exam Vitals and nursing note reviewed.  Constitutional:      Appearance: Normal appearance.  HENT:     Head: Normocephalic and atraumatic.     Comments: No visible signs of trauma.     Mouth/Throat:     Mouth: Mucous membranes are moist.  Eyes:     Pupils: Pupils are equal, round, and reactive to light.  Cardiovascular:  Rate and Rhythm: Bradycardia present.  Pulmonary:     Effort: Pulmonary effort is normal.     Breath sounds: No wheezing or rales.  Abdominal:     General: Abdomen is flat.     Tenderness: There is no abdominal tenderness.  Musculoskeletal:     Cervical back: Normal range of motion and neck supple.  Skin:    General: Skin is warm and dry.  Neurological:     Mental Status: Mental status is at baseline. She is disoriented.     (all labs ordered are listed, but only abnormal results are displayed) Labs Reviewed  CBC WITH DIFFERENTIAL/PLATELET - Abnormal; Notable for the following components:      Result Value   WBC 10.9 (*)    RBC 5.17 (*)    Hemoglobin 11.9 (*)    MCV 78.1 (*)    MCH 23.0 (*)    MCHC 29.5 (*)    Neutro Abs 8.9 (*)    All  other components within normal limits  COMPREHENSIVE METABOLIC PANEL WITH GFR - Abnormal; Notable for the following components:   Potassium 5.5 (*)    Glucose, Bld 120 (*)    Creatinine, Ser 1.47 (*)    GFR, Estimated 37 (*)    All other components within normal limits  PROTIME-INR - Abnormal; Notable for the following components:   Prothrombin Time 18.3 (*)    INR 1.4 (*)    All other components within normal limits  URINALYSIS, ROUTINE W REFLEX MICROSCOPIC  CK  MAGNESIUM  TSH  T4, FREE  I-STAT CG4 LACTIC ACID, ED    EKG: EKG Interpretation Date/Time:  Tuesday August 03 2023 10:46:43 EDT Ventricular Rate:  38 PR Interval:  156 QRS Duration:  158 QT Interval:  588 QTC Calculation: 468 R Axis:   51  Text Interpretation: Sinus bradycardia Left bundle branch block Confirmed by Sherry Fitzgerald ([id number]) on 08/03/2023 10:54:24 AM  Radiology: CT HEAD WO CONTRAST ( ) Result Date: 08/03/2023 CLINICAL DATA:  Provided history: Head trauma, abnormal mental status. Near syncope. EXAM: CT HEAD WITHOUT CONTRAST TECHNIQUE: Contiguous axial images were obtained from the base of the skull through the vertex without intravenous contrast. RADIATION DOSE REDUCTION: This exam was performed according to the departmental dose-optimization program which includes automated exposure control, adjustment of the mA and/or kV according to patient size and/or use of iterative reconstruction technique. COMPARISON:  Head CT 03/23/2023. FINDINGS: Brain: Mild generalized cerebral atrophy. Known chronic infarcts within/about the bilateral basal ganglia. Moderate background cerebral white matter chronic small vessel ischemic disease, overall better appreciated on the prior MRI of 08/05/2021. There is no acute intracranial hemorrhage. No demarcated cortical infarct. No extra-axial fluid collection. No evidence of an intracranial mass. No midline shift. Vascular: No hyperdense vessel.  Atherosclerotic calcifications. Skull:  No calvarial fracture or aggressive osseous lesion. Sinuses/Orbits: No mass or acute finding within the imaged orbits. No significant paranasal sinus disease at the imaged levels. IMPRESSION: 1.  No evidence of an acute intracranial abnormality. 2. Parenchymal atrophy, chronic small vessel ischemic disease and chronic lacunar infarcts, as described. Electronically Signed   By: Rockey Childs D.O.   On: 08/03/2023 12:35     .Critical Care  Performed by: Wilhemina Grall, PA-C Authorized by: Abyan Cadman, PA-C   Critical care provider statement:    Critical care time (minutes):  45   Critical care start time:  08/03/2023 12:15 PM   Critical care end time:  08/03/2023 1:00 PM   Critical care was  necessary to treat or prevent imminent or life-threatening deterioration of the following conditions:  Cardiac failure   Critical care was time spent personally by me on the following activities:  Development of treatment plan with patient or surrogate, discussions with consultants, evaluation of patient's response to treatment, examination of patient, ordering and review of laboratory studies, ordering and review of radiographic studies, ordering and performing treatments and interventions, pulse oximetry, re-evaluation of patient's condition and review of old charts   Care discussed with: admitting provider      Medications Ordered in the ED  glucagon  (human recombinant) (GLUCAGEN) injection 2 mg (0 mg Intravenous Hold 08/03/23 1140)  sodium chloride  0.9 % bolus 500 mL (has no administration in time range)  ondansetron  (ZOFRAN ) injection 4 mg (4 mg Intravenous Given 08/03/23 1112)  atropine  injection 0.5 mg (0.5 mg Intravenous Given 08/03/23 1113)    Clinical Course as of 08/03/23 1317  Tue Aug 03, 2023  1252 Potassium(!): 5.5 Elevated from baseline, no prior level for comparison.  [JS]    Clinical Course User Index [JS] Izaih Kataoka, PA-C                                 Medical Decision Making Amount  and/or Complexity of Data Reviewed Labs: ordered. Decision-making details documented in ED Course. Radiology: ordered.  Risk Prescription drug management.   This patient presents to the ED for concern of near syncope, this involves a number of treatment options, and is a complaint that carries with it a high risk of complications and morbidity.  The differential diagnosis includes ACS, symptomatic bradycardia, dehydration.    Co morbidities: Discussed in HPI   Brief History:  See HPI.   EMR reviewed including pt PMHx, past surgical history and past visits to ER.   See HPI for more details   Lab Tests:  I ordered and independently interpreted labs.  The pertinent results include:  CBC with a slight leukocytosis of 10.9, hemoglobin is at her baseline.  CMP remarkable for some hyperkalemia which is new to patient, no prior history of kidney disease.  Does have a slight elevation of her creatinine at 1.47.  LFTs are within normal limits.  Imaging Studies:  CT Head showed: IMPRESSION:  1.  No evidence of an acute intracranial abnormality.  2. Parenchymal atrophy, chronic small vessel ischemic disease and  chronic lacunar infarcts, as described.   Cardiac Monitoring:  The patient was maintained on a cardiac monitor.  I personally viewed and interpreted the cardiac monitored which showed an underlying rhythm of: Sinus bradycardia  EKG non-ischemic  Medicines ordered:  I ordered medication including atropine , zofran   for symptomatic treatment  Reevaluation of the patient after these medicines showed that the patient improved I have reviewed the patients home medicines and have made adjustments as needed  Critical Interventions:  Given atropine  for symptomatic bradycardia.   Consults:  I requested consultation with Dr. Elmira  and discussed lab and imaging findings as well as pertinent plan - they recommend: admission via hospitaslist, they will consult. OK to give  atropine  and hold off on glucagon .   Reevaluation:  After the interventions noted above I re-evaluated patient and found that they have :improved   Social Determinants of Health:  The patient's social determinants of health were a factor in the care of this patient   Problem List / ED Course:  Patient presented to the ED for near  syncopal episode which occurred at this time and center today accompanied by her brother-in-law.  He reports her head slumped over after ambulating from 1 side of the side center to the other, reports that he could not get her to respond, she was not following any commands.  He laid her down on the bench.  She did not strike her head, she did not fall.  She is anticoagulated on Eliquis  for underlying history of A-fib.  Blood work here is remarkable for a potassium of 5.5, no prior history of hyperkalemia.  Creatinine level slightly elevated, her husband at the bedside does report that it is hard for her to drink fluids, she does eat, but in very small portions.  She does have a history of underlying dementia, he reports that she does not know where she is at times, is unsure what medication she should take.  She did take all her medications this morning including her Eliquis . CT head did not show any acute hemorrhage, no CVA, no other abnormality noted.  EKG was remarkable for bradycardia.  On arrival patient did have a heart rate in the 30s, blood pressure was low when EMS arrived to the scene, but this later improved after 400 mL of fluid.  Blood pressure did trend upwards to 114 systolic over 70. X-ray of her chest reviewed and interpreted by me without any acute findings.  EKG was also remarkable for a left bundle branch block which she does have a prior history of, she was evaluated by cardiology Dr. Elmira who recommended atropine , along with followed by glucagon  if bradycardia does not improve in order to help reverse her beta blocker.  Patient's heart rate  continues to be in the 40s, will continue with hydration of 500 mL, also spoke to hospitalist Dr. Tobie who is agreeable of admission at this time.  Will continue to monitor patient's bradycardia closely.   Dispostion:  After consideration of the diagnostic results and the patients response to treatment, I feel that the patent would benefit from admission to due symptomatic bradycardia   Portions of this note were generated with Dragon dictation software. Dictation errors may occur despite best attempts at proofreading.      Final diagnoses:  Near syncope  Symptomatic bradycardia    ED Discharge Orders     None          Sherry Fitzgerald, Sherry Fitzgerald 08/03/23 1317    Sherry Faden, MD 08/04/23 [phone]

## 2023-08-03 NOTE — H&P (Addendum)
 History and Physical    Patient: Sherry Fitzgerald FMW:[id number] DOB: 08-28-1945 DOA: 08/03/2023 DOS: the patient was seen and examined on 08/03/2023 . PCP: Yolande Toribio MATSU, MD  Patient coming from: Home Chief complaint: Chief Complaint  Patient presents with   Loss of Consciousness   HPI:  Sherry Fitzgerald is a 78 y.o. female with past medical history  of hypertension, history of CVA, hyperlipidemia, hyperglycemia, dementia, presenting for syncopal and collapse episode.  Patient lives at home with spouse and children.  Patient on initial presentation was found to have systolic of 80.  Report 400 cc of normal saline which improved the blood pressure to a systolic of 117.  Cardiology was consulted and saw patient.  Patient is not able to give a history family is cooperative.  Per spouse at bedside [facility] and was unconscious for about 10 minutes after which she started to come to recovery was slow and when she did wake up she tried to stand up and she was dizzy at that time and he helped her on a bench and called EMS. ED Course:  Vital signs in the ED were notable for the following:  Vitals:   08/03/23 1445 08/03/23 1515 08/03/23 1653 08/03/23 1823  BP:    (!) 132/57  Pulse: (!) 52 (!) 43  (!) 51  Temp:   98 F (36.7 C)   Resp: 18 12  16   SpO2: 99% 100%  100%  TempSrc:   Oral   >>ED evaluation thus far shows: EKG today shows sinus bradycardia with a left bundle branch block with heart rate of 38 PR 156 QRS of 158 QTc of 468. CMP shows potassium 5.5 glucose 120 AKI with a creatinine of 1.47 EGFR of 37 normal LFTs. CBC shows white count of 10.9 hemoglobin of 11.9 MCV 78.1 normal platelets at 181. PT 18.3 INR 1.4. Head CT negative for any acute intracranial abnormality patient does have chronic small vessel ischemic disease and chronic lacunar infarcts. Chest x-ray negative for any acute process.   >>While in the ED patient received the  following: Medications  glucagon  (human recombinant) (GLUCAGEN) injection 2 mg (0 mg Intravenous Hold 08/03/23 1140)  ondansetron  (ZOFRAN ) injection 4 mg (4 mg Intravenous Given 08/03/23 1112)  atropine  injection 0.5 mg (0.5 mg Intravenous Given 08/03/23 1113)   Review of Systems  Unable to perform ROS: Dementia  Neurological:  Positive for loss of consciousness.  All other systems reviewed and are negative.  Past Medical History:  Diagnosis Date   Atrial fibrillation [facility])    Cerebrovascular accident (CVA) (HCC)    Hypertension    no medications for years   Past Surgical History:  Procedure Laterality Date   CHOLECYSTECTOMY  2014    reports that she has never smoked. She has never used smokeless tobacco. She reports that she does not drink alcohol  and does not use drugs. No Known Allergies Family History  Problem Relation Age of Onset   CVA Mother    Heart disease Father 26   CVA Father    Alzheimer's disease Neg Hx    Dementia Neg Hx    Prior to Admission medications   Medication Sig Start Date End Date Taking? Authorizing Provider  apixaban  (ELIQUIS ) 5 MG TABS tablet Take 1 tablet (5 mg total) by mouth 2 (two) times daily. 08/05/21   de Clint Kill, Cortney E, NP  atorvastatin  (LIPITOR ) 80 MG tablet Take 1 tablet (80 mg  total) by mouth daily. 11/08/17   Genette Cadet, MD  cephALEXin  (KEFLEX ) 500 MG capsule Take 1 capsule (500 mg total) by mouth 2 (two) times daily. 03/23/23   Lenor Hollering, MD  donepezil  (ARICEPT ) 5 MG tablet Take 1 tablet (5 mg total) by mouth at bedtime. 01/14/23   Whitfield Raisin, NP  ezetimibe  (ZETIA ) 10 MG tablet Take 10 mg by mouth daily. 06/09/21   [provider]  losartan  (COZAAR ) 100 MG tablet Take 1 tablet (100 mg total) by mouth daily. 08/06/21   de Clint Kill, Cortney E, NP  memantine  (NAMENDA  XR) 28 MG CP24 24 hr capsule TAKE 1 CAPSULE BY MOUTH EVERY DAY 06/02/23   Whitfield Raisin, NP  metoprolol  tartrate (LOPRESSOR ) 25 MG tablet Take 1 tablet (25 mg  total) by mouth 2 (two) times daily. 08/05/21   de Clint Kill, Cortney E, NP                                                                                 Vitals:   08/03/23 1445 08/03/23 1515 08/03/23 1653 08/03/23 1823  BP:    (!) 132/57  Pulse: (!) 52 (!) 43  (!) 51  Resp: 18 12  16   Temp:   98 F (36.7 C)   TempSrc:   Oral   SpO2: 99% 100%  100%   Physical Exam Vitals and nursing note reviewed.  Constitutional:      General: She is not in acute distress.    Appearance: She is not ill-appearing.  HENT:     Head: Normocephalic and atraumatic.     Right Ear: Hearing and external ear normal.     Left Ear: Hearing and external ear normal.     Nose: Nose normal. No nasal deformity.     Mouth/Throat:     Lips: Pink.  Eyes:     General: Lids are normal.     Extraocular Movements: Extraocular movements intact.     Pupils: Pupils are equal, round, and reactive to light.  Cardiovascular:     Rate and Rhythm: Normal rate and regular rhythm.     Heart sounds: Normal heart sounds.  Pulmonary:     Effort: Pulmonary effort is normal.     Breath sounds: Normal breath sounds.  Abdominal:     General: Bowel sounds are normal. There is no distension.     Palpations: Abdomen is soft. There is no mass.     Tenderness: There is no abdominal tenderness.  Musculoskeletal:     Right lower leg: No edema.     Left lower leg: No edema.  Skin:    General: Skin is warm.  Neurological:     General: No focal deficit present.     Mental Status: She is alert. She is disoriented.     Cranial Nerves: Cranial nerves 2-12 are intact. No cranial nerve deficit, dysarthria or facial asymmetry.     Motor: Motor function is intact. No weakness, tremor, atrophy, seizure activity or pronator drift.     Coordination: Coordination normal. Finger-Nose-Finger Test and Heel to [facility] Test normal.     Deep Tendon Reflexes:     Reflex Scores:  Bicep reflexes are 1+ on the right side and 1+ on the left side.       Patellar reflexes are 1+ on the right side and 1+ on the left side. Psychiatric:        Speech: Speech normal.     Labs on Admission: I have personally reviewed following labs and imaging studies CBC: Recent Labs  Lab 08/03/23 1130  WBC 10.9*  NEUTROABS 8.9*  HGB 11.9*  HCT 40.4  MCV 78.1*  PLT 181   Basic Metabolic Panel: Recent Labs  Lab 08/03/23 1130  NA 140  K 5.5*  CL 107  CO2 27  GLUCOSE 120*  BUN 18  CREATININE 1.47*  CALCIUM  8.9   GFR: CrCl cannot be calculated (Unknown ideal weight.). Liver Function Tests: Recent Labs  Lab 08/03/23 1130  AST 27  ALT 29  ALKPHOS 81  BILITOT 0.9  PROT 7.2  ALBUMIN 3.6   No results for input(s): LIPASE, AMYLASE in the last 168 hours. No results for input(s): AMMONIA in the last 168 hours. Coagulation Profile: Recent Labs  Lab 08/03/23 1130  INR 1.4*   Cardiac Enzymes: No results for input(s): CKTOTAL, CKMB, CKMBINDEX, TROPONINI in the last 168 hours. BNP (last 3 results) No results for input(s): PROBNP in the last 8760 hours. HbA1C: No results for input(s): HGBA1C in the last 72 hours. CBG: No results for input(s): GLUCAP in the last 168 hours. Lipid Profile: No results for input(s): CHOL, HDL, LDLCALC, TRIG, CHOLHDL, LDLDIRECT in the last 72 hours. Thyroid  Function Tests: No results for input(s): TSH, T4TOTAL, FREET4, T3FREE, THYROIDAB in the last 72 hours. Anemia Panel: No results for input(s): VITAMINB12, FOLATE, FERRITIN, TIBC, IRON, RETICCTPCT in the last 72 hours. Urine analysis:    Component Value Date/Time   COLORURINE YELLOW 03/23/2023 1443   APPEARANCEUR CLEAR 03/23/2023 1443   LABSPEC 1.022 03/23/2023 1443   PHURINE 5.0 03/23/2023 1443   GLUCOSEU NEGATIVE 03/23/2023 1443   HGBUR NEGATIVE 03/23/2023 1443   BILIRUBINUR NEGATIVE 03/23/2023 1443   KETONESUR NEGATIVE 03/23/2023 1443   PROTEINUR TRACE (A) 03/23/2023 1443   NITRITE NEGATIVE  03/23/2023 1443   LEUKOCYTESUR SMALL (A) 03/23/2023 1443   Radiological Exams on Admission: DG Chest Portable 1 View Result Date: 08/03/2023 CLINICAL DATA:  Near syncope. EXAM: PORTABLE CHEST 1 VIEW COMPARISON:  Chest radiograph dated 03/23/2023. FINDINGS: The heart size and mediastinal contours are within normal limits. Both lungs are clear. The visualized skeletal structures are unremarkable. IMPRESSION: No active disease. Electronically Signed   By: Vanetta Chou M.D.   On: 08/03/2023 13:16   CT HEAD WO CONTRAST ( ) Result Date: 08/03/2023 CLINICAL DATA:  Provided history: Head trauma, abnormal mental status. Near syncope. EXAM: CT HEAD WITHOUT CONTRAST TECHNIQUE: Contiguous axial images were obtained from the base of the skull through the vertex without intravenous contrast. RADIATION DOSE REDUCTION: This exam was performed according to the departmental dose-optimization program which includes automated exposure control, adjustment of the mA and/or kV according to patient size and/or use of iterative reconstruction technique. COMPARISON:  Head CT 03/23/2023. FINDINGS: Brain: Mild generalized cerebral atrophy. Known chronic infarcts within/about the bilateral basal ganglia. Moderate background cerebral white matter chronic small vessel ischemic disease, overall better appreciated on the prior MRI of 08/05/2021. There is no acute intracranial hemorrhage. No demarcated cortical infarct. No extra-axial fluid collection. No evidence of an intracranial mass. No midline shift. Vascular: No hyperdense vessel.  Atherosclerotic calcifications. Skull: No calvarial fracture or aggressive osseous lesion. Sinuses/Orbits: No mass  or acute finding within the imaged orbits. No significant paranasal sinus disease at the imaged levels. IMPRESSION: 1.  No evidence of an acute intracranial abnormality. 2. Parenchymal atrophy, chronic small vessel ischemic disease and chronic lacunar infarcts, as described.  Electronically Signed   By: Rockey Childs D.O.   On: 08/03/2023 12:35   Data Reviewed: Relevant notes from primary care and specialist visits, past discharge summaries as available in EHR, including Care Everywhere . Prior diagnostic testing as pertinent to current admission diagnoses, Updated medications and problem lists for reconciliation .ED course, including vitals, labs, imaging, treatment and response to treatment,Triage notes, nursing and pharmacy notes and ED provider's notes.Notable results as noted in HPI.Discussed case with EDMD/ ED APP/ or Specialty MD on call and as needed.  Assessment & Plan  >> Syncope and collapse: Will admit patient for TIA CVA syncope evaluation.  Suspect this is related to her bradycardia. Will obtain an MRI and carotid Dopplers along with 2D echocardiogram. Continue patient on Eliquis .  >> Abnormal EKG/bradycardia/left arm bundle branch block: Appreciate cardiology consult management. Will currently hold patient's metoprolol .   >> Paroxysmal atrial fibrillation /History of CVA: Patient is currently on Eliquis  will continue if patient passes swallow evaluation. SCDs EKG shows sinus bradycardia at 38 PR interval of 156 left bundle branch block QTc of 468.   >> Mixed hyperlipidemia: New Lipitor .   >> Dementia: Will continue patient on her donepezil  and memantine .   >> Essential hypertension: Will hold patient's losartan  hold patient's metoprolol  although on med rec states that patient has not been taking her metoprolol . As needed hydralazine .    DVT prophylaxis:  Eliquis  Consults:  Cardiology  Advance Care Planning:    Code Status: Full Code   Family Communication:  Spouse Disposition Plan:  To be determined Severity of Illness: The appropriate patient status for this patient is INPATIENT. Inpatient status is judged to be reasonable and necessary in order to provide the required intensity of service to ensure the patient's safety. The  patient's presenting symptoms, physical exam findings, and initial radiographic and laboratory data in the context of their chronic comorbidities is felt to place them at high risk for further clinical deterioration. Furthermore, it is not anticipated that the patient will be medically stable for discharge from the hospital within 2 midnights of admission.   * I certify that at the point of admission it is my clinical judgment that the patient will require inpatient hospital care spanning beyond 2 midnights from the point of admission due to high intensity of service, high risk for further deterioration and high frequency of surveillance required.*  Unresulted Labs (From admission, onward)     Start     Ordered   08/04/23 0500  Comprehensive metabolic panel  Tomorrow morning,   R        08/03/23 1411   08/04/23 0500  CBC  Tomorrow morning,   R        08/03/23 1411   08/03/23 1317  Magnesium  Once,   STAT        08/03/23 1316   08/03/23 1317  TSH  Once,   URGENT        08/03/23 1316   08/03/23 1317  T4, free  Once,   URGENT        08/03/23 1316   08/03/23 1315  CK  Once,   URGENT        08/03/23 1314   08/03/23 1051  Urinalysis, Routine w reflex microscopic -Urine,  Clean Catch  Once,   URGENT       Question:  Specimen Source  Answer:  Urine, Clean Catch   08/03/23 1050            Meds ordered this encounter  Medications   glucagon  (human recombinant) (GLUCAGEN) injection 2 mg   ondansetron  (ZOFRAN ) injection 4 mg   atropine  injection 0.5 mg   sodium chloride  0.9 % bolus 500 mL   DISCONTD: apixaban  (ELIQUIS ) tablet 5 mg   atorvastatin  (LIPITOR ) tablet 80 mg   memantine  (NAMENDA  XR) 24 hr capsule 28 mg   donepezil  (ARICEPT ) tablet 5 mg   ezetimibe  (ZETIA ) tablet 10 mg   apixaban  (ELIQUIS ) tablet 5 mg   sodium chloride  flush (NS) 0.9 % injection 3 mL   lactated ringers  infusion   OR Linked Order Group    acetaminophen  (TYLENOL ) tablet 650 mg    acetaminophen  (TYLENOL )  suppository 650 mg     Orders Placed This Encounter  Procedures   Critical Care   DG Chest Portable 1 View   CT HEAD WO CONTRAST ( )   MR BRAIN WO CONTRAST   CBC with Differential   Comprehensive metabolic panel   Protime-INR   Urinalysis, Routine w reflex microscopic -Urine, Clean Catch   CK   Magnesium   TSH   T4, free   Comprehensive metabolic panel   CBC   Diet Heart Room service appropriate? Yes; Fluid consistency: Thin   Check temperature   Cardiac monitoring   Maintain IV access   Vital signs   Orthostatic vital signs on admission   Notify physician (specify)   Refer to Sidebar Report Mobility Protocol for Adult Inpatient   Daily weights   If patient diabetic or glucose greater than 140 notify physician for Sliding Scale Insulin Orders   Intake and output   Neuro checks   Apply Syncope Care Plan   Initiate Oral Care Protocol   Initiate Carrier Fluid Protocol   Ambulate with assistance   Swallow screen   Full code   Consult to hospitalist   Pulse oximetry with vital signs   I-Stat CG4 Lactic Acid   EKG 12-Lead   ED EKG   EKG 12-Lead   ECHOCARDIOGRAM COMPLETE   Admit to Inpatient (patient's expected length of stay will be greater than 2 midnights or inpatient only procedure)    Author: Mario LULLA Blanch, MD 12 pm- 8 pm. [facility]. 08/03/2023 7:15 PM Please note for any communication after hours contact [facility] Assigned provider on call on Amion.

## 2023-08-03 NOTE — ED Notes (Signed)
 Pt transported from MRI to 45

## 2023-08-03 NOTE — ED Provider Notes (Signed)
 I provided a substantive portion of the care of this patient.  I personally made/approved the management plan for this patient and take responsibility for the patient management.  EKG Interpretation Date/Time:  Tuesday August 03 2023 10:46:43 EDT Ventricular Rate:  38 PR Interval:  156 QRS Duration:  158 QT Interval:  588 QTC Calculation: 468 R Axis:   51  Text Interpretation: Sinus bradycardia Left bundle branch block Confirmed by Dasie Faden ([id number]) on 08/03/2023 10:54:24 AM   Patient here for near syncopal event prior to arrival.  History of A-fib and is on Eliquis .  She also takes Lopressor  took her usual dose today.  EKG shows sinus bradycardia.  Left bundle branch block is old.  Blood pressure now is stable as before she was hypotensive.  Will give glucagon  and monitor at this point.  Will need admission for observation   Dasie Faden, MD 08/03/23 1055

## 2023-08-03 NOTE — ED Notes (Signed)
 Got patient into a gown on the monitor did EKG shown to Dr Dasie patient is resting with nurses at bedside placed patient on the zoll

## 2023-08-04 ENCOUNTER — Inpatient Hospital Stay (HOSPITAL_COMMUNITY)

## 2023-08-04 DIAGNOSIS — R55 Syncope and collapse: Secondary | ICD-10-CM | POA: Diagnosis not present

## 2023-08-04 LAB — T4, FREE: Free T4: 0.79 ng/dL (ref 0.61–1.12)

## 2023-08-04 LAB — CK: Total CK: 94 U/L (ref 38–234)

## 2023-08-04 LAB — ECHOCARDIOGRAM COMPLETE
Area-P 1/2: 2.54 cm2
Calc EF: 71.2 %
Height: 56 in
S' Lateral: 2 cm
Single Plane A2C EF: 74.3 %
Single Plane A4C EF: 66.7 %
Weight: 1825.6 [oz_av]

## 2023-08-04 LAB — COMPREHENSIVE METABOLIC PANEL WITH GFR
ALT: 26 U/L (ref 0–44)
AST: 24 U/L (ref 15–41)
Albumin: 3.2 g/dL — ABNORMAL LOW (ref 3.5–5.0)
Alkaline Phosphatase: 66 U/L (ref 38–126)
Anion gap: 8 (ref 5–15)
BUN: 20 mg/dL (ref 8–23)
CO2: 24 mmol/L (ref 22–32)
Calcium: 8.9 mg/dL (ref 8.9–10.3)
Chloride: 107 mmol/L (ref 98–111)
Creatinine, Ser: 1.52 mg/dL — ABNORMAL HIGH (ref 0.44–1.00)
GFR, Estimated: 36 mL/min — ABNORMAL LOW (ref >60)
Glucose, Bld: 105 mg/dL — ABNORMAL HIGH (ref 70–99)
Potassium: 4.5 mmol/L (ref 3.5–5.1)
Sodium: 139 mmol/L (ref 135–145)
Total Bilirubin: 0.9 mg/dL (ref 0.0–1.2)
Total Protein: 6.3 g/dL — ABNORMAL LOW (ref 6.5–8.1)

## 2023-08-04 LAB — GLUCOSE, CAPILLARY: Glucose-Capillary: 92 mg/dL (ref 70–99)

## 2023-08-04 LAB — CBC
HCT: 33.4 % — ABNORMAL LOW (ref 36.0–46.0)
Hemoglobin: 9.9 g/dL — ABNORMAL LOW (ref 12.0–15.0)
MCH: 22.9 pg — ABNORMAL LOW (ref 26.0–34.0)
MCHC: 29.6 g/dL — ABNORMAL LOW (ref 30.0–36.0)
MCV: 77.1 fL — ABNORMAL LOW (ref 80.0–100.0)
Platelets: 174 K/uL (ref 150–400)
RBC: 4.33 MIL/uL (ref 3.87–5.11)
RDW: 14.6 % (ref 11.5–15.5)
WBC: 9.1 K/uL (ref 4.0–10.5)
nRBC: 0 % (ref 0.0–0.2)

## 2023-08-04 LAB — VITAMIN B12: Vitamin B-12: 237 pg/mL (ref 180–914)

## 2023-08-04 LAB — TSH: TSH: 2.013 u[IU]/mL (ref 0.350–4.500)

## 2023-08-04 LAB — IRON AND TIBC
Iron: 40 ug/dL (ref 28–170)
Saturation Ratios: 14 % (ref 10.4–31.8)
TIBC: 287 ug/dL (ref 250–450)
UIBC: 247 ug/dL

## 2023-08-04 LAB — FERRITIN: Ferritin: 68 ng/mL (ref 11–307)

## 2023-08-04 LAB — MAGNESIUM: Magnesium: 2.1 mg/dL (ref 1.7–2.4)

## 2023-08-04 MED ORDER — AMIODARONE HCL IN DEXTROSE 360-4.14 MG/200ML-% IV SOLN
60.0000 mg/h | INTRAVENOUS | Status: DC
Start: 2023-08-04 — End: 2023-08-05
  Administered 2023-08-04 – 2023-08-05 (×2): 60 mg/h via INTRAVENOUS
  Filled 2023-08-04 (×2): qty 200

## 2023-08-04 MED ORDER — ONDANSETRON HCL 4 MG/2ML IJ SOLN: 4.0000 mg | Freq: Four times a day (QID) | INTRAMUSCULAR | Status: DC | PRN

## 2023-08-04 MED ORDER — HYDRALAZINE HCL 20 MG/ML IJ SOLN: 10.0000 mg | INTRAMUSCULAR | Status: DC | PRN

## 2023-08-04 MED ORDER — AMIODARONE HCL IN DEXTROSE 360-4.14 MG/200ML-% IV SOLN: 30.0000 mg/h | INTRAVENOUS | Status: DC

## 2023-08-04 MED ORDER — AMIODARONE LOAD VIA INFUSION
150.0000 mg | Freq: Once | INTRAVENOUS | Status: AC
  Administered 2023-08-04: 150 mg via INTRAVENOUS
  Filled 2023-08-04 (×2): qty 83.34

## 2023-08-04 MED ORDER — SENNOSIDES-DOCUSATE SODIUM 8.6-50 MG PO TABS: 1.0000 | ORAL_TABLET | Freq: Every evening | ORAL | Status: DC | PRN

## 2023-08-04 MED ORDER — GUAIFENESIN 100 MG/5ML PO LIQD: 5.0000 mL | ORAL | Status: DC | PRN

## 2023-08-04 MED ORDER — IPRATROPIUM-ALBUTEROL 0.5-2.5 (3) MG/3ML IN SOLN: 3.0000 mL | RESPIRATORY_TRACT | Status: DC | PRN

## 2023-08-04 NOTE — Progress Notes (Signed)
 Mobility Specialist Progress Note:   08/04/23 1102  Mobility  Activity Ambulated with assistance  Level of Assistance Contact guard assist, steadying assist  Assistive Device None  Distance Ambulated (ft) 30 ft  Activity Response Tolerated well  Mobility Referral Yes  Mobility visit 1 Mobility  Mobility Specialist Start Time (ACUTE ONLY) 1102  Mobility Specialist Stop Time (ACUTE ONLY) 1113  Mobility Specialist Time Calculation (min) (ACUTE ONLY) 11 min   Responded to bed alarm, pt requesting to go to BR. Able to ambulate with no AD use and minG for safety d/t impulsivity. No c/o throughout. Pt back in bed with all needs met.   Therisa Rana Mobility Specialist Please contact via SecureChat or  Rehab office at [phone]

## 2023-08-04 NOTE — Progress Notes (Signed)
 Patient appeared to convert to afib w rvr after returning from bedside commode. Denies symptoms but remains confused. Vital signs stable as charted though heart rate sustaining in the 120s-150s. 12 lead EKG obtained. Cardiology PA notified.  Due to labile heart rates (bradycardic in the 30s in ED), cardiology PA declines to intervene in the absence of symptoms.

## 2023-08-04 NOTE — Progress Notes (Addendum)
 Progress Note  Patient Name: Sherry Fitzgerald Date of Encounter: 08/04/2023  Primary Cardiologist: Vinie JAYSON Maxcy, MD  Subjective   Does not recall why she is in the hospital, but feeling better today. No CP, SOB, palpitations. Husband Marcey at bedside. No recent melena, BRBPR, or other signs of bleeding.  Inpatient Medications    Scheduled Meds:  apixaban   5 mg Oral Q12H   atorvastatin   80 mg Oral Daily   donepezil   5 mg Oral QHS   ezetimibe   10 mg Oral Daily   glucagon  (human recombinant)  2 mg Intravenous Once   memantine   28 mg Oral Daily   sodium chloride  flush  3 mL Intravenous Q12H   Continuous Infusions:  PRN Meds: acetaminophen  **OR** acetaminophen , guaiFENesin , hydrALAZINE , ipratropium-albuterol , ondansetron  (ZOFRAN ) IV, senna-docusate   Vital Signs    Vitals:   08/04/23 0153 08/04/23 0410 08/04/23 0724 08/04/23 0855  BP: (!) 151/53 (!) 131/51 (!) 153/60 121/63  Pulse: (!) 46 (!) 43    Resp: 11 12 11 13   Temp: 97.7 F (36.5 C) 97.9 F (36.6 C) 97.9 F (36.6 C)   TempSrc: Oral Oral Oral   SpO2: 96% 96%    Weight:      Height:        Intake/Output Summary (Last 24 hours) at 08/04/2023 0907 Last data filed at 08/04/2023 0852 Gross per 24 hour  Intake 500 ml  Output 700 ml  Net -200 ml      08/04/2023    1:40 AM 03/26/2023    4:56 PM 03/23/2023    1:21 PM  Last 3 Weights  Weight (lbs) 114 lb 1.6 oz 116 lb 13.5 oz 115 lb 15.4 oz  Weight (kg) 51.755 kg 53 kg 52.6 kg     Telemetry    SB 48-60s - Personally Reviewed  Physical Exam   GEN: No acute distress.  HEENT: Normocephalic, atraumatic, sclera non-icteric. Neck: No JVD or bruits. Cardiac: RRR no murmurs, rubs, or gallops.  Respiratory: Clear to auscultation bilaterally. Breathing is unlabored. GI: Soft, nontender, non-distended, BS +x 4. MS: no deformity. Extremities: No clubbing or cyanosis. No edema. Distal pedal pulses are 2+ and equal bilaterally. Neuro:  A+O to self only, not date or  place. Follows commands. Psych:  Responds to questions appropriately with a normal affect.  Labs    High Sensitivity Troponin:  No results for input(s): TROPONINIHS in the last 720 hours.    Cardiac EnzymesNo results for input(s): TROPONINI in the last 168 hours. No results for input(s): TROPIPOC in the last 168 hours.   Chemistry Recent Labs  Lab 08/03/23 1130 08/04/23 0236  NA 140 139  K 5.5* 4.5  CL 107 107  CO2 27 24  GLUCOSE 120* 105*  BUN 18 20  CREATININE 1.47* 1.52*  CALCIUM  8.9 8.9  PROT 7.2 6.3*  ALBUMIN 3.6 3.2*  AST 27 24  ALT 29 26  ALKPHOS 81 66  BILITOT 0.9 0.9  GFRNONAA 37* 36*  ANIONGAP 6 8     Hematology Recent Labs  Lab 08/03/23 1130 08/04/23 0236  WBC 10.9* 9.1  RBC 5.17* 4.33  HGB 11.9* 9.9*  HCT 40.4 33.4*  MCV 78.1* 77.1*  MCH 23.0* 22.9*  MCHC 29.5* 29.6*  RDW 14.6 14.6  PLT 181 174    BNPNo results for input(s): BNP, PROBNP in the last 168 hours.   DDimer No results for input(s): DDIMER in the last 168 hours.   Radiology    MR  BRAIN WO CONTRAST Result Date: 08/03/2023 CLINICAL DATA:  Provided history: Syncope/presyncope, cerebrovascular cause suspected. EXAM: MRI HEAD WITHOUT CONTRAST TECHNIQUE: Multiplanar, multiecho pulse sequences of the brain and surrounding structures were obtained without intravenous contrast. COMPARISON:  Head CT 08/03/2023. Brain MRI 08/05/2021. CTA head/neck 08/03/2021. FINDINGS: Brain: Mild generalized cerebral atrophy. Chronic infarcts within/about the bilateral deep gray nuclei, unchanged from the prior MRI of 08/05/2021. Chronic hemosiderin deposition associated with a chronic infarct in the right basal ganglia region. Moderate multifocal T2 FLAIR hyperintense signal abnormality elsewhere within the cerebral white matter, nonspecific but compatible chronic small vessel ischemic disease. Minor chronic small vessel ischemic changes also present within the pons. These findings are similar to the  prior MRI. Few chronic microhemorrhages within the thalami, likely reflecting sequela of chronic hypertensive microangiopathy. There is no acute infarct. No evidence of an intracranial mass. No extra-axial fluid collection. No midline shift. Vascular: High-grade stenoses were present within the intracranial right vertebral artery on the prior CTA head/neck of 08/03/2021. Flow voids preserved elsewhere within the proximal large arterial vessels. Skull and upper cervical spine: No focal worrisome marrow lesion. Sinuses/Orbits: No mass or acute finding within the imaged orbits. No significant paranasal sinus disease. IMPRESSION: 1. No evidence of an acute intracranial abnormality. 2. Parenchymal atrophy, chronic small vessel ischemic disease and known chronic infarcts, as described. 3. Few chronic microhemorrhages within the thalami, likely reflecting sequela of chronic hypertensive microangiopathy. 4. Please note, high-grade stenoses were demonstrated within the intracranial right vertebral artery on the prior CTA of 08/03/2021. Electronically Signed   By: Rockey Childs D.O.   On: 08/03/2023 19:14   DG Chest Portable 1 View Result Date: 08/03/2023 CLINICAL DATA:  Near syncope. EXAM: PORTABLE CHEST 1 VIEW COMPARISON:  Chest radiograph dated 03/23/2023. FINDINGS: The heart size and mediastinal contours are within normal limits. Both lungs are clear. The visualized skeletal structures are unremarkable. IMPRESSION: No active disease. Electronically Signed   By: Vanetta Chou M.D.   On: 08/03/2023 13:16   CT HEAD WO CONTRAST ( ) Result Date: 08/03/2023 CLINICAL DATA:  Provided history: Head trauma, abnormal mental status. Near syncope. EXAM: CT HEAD WITHOUT CONTRAST TECHNIQUE: Contiguous axial images were obtained from the base of the skull through the vertex without intravenous contrast. RADIATION DOSE REDUCTION: This exam was performed according to the departmental dose-optimization program which includes  automated exposure control, adjustment of the mA and/or kV according to patient size and/or use of iterative reconstruction technique. COMPARISON:  Head CT 03/23/2023. FINDINGS: Brain: Mild generalized cerebral atrophy. Known chronic infarcts within/about the bilateral basal ganglia. Moderate background cerebral white matter chronic small vessel ischemic disease, overall better appreciated on the prior MRI of 08/05/2021. There is no acute intracranial hemorrhage. No demarcated cortical infarct. No extra-axial fluid collection. No evidence of an intracranial mass. No midline shift. Vascular: No hyperdense vessel.  Atherosclerotic calcifications. Skull: No calvarial fracture or aggressive osseous lesion. Sinuses/Orbits: No mass or acute finding within the imaged orbits. No significant paranasal sinus disease at the imaged levels. IMPRESSION: 1.  No evidence of an acute intracranial abnormality. 2. Parenchymal atrophy, chronic small vessel ischemic disease and chronic lacunar infarcts, as described. Electronically Signed   By: Rockey Childs D.O.   On: 08/03/2023 12:35    Cardiac Studies   Pending echo  Patient Profile     78 y.o. 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 Poor historian due to dementia, history relayed by friend upon arrival.   Assessment & Plan    1. Syncope with associated hypotension, bradycardia, LBBB - last OV 2023 with HR 40s, so there is some chronicity to her bradycardia but certainly worse on admission - agree with cessation of metoprolol  - Note patient had ED visit 03/2023 with recurrent AF RVR in setting of fever with spontaneous reversion back to NSR so would benefit from EP f/u as outpatient if episodes recur - HR improved off metoprolol , will also hold Aricept  given theoretical risk of worsening bradycardia as well - brain  MRI as below - orthostatics this AM without significant drop in BP (aside from change in diastolic, unclear accuracy) - echo pending  2. PAF with history of stroke - on Eliquis  5mg  BID, will need f/u of anemia - Toprol  on hold due to #1  3. Progressive anemia, microcytic  - contribution to symptoms? Has hx of mild anemia per lab review, but more recently 11.5->11.9->9.9 - per primary team  4. HTN  - follow in context above, would be hesitant to aggressively treat  5. AKI on CKD stage 3 borderline a-b, hyperkalemia - prior Cr variable 1.1-1.5, here in the 1.5's range, suspect driven by #1 - K normalized without intervention  6. 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  For questions or updates, please contact [facility] Please consult [email or web address] for contact info under Cardiology/STEMI.  Signed, Raphael LOISE Bring, PA-C 08/04/2023, 9:07 AM    Personally seen and examined. Agree with above.  Here with her husband, [employer] [employer], married in 1991. She was brought back from [place].  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  Lives in [place] community around [place].  [facility], syncopal episode. Possible role with bradycardia, dehydration. Fairly advanced dementia.  Paroxysmal atrial fibrillation with tachycardia-bradycardia syndrome - During stroke had A-fib with RVR.  Hopefully this will not return. -We will stop her Aricept  as this can cause bradycardia.  Continue with Namenda  which does not affect heart rate.  Checking echo today.  Hopeful  discharge later today.  Oneil Parchment, MD

## 2023-08-04 NOTE — Progress Notes (Signed)
 Notified that patient went back into rapid AF 120s-140s (has hx of PAF with RVR), confirmed on EKG. She is asymptomatic but remains confused at baseline. BP is stable. Unfortunately, given her recent bradycardia upon admission and underlying conduction disease, options are limited. She is no longer on metoprolol  or Aricept . We need to be cautious in how we proceed. I discussed with Dr. Loni in detail - per her recommendation will start IV amiodarone  but hold off on additional AVN blocking agents. Would keep pacer pads on patient as a precautions. Sent update to rounding team in AM regarding events, may need EP evaluation this admission for tachy/brady.

## 2023-08-04 NOTE — Hospital Course (Addendum)
 Brief Narrative:   78 year old with history of atrial fibrillation, on Eliquis , HTN, CVA, HLD, dementia, hyperglycemia comes to the hospital with [facility]opal episode [facility].  Patient was noted to be bradycardic with heart rate in 38.  CT head unremarkable.  Eventually CT head, MRI brain were negative for any acute pathology.  TSH, orthostatics were normal.  Beta-blocker was discontinued, glucagon  was given.  Aricept  was discontinued. If echocardiogram unremarkable, possible discharge later today  Assessment & Plan:  Principal Problem:   Syncope and collapse   Syncope/collapse Symptomatic bradycardia Paroxysmal atrial fibrillation - Admitted to the hospital for further workup.  CT of the head is negative.  Initial concerns for possible beta-blocker toxicity therefore given glucagon  for reversal.  No obvious indication for temporary pacemaker at this time and just to monitor over next 24 hours.  Blood pressure has improved with IV fluid resuscitation. 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  P.o. beta-blocker on hold   DVT prophylaxis: apixaban  (ELIQUIS ) tablet 5 mg     Code Status: Full Code Family Communication: Significant other at bedside Status is: Inpatient Ongoing evaluation for syncope    Subjective:  Seen and examined at bedside. Had a long discussion with the patient, and her husband.  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 Overall patient is any complaints at this time  Examination:  General exam: Appears  calm and comfortable  Respiratory system: Clear to auscultation. Respiratory effort normal. Cardiovascular system: S1 & S2 heard, RRR. No JVD, murmurs, rubs, gallops or clicks. No pedal edema. Gastrointestinal system: Abdomen is nondistended, soft and nontender. No organomegaly or masses felt. Normal bowel sounds heard. Central nervous system: Alert and oriented. No focal neurological deficits. Extremities: Symmetric 5 x 5 power. Skin: No rashes, lesions or ulcers Psychiatry: Poor memory

## 2023-08-04 NOTE — Plan of Care (Signed)
  Problem: Education: Goal: Knowledge of condition and prescribed therapy will improve Outcome: Progressing   Problem: Cardiac: Goal: Will achieve and/or maintain adequate cardiac output Outcome: Progressing   Problem: Physical Regulation: Goal: Complications related to the disease process, condition or treatment will be avoided or minimized Outcome: Progressing   

## 2023-08-04 NOTE — Progress Notes (Signed)
 PROGRESS NOTE    Sherry Fitzgerald  FMW:[id number] DOB: 1945/10/27 DOA: 08/03/2023 PCP: Yolande Toribio MATSU, MD    Brief Narrative:   78 year old with history of atrial fibrillation, on Eliquis , HTN, CVA, HLD, dementia, hyperglycemia comes to the hospital with syncopal episode [facility] the spine center.  Patient was noted to be bradycardic with heart rate in 38.  CT head unremarkable.  Eventually CT head, MRI brain were negative for any acute pathology.  TSH, orthostatics were normal.  Beta-blocker was discontinued, glucagon  was given.  Aricept  was discontinued. If echocardiogram unremarkable, possible discharge later today  Assessment & Plan:  Principal Problem:   Syncope and collapse   Syncope/collapse Symptomatic bradycardia Paroxysmal atrial fibrillation - Admitted to the hospital for further workup.  CT of the head is negative.  Initial concerns for possible beta-blocker toxicity therefore given glucagon  for reversal.  No obvious indication for temporary pacemaker at this time and just to monitor over next 24 hours.  Blood pressure has improved with IV fluid resuscitation. 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  P.o. beta-blocker on hold   DVT prophylaxis: apixaban  (ELIQUIS ) tablet 5 mg     Code Status: Full Code Family Communication: Significant other at bedside Status is: Inpatient Ongoing evaluation for syncope    Subjective:  Seen and examined at bedside. Had a long discussion with the patient, and her husband.  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 Overall patient is any complaints at this time  Examination:  General exam: Appears calm and comfortable  Respiratory system: Clear to auscultation. Respiratory effort normal. Cardiovascular system: S1 & S2 heard, RRR. No JVD, murmurs, rubs, gallops or clicks. No pedal edema. Gastrointestinal system: Abdomen is nondistended, soft and nontender. No organomegaly or masses felt. Normal bowel sounds heard. Central nervous system: Alert and oriented. No focal neurological deficits. Extremities: Symmetric 5 x 5 power. Skin: No rashes, lesions or ulcers Psychiatry: Poor memory                Diet Orders (From admission, onward)     Start     Ordered   08/03/23 1353  Diet Heart Room service appropriate? Yes; Fluid consistency: Thin  Diet effective now       Question Answer Comment  Room service appropriate? Yes   Fluid consistency: Thin      08/03/23 1411            Objective: Vitals:   08/04/23 0153 08/04/23 0410 08/04/23 0724 08/04/23 0855  BP: (!) 151/53 (!) 131/51 (!) 153/60 121/63  Pulse: (!) 46 (!) 43    Resp: 11 12 11 13   Temp: 97.7 F (36.5 C) 97.9 F (36.6 C) 97.9 F (36.6 C)   TempSrc: Oral Oral Oral   SpO2: 96% 96%    Weight:      Height:        Intake/Output Summary (Last 24 hours) at 08/04/2023 1141 Last data filed at 08/04/2023 0900 Gross per 24 hour  Intake 858 ml  Output 700 ml  Net 158 ml   Filed Weights   08/04/23 0140  Weight: 51.8 kg  Scheduled Meds:  apixaban   5 mg Oral Q12H   atorvastatin   80 mg Oral Daily   ezetimibe   10 mg Oral Daily   glucagon  (human recombinant)  2 mg Intravenous Once   memantine   28 mg Oral Daily   sodium chloride  flush  3 mL Intravenous Q12H   Continuous Infusions:  Nutritional status     Body mass index is 25.58 kg/m.  Data Reviewed:   CBC: Recent Labs  Lab 08/03/23 1130 08/04/23 0236  WBC 10.9* 9.1  NEUTROABS 8.9*  --   HGB 11.9* 9.9*  HCT 40.4  33.4*  MCV 78.1* 77.1*  PLT 181 174   Basic Metabolic Panel: Recent Labs  Lab 08/03/23 1130 08/04/23 0236  NA 140 139  K 5.5* 4.5  CL 107 107  CO2 27 24  GLUCOSE 120* 105*  BUN 18 20  CREATININE 1.47* 1.52*  CALCIUM  8.9 8.9  MG  --  2.1   GFR: Estimated Creatinine Clearance: 21.5 mL/min (A) (by C-G formula based on SCr of 1.52 mg/dL (H)). Liver Function Tests: Recent Labs  Lab 08/03/23 1130 08/04/23 0236  AST 27 24  ALT 29 26  ALKPHOS 81 66  BILITOT 0.9 0.9  PROT 7.2 6.3*  ALBUMIN 3.6 3.2*   No results for input(s): LIPASE, AMYLASE in the last 168 hours. No results for input(s): AMMONIA in the last 168 hours. Coagulation Profile: Recent Labs  Lab 08/03/23 1130  INR 1.4*   Cardiac Enzymes: Recent Labs  Lab 08/04/23 0236  CKTOTAL 94   BNP (last 3 results) No results for input(s): PROBNP in the last 8760 hours. HbA1C: No results for input(s): HGBA1C in the last 72 hours. CBG: Recent Labs  Lab 08/04/23 0610  GLUCAP 92   Lipid Profile: No results for input(s): CHOL, HDL, LDLCALC, TRIG, CHOLHDL, LDLDIRECT in the last 72 hours. Thyroid  Function Tests: Recent Labs    08/04/23 0236  TSH 2.013  FREET4 0.79   Anemia Panel: Recent Labs    08/04/23 0928  VITAMINB12 237  FERRITIN 68  TIBC 287  IRON 40   Sepsis Labs: No results for input(s): PROCALCITON, LATICACIDVEN in the last 168 hours.  No results found for this or any previous visit (from the past 240 hours).       Radiology Studies: MR BRAIN WO CONTRAST Result Date: 08/03/2023 CLINICAL DATA:  Provided history: Syncope/presyncope, cerebrovascular cause suspected. EXAM: MRI HEAD WITHOUT CONTRAST TECHNIQUE: Multiplanar, multiecho pulse sequences of the brain and surrounding structures were obtained without intravenous contrast. COMPARISON:  Head CT 08/03/2023. Brain MRI 08/05/2021. CTA head/neck 08/03/2021. FINDINGS: Brain: Mild generalized cerebral atrophy. Chronic  infarcts within/about the bilateral deep gray nuclei, unchanged from the prior MRI of 08/05/2021. Chronic hemosiderin deposition associated with a chronic infarct in the right basal ganglia region. Moderate multifocal T2 FLAIR hyperintense signal abnormality elsewhere within the cerebral white matter, nonspecific but compatible chronic small vessel ischemic disease. Minor chronic small vessel ischemic changes also present within the pons. These findings are similar to the prior MRI. Few chronic microhemorrhages within the thalami, likely reflecting sequela of chronic hypertensive microangiopathy. There is no acute infarct. No evidence of an intracranial mass. No extra-axial fluid collection. No midline shift. Vascular: High-grade stenoses were present within the intracranial right vertebral artery on the prior CTA head/neck of 08/03/2021. Flow voids preserved elsewhere within the proximal large arterial vessels. Skull and upper cervical spine: No focal worrisome marrow lesion. Sinuses/Orbits: No mass or acute finding within the imaged orbits. No  significant paranasal sinus disease. IMPRESSION: 1. No evidence of an acute intracranial abnormality. 2. Parenchymal atrophy, chronic small vessel ischemic disease and known chronic infarcts, as described. 3. Few chronic microhemorrhages within the thalami, likely reflecting sequela of chronic hypertensive microangiopathy. 4. Please note, high-grade stenoses were demonstrated within the intracranial right vertebral artery on the prior CTA of 08/03/2021. Electronically Signed   By: Rockey Childs D.O.   On: 08/03/2023 19:14   DG Chest Portable 1 View Result Date: 08/03/2023 CLINICAL DATA:  Near syncope. EXAM: PORTABLE CHEST 1 VIEW COMPARISON:  Chest radiograph dated 03/23/2023. FINDINGS: The heart size and mediastinal contours are within normal limits. Both lungs are clear. The visualized skeletal structures are unremarkable. IMPRESSION: No active disease. Electronically  Signed   By: Vanetta Chou M.D.   On: 08/03/2023 13:16   CT HEAD WO CONTRAST ( ) Result Date: 08/03/2023 CLINICAL DATA:  Provided history: Head trauma, abnormal mental status. Near syncope. EXAM: CT HEAD WITHOUT CONTRAST TECHNIQUE: Contiguous axial images were obtained from the base of the skull through the vertex without intravenous contrast. RADIATION DOSE REDUCTION: This exam was performed according to the departmental dose-optimization program which includes automated exposure control, adjustment of the mA and/or kV according to patient size and/or use of iterative reconstruction technique. COMPARISON:  Head CT 03/23/2023. FINDINGS: Brain: Mild generalized cerebral atrophy. Known chronic infarcts within/about the bilateral basal ganglia. Moderate background cerebral white matter chronic small vessel ischemic disease, overall better appreciated on the prior MRI of 08/05/2021. There is no acute intracranial hemorrhage. No demarcated cortical infarct. No extra-axial fluid collection. No evidence of an intracranial mass. No midline shift. Vascular: No hyperdense vessel.  Atherosclerotic calcifications. Skull: No calvarial fracture or aggressive osseous lesion. Sinuses/Orbits: No mass or acute finding within the imaged orbits. No significant paranasal sinus disease at the imaged levels. IMPRESSION: 1.  No evidence of an acute intracranial abnormality. 2. Parenchymal atrophy, chronic small vessel ischemic disease and chronic lacunar infarcts, as described. Electronically Signed   By: Rockey Childs D.O.   On: 08/03/2023 12:35           LOS: 1 day   Time spent= 35 mins    Burgess JAYSON Dare, MD [facility]  If 7PM-7AM, please contact night-coverage  08/04/2023, 11:41 AM

## 2023-08-04 NOTE — Progress Notes (Signed)
 Responded to consult for IV. On arrival, nurse reported IV obtained. Consult cleared.

## 2023-08-05 DIAGNOSIS — R55 Syncope and collapse: Secondary | ICD-10-CM | POA: Diagnosis not present

## 2023-08-05 LAB — BASIC METABOLIC PANEL WITH GFR
Anion gap: 7 (ref 5–15)
BUN: 12 mg/dL (ref 8–23)
CO2: 27 mmol/L (ref 22–32)
Calcium: 8.7 mg/dL — ABNORMAL LOW (ref 8.9–10.3)
Chloride: 106 mmol/L (ref 98–111)
Creatinine, Ser: 1.45 mg/dL — ABNORMAL HIGH (ref 0.44–1.00)
GFR, Estimated: 38 mL/min — ABNORMAL LOW (ref >60)
Glucose, Bld: 124 mg/dL — ABNORMAL HIGH (ref 70–99)
Potassium: 4.7 mmol/L (ref 3.5–5.1)
Sodium: 140 mmol/L (ref 135–145)

## 2023-08-05 LAB — MAGNESIUM: Magnesium: 1.8 mg/dL (ref 1.7–2.4)

## 2023-08-05 LAB — CBC
HCT: 36.3 % (ref 36.0–46.0)
Hemoglobin: 10.8 g/dL — ABNORMAL LOW (ref 12.0–15.0)
MCH: 22.8 pg — ABNORMAL LOW (ref 26.0–34.0)
MCHC: 29.8 g/dL — ABNORMAL LOW (ref 30.0–36.0)
MCV: 76.6 fL — ABNORMAL LOW (ref 80.0–100.0)
Platelets: 179 K/uL (ref 150–400)
RBC: 4.74 MIL/uL (ref 3.87–5.11)
RDW: 14.6 % (ref 11.5–15.5)
WBC: 5.6 K/uL (ref 4.0–10.5)
nRBC: 0 % (ref 0.0–0.2)

## 2023-08-05 LAB — PHOSPHORUS: Phosphorus: 3.3 mg/dL (ref 2.5–4.6)

## 2023-08-05 LAB — GLUCOSE, CAPILLARY: Glucose-Capillary: 121 mg/dL — ABNORMAL HIGH (ref 70–99)

## 2023-08-05 MED ORDER — AMIODARONE HCL 200 MG PO TABS: 200.0000 mg | ORAL_TABLET | Freq: Every day | ORAL | 0 refills | Status: DC

## 2023-08-05 MED ORDER — AMIODARONE HCL 200 MG PO TABS
200.0000 mg | ORAL_TABLET | Freq: Every day | ORAL | Status: DC
  Administered 2023-08-05: 200 mg via ORAL
  Filled 2023-08-05: qty 1

## 2023-08-05 NOTE — Discharge Summary (Signed)
 Physician Discharge Summary  Sherry Fitzgerald FMW:[id number] DOB: Jan 29, 1945 DOA: 08/03/2023  PCP: Yolande Toribio MATSU, MD  Admit date: 08/03/2023 Discharge date: 08/05/2023  Admitted From: Home Disposition: Home  Recommendations for Outpatient Follow-up:  Follow up with PCP in 1-2 weeks Please obtain BMP/CBC in one week your next doctors visit.  Discontinue beta-blocker metoprolol  and Aricept  Started amiodarone  200 mg orally daily Outpatient follow-up with cardiology  Discharge Condition: Stable CODE STATUS: Full code Diet recommendation: Heart healthy  Brief/Interim Summary: Brief Narrative:   78 year old with history of atrial fibrillation, on Eliquis , HTN, CVA, HLD, dementia, hyperglycemia comes to the hospital with [facility].  Patient was noted to be bradycardic with heart rate in 38.  CT head unremarkable.  Eventually CT head, MRI brain were negative for any acute pathology.  TSH, orthostatics were normal.  Beta-blocker was discontinued, glucagon  was given.  Aricept  was discontinued. Echocardiogram showing preserved ejection fraction.  Noted patient went into atrial fibrillation with RVR and was started on amiodarone  drip.  Briefly.  In the morning converted to normal sinus rhythm and cardiology recommended p.o. amiodarone . Medically stable for discharge today.  Assessment & Plan:  Principal Problem:   Syncope and collapse   Syncope/collapse Symptomatic bradycardia Atrial fibrillation with RVR - Initial bradycardia concerns of secondary to beta-blocker or Aricept , glucagon  was given for reversal.  No pacemaker was required.  Over the next 24 hours heart rate did improve but went into atrial fibrillation with RVR, started on amiodarone  drip for very brief period.  Now on p.o. amiodarone  upon discharge. - MRI brain-negative, chronic infarct, chronic hypertensive microangiopathy, prior known high-grade stenosis of right intracranial vertebral  artery - TSH-normal - Orthostatics unremarkable - Echocardiogram-showing preserved EF. - See by cardiology, considering EP evaluation. - Beta-blockers discontinued for now - Continue Eliquis   History of CVA - Continue Eliquis  and Lipitor   Hyperlipidemia - Lipitor   Dementia - Continue Namenda , stop Aricept   Essential hypertension - IV as needed.  P.o. beta-blocker on hold   DVT prophylaxis: apixaban  (ELIQUIS ) tablet 5 mg     Code Status: Full Code Family Communication: Significant other at bedside     Subjective:  Yesterday evening patient went into atrial fibrillation with RVR but this morning heart rate again in 50s Doing very well wishing to go home  Examination:  General exam: Appears calm and comfortable  Respiratory system: Clear to auscultation. Respiratory effort normal. Cardiovascular system: S1 & S2 heard, RRR. No JVD, murmurs, rubs, gallops or clicks. No pedal edema. Gastrointestinal system: Abdomen is nondistended, soft and nontender. No organomegaly or masses felt. Normal bowel sounds heard. Central nervous system: Alert and oriented. No focal neurological deficits. Extremities: Symmetric 5 x 5 power. Skin: No rashes, lesions or ulcers Psychiatry: Poor memory    Discharge Diagnoses:  Principal Problem:   Syncope and collapse      Discharge Exam: Vitals:   08/05/23 0356 08/05/23 0727  BP: (!) 134/54 (!) 145/74  Pulse: (!) 47   Resp: 15 19  Temp: 97.8 F (36.6 C) 97.8 F (36.6 C)  SpO2: 96%    Vitals:   08/05/23 0021 08/05/23 0356 08/05/23 0533 08/05/23 0727  BP: (!) 155/49 (!) 134/54  (!) 145/74  Pulse: 61 (!) 47    Resp: 14 15  19   Temp: 98.5 F (36.9 C) 97.8 F (36.6 C)  97.8 F (36.6 C)  TempSrc: Oral Oral  Oral  SpO2: 96% 96%    Weight:   49.7  kg   Height:   4' 8 (1.422 m)       Discharge Instructions   Allergies as of 08/05/2023   No Known Allergies      Medication List     STOP taking these medications     donepezil  5 MG tablet Commonly known as: ARICEPT    metoprolol  tartrate 25 MG tablet Commonly known as: LOPRESSOR        TAKE these medications    amiodarone  200 MG tablet Commonly known as: PACERONE  Take 1 tablet (200 mg total) by mouth daily.   apixaban  5 MG Tabs tablet Commonly known as: ELIQUIS  Take 1 tablet (5 mg total) by mouth 2 (two) times daily.   atorvastatin  80 MG tablet Commonly known as: LIPITOR  Take 1 tablet (80 mg total) by mouth daily.   ezetimibe  10 MG tablet Commonly known as: ZETIA  Take 10 mg by mouth daily.   losartan  100 MG tablet Commonly known as: COZAAR  Take 1 tablet (100 mg total) by mouth daily.   memantine  28 MG Cp24 24 hr capsule Commonly known as: NAMENDA  XR TAKE 1 CAPSULE BY MOUTH EVERY DAY        Follow-up Information     Emelia Josefa HERO, NP Follow up.   Specialty: Cardiology Why: [facility] - [address] location - cardiology follow-up with nurse practitioner Josefa Emelia on Thursday Aug 26, 2023 at 2:20 PM (Arrive by 2:00 PM) Contact information: [address] [place] [address] [phone]         Yolande Toribio MATSU, MD Follow up in 1 week(s).   Specialty: Internal Medicine Contact information: [address] [place] [address] [phone]                No Known Allergies  You were cared for by a hospitalist during your hospital stay. If you have any questions about your discharge medications or the care you received while you were in the hospital after you are discharged, you can call the unit and asked to speak with the hospitalist on call if the hospitalist that took care of you is not available. Once you are discharged, your primary care physician will handle any further medical issues. Please note that no refills for any discharge medications will be authorized once you are discharged, as it is imperative that you return to your primary care physician (or establish a relationship  with a primary care physician if you do not have one) for your aftercare needs so that they can reassess your need for medications and monitor your lab values.  You were cared for by a hospitalist during your hospital stay. If you have any questions about your discharge medications or the care you received while you were in the hospital after you are discharged, you can call the unit and asked to speak with the hospitalist on call if the hospitalist that took care of you is not available. Once you are discharged, your primary care physician will handle any further medical issues. Please note that NO REFILLS for any discharge medications will be authorized once you are discharged, as it is imperative that you return to your primary care physician (or establish a relationship with a primary care physician if you do not have one) for your aftercare needs so that they can reassess your need for medications and monitor your lab values.  Please request your Prim.MD to go over all Hospital Tests and Procedure/Radiological results at the follow up, please get all Hospital records sent to  your Prim MD by signing hospital release before you go home.  Get CBC, CMP, 2 view Chest X ray checked  by Primary MD during your next visit or SNF MD in 5-7 days ( we routinely change or add medications that can affect your baseline labs and fluid status, therefore we recommend that you get the mentioned basic workup next visit with your PCP, your PCP may decide not to get them or add new tests based on their clinical decision)  On your next visit with your primary care physician please Get Medicines reviewed and adjusted.  If you experience worsening of your admission symptoms, develop shortness of breath, life threatening emergency, suicidal or homicidal thoughts you must seek medical attention immediately by calling 911 or calling your MD immediately  if symptoms less severe.  You Must read complete instructions/literature  along with all the possible adverse reactions/side effects for all the Medicines you take and that have been prescribed to you. Take any new Medicines after you have completely understood and accpet all the possible adverse reactions/side effects.   Do not drive, operate heavy machinery, perform activities at heights, swimming or participation in water activities or provide baby sitting services if your were admitted for syncope or siezures until you have seen by Primary MD or a Neurologist and advised to do so again.  Do not drive when taking Pain medications.   Procedures/Studies: ECHOCARDIOGRAM COMPLETE Result Date: 08/04/2023    ECHOCARDIOGRAM REPORT   Patient Name:   Pyper Olexa Date of Exam: 08/04/2023 Medical Rec #:  [id number]       Height:       56.0 in Accession #:    [id number]      Weight:       114.1 lb Date of Birth:  July 12, 1945      BSA:          1.398 m Patient Age:    77 years        BP:           153/60 mmHg Patient Gender: F               HR:           47 bpm. Exam Location:  Inpatient Procedure: 2D Echo, Cardiac Doppler and Color Doppler (Both Spectral and Color            Flow Doppler were utilized during procedure). Indications:    Syncope  History:        Patient has prior history of Echocardiogram examinations.                 Signs/Symptoms:Syncope; Risk Factors:Hypertension.  Sonographer:    Vella Key Referring Phys: MARIO GAILS PATEL IMPRESSIONS  1. Left ventricular ejection fraction, by estimation, is 60 to 65%. The left ventricle has normal function. The left ventricle has no regional wall motion abnormalities. Left ventricular diastolic parameters were normal.  2. Right ventricular systolic function is normal. The right ventricular size is normal.  3. Left atrial size was mildly dilated.  4. Right atrial size was moderately dilated.  5. The mitral valve is normal in structure. No evidence of mitral valve regurgitation. No evidence of mitral stenosis.  6. The aortic valve is  normal in structure. There is moderate calcification of the aortic valve. Aortic valve regurgitation is not visualized. Aortic valve sclerosis/calcification is present, without any evidence of aortic stenosis.  7. The inferior vena cava is normal in size with greater  than 50% respiratory variability, suggesting right atrial pressure of 3 mmHg. FINDINGS  Left Ventricle: Left ventricular ejection fraction, by estimation, is 60 to 65%. The left ventricle has normal function. The left ventricle has no regional wall motion abnormalities. The left ventricular internal cavity size was normal in size. There is  no left ventricular hypertrophy. Left ventricular diastolic parameters were normal. Right Ventricle: The right ventricular size is normal. No increase in right ventricular wall thickness. Right ventricular systolic function is normal. Left Atrium: Left atrial size was mildly dilated. Right Atrium: Right atrial size was moderately dilated. Pericardium: There is no evidence of pericardial effusion. Mitral Valve: The mitral valve is normal in structure. No evidence of mitral valve regurgitation. No evidence of mitral valve stenosis. Tricuspid Valve: The tricuspid valve is normal in structure. Tricuspid valve regurgitation is mild . No evidence of tricuspid stenosis. Aortic Valve: The aortic valve is normal in structure. There is moderate calcification of the aortic valve. Aortic valve regurgitation is not visualized. Aortic valve sclerosis/calcification is present, without any evidence of aortic stenosis. Pulmonic Valve: The pulmonic valve was normal in structure. Pulmonic valve regurgitation is not visualized. No evidence of pulmonic stenosis. Aorta: The aortic root is normal in size and structure. Venous: The inferior vena cava is normal in size with greater than 50% respiratory variability, suggesting right atrial pressure of 3 mmHg. IAS/Shunts: No atrial level shunt detected by color flow Doppler.  LEFT VENTRICLE  PLAX 2D LVIDd:         3.50 cm     Diastology LVIDs:         2.00 cm     LV e' medial:    8.36 cm/s LV PW:         1.20 cm     LV E/e' medial:  9.5 LV IVS:        1.20 cm     LV e' lateral:   9.86 cm/s LVOT diam:     1.30 cm     LV E/e' lateral: 8.0 LV SV:         40 LV SV Index:   28 LVOT Area:     1.33 cm  LV Volumes (MOD) LV vol d, MOD A2C: 67.7 ml LV vol d, MOD A4C: 58.8 ml LV vol s, MOD A2C: 17.4 ml LV vol s, MOD A4C: 19.6 ml LV SV MOD A2C:     50.3 ml LV SV MOD A4C:     58.8 ml LV SV MOD BP:      45.4 ml RIGHT VENTRICLE RV Basal diam:  3.30 cm RV S prime:     19.90 cm/s TAPSE (M-mode): 3.0 cm LEFT ATRIUM             Index        RIGHT ATRIUM           Index LA diam:        3.30 cm 2.36 cm/m   RA Area:     21.20 cm LA Vol (A2C):   56.2 ml 40.19 ml/m  RA Volume:   61.70 ml  44.12 ml/m LA Vol (A4C):   39.2 ml 28.03 ml/m LA Biplane Vol: 46.8 ml 33.47 ml/m  AORTIC VALVE LVOT Vmax:   118.00 cm/s LVOT Vmean:  88.400 cm/s LVOT VTI:    0.298 m  AORTA Ao Root diam: 2.90 cm MITRAL VALVE                TRICUSPID VALVE MV Area (  PHT): 2.54 cm     TV Peak grad:   22.7 mmHg MV Decel Time: 299 msec     TV Vmax:        2.38 m/s MV E velocity: 79.10 cm/s MV A velocity: 107.00 cm/s  SHUNTS MV E/A ratio:  0.74         Systemic VTI:  0.30 m                             Systemic Diam: 1.30 cm Morene Brownie Electronically signed by Morene Brownie Signature Date/Time: 08/04/2023/6:26:55 PM    Final    MR BRAIN WO CONTRAST Result Date: 08/03/2023 CLINICAL DATA:  Provided history: Syncope/presyncope, cerebrovascular cause suspected. EXAM: MRI HEAD WITHOUT CONTRAST TECHNIQUE: Multiplanar, multiecho pulse sequences of the brain and surrounding structures were obtained without intravenous contrast. COMPARISON:  Head CT 08/03/2023. Brain MRI 08/05/2021. CTA head/neck 08/03/2021. FINDINGS: Brain: Mild generalized cerebral atrophy. Chronic infarcts within/about the bilateral deep gray nuclei, unchanged from the prior MRI of  08/05/2021. Chronic hemosiderin deposition associated with a chronic infarct in the right basal ganglia region. Moderate multifocal T2 FLAIR hyperintense signal abnormality elsewhere within the cerebral white matter, nonspecific but compatible chronic small vessel ischemic disease. Minor chronic small vessel ischemic changes also present within the pons. These findings are similar to the prior MRI. Few chronic microhemorrhages within the thalami, likely reflecting sequela of chronic hypertensive microangiopathy. There is no acute infarct. No evidence of an intracranial mass. No extra-axial fluid collection. No midline shift. Vascular: High-grade stenoses were present within the intracranial right vertebral artery on the prior CTA head/neck of 08/03/2021. Flow voids preserved elsewhere within the proximal large arterial vessels. Skull and upper cervical spine: No focal worrisome marrow lesion. Sinuses/Orbits: No mass or acute finding within the imaged orbits. No significant paranasal sinus disease. IMPRESSION: 1. No evidence of an acute intracranial abnormality. 2. Parenchymal atrophy, chronic small vessel ischemic disease and known chronic infarcts, as described. 3. Few chronic microhemorrhages within the thalami, likely reflecting sequela of chronic hypertensive microangiopathy. 4. Please note, high-grade stenoses were demonstrated within the intracranial right vertebral artery on the prior CTA of 08/03/2021. Electronically Signed   By: Rockey Childs D.O.   On: 08/03/2023 19:14   DG Chest Portable 1 View Result Date: 08/03/2023 CLINICAL DATA:  Near syncope. EXAM: PORTABLE CHEST 1 VIEW COMPARISON:  Chest radiograph dated 03/23/2023. FINDINGS: The heart size and mediastinal contours are within normal limits. Both lungs are clear. The visualized skeletal structures are unremarkable. IMPRESSION: No active disease. Electronically Signed   By: Vanetta Chou M.D.   On: 08/03/2023 13:16   CT HEAD WO CONTRAST  ( ) Result Date: 08/03/2023 CLINICAL DATA:  Provided history: Head trauma, abnormal mental status. Near syncope. EXAM: CT HEAD WITHOUT CONTRAST TECHNIQUE: Contiguous axial images were obtained from the base of the skull through the vertex without intravenous contrast. RADIATION DOSE REDUCTION: This exam was performed according to the departmental dose-optimization program which includes automated exposure control, adjustment of the mA and/or kV according to patient size and/or use of iterative reconstruction technique. COMPARISON:  Head CT 03/23/2023. FINDINGS: Brain: Mild generalized cerebral atrophy. Known chronic infarcts within/about the bilateral basal ganglia. Moderate background cerebral white matter chronic small vessel ischemic disease, overall better appreciated on the prior MRI of 08/05/2021. There is no acute intracranial hemorrhage. No demarcated cortical infarct. No extra-axial fluid collection. No evidence of an intracranial mass. No midline shift. Vascular: No  hyperdense vessel.  Atherosclerotic calcifications. Skull: No calvarial fracture or aggressive osseous lesion. Sinuses/Orbits: No mass or acute finding within the imaged orbits. No significant paranasal sinus disease at the imaged levels. IMPRESSION: 1.  No evidence of an acute intracranial abnormality. 2. Parenchymal atrophy, chronic small vessel ischemic disease and chronic lacunar infarcts, as described. Electronically Signed   By: Rockey Childs D.O.   On: 08/03/2023 12:35     The results of significant diagnostics from this hospitalization (including imaging, microbiology, ancillary and laboratory) are listed below for reference.     Microbiology: No results found for this or any previous visit (from the past 240 hours).   Labs: BNP (last 3 results) Recent Labs    03/23/23 1457  BNP 116.8*   Basic Metabolic Panel: Recent Labs  Lab 08/03/23 1130 08/04/23 0236 08/05/23 0233  NA 140 139 140  K 5.5* 4.5 4.7  CL 107  107 106  CO2 27 24 27   GLUCOSE 120* 105* 124*  BUN 18 20 12   CREATININE 1.47* 1.52* 1.45*  CALCIUM  8.9 8.9 8.7*  MG  --  2.1 1.8  PHOS  --   --  3.3   Liver Function Tests: Recent Labs  Lab 08/03/23 1130 08/04/23 0236  AST 27 24  ALT 29 26  ALKPHOS 81 66  BILITOT 0.9 0.9  PROT 7.2 6.3*  ALBUMIN 3.6 3.2*   No results for input(s): LIPASE, AMYLASE in the last 168 hours. No results for input(s): AMMONIA in the last 168 hours. CBC: Recent Labs  Lab 08/03/23 1130 08/04/23 0236 08/05/23 0233  WBC 10.9* 9.1 5.6  NEUTROABS 8.9*  --   --   HGB 11.9* 9.9* 10.8*  HCT 40.4 33.4* 36.3  MCV 78.1* 77.1* 76.6*  PLT 181 174 179   Cardiac Enzymes: Recent Labs  Lab 08/04/23 0236  CKTOTAL 94   BNP: Invalid input(s): POCBNP CBG: Recent Labs  Lab 08/04/23 0610 08/05/23 0517  GLUCAP 92 121*   D-Dimer No results for input(s): DDIMER in the last 72 hours. Hgb A1c No results for input(s): HGBA1C in the last 72 hours. Lipid Profile No results for input(s): CHOL, HDL, LDLCALC, TRIG, CHOLHDL, LDLDIRECT in the last 72 hours. Thyroid  function studies Recent Labs    08/04/23 0236  TSH 2.013   Anemia work up Recent Labs    08/04/23 0928  VITAMINB12 237  FERRITIN 68  TIBC 287  IRON 40   Urinalysis    Component Value Date/Time   COLORURINE YELLOW 03/23/2023 1443   APPEARANCEUR CLEAR 03/23/2023 1443   LABSPEC 1.022 03/23/2023 1443   PHURINE 5.0 03/23/2023 1443   GLUCOSEU NEGATIVE 03/23/2023 1443   HGBUR NEGATIVE 03/23/2023 1443   BILIRUBINUR NEGATIVE 03/23/2023 1443   KETONESUR NEGATIVE 03/23/2023 1443   PROTEINUR TRACE (A) 03/23/2023 1443   NITRITE NEGATIVE 03/23/2023 1443   LEUKOCYTESUR SMALL (A) 03/23/2023 1443   Sepsis Labs Recent Labs  Lab 08/03/23 1130 08/04/23 0236 08/05/23 0233  WBC 10.9* 9.1 5.6   Microbiology No results found for this or any previous visit (from the past 240 hours).   Time coordinating discharge:  I  have spent 35 minutes face to face with the patient and on the ward discussing the patients care, assessment, plan and disposition with other care givers. >50% of the time was devoted counseling the patient about the risks and benefits of treatment/Discharge disposition and coordinating care.   SIGNED:   Burgess JAYSON Dare, MD  [facility] 08/05/2023, 11:54 AM  If 7PM-7AM, please contact night-coverage

## 2023-08-05 NOTE — Progress Notes (Signed)
  Progress Note  Patient Name: Sherry Fitzgerald Date of Encounter: 08/05/2023 [facility] Cardiologist: Vinie JAYSON Maxcy, MD   Interval Summary   Brief AFIB RVR episode yesterday. Now comfortable. No CP  Vital Signs Vitals:   08/05/23 0021 08/05/23 0356 08/05/23 0533 08/05/23 0727  BP: (!) 155/49 (!) 134/54  (!) 145/74  Pulse: 61 (!) 47    Resp: 14 15  19   Temp: 98.5 F (36.9 C) 97.8 F (36.6 C)  97.8 F (36.6 C)  TempSrc: Oral Oral  Oral  SpO2: 96% 96%    Weight:   49.7 kg   Height:   4' 8 (1.422 m)     Intake/Output Summary (Last 24 hours) at 08/05/2023 0920 Last data filed at 08/05/2023 0243 Gross per 24 hour  Intake 1179.48 ml  Output 1300 ml  Net -120.52 ml      08/05/2023    5:33 AM 08/04/2023    1:40 AM 03/26/2023    4:56 PM  Last 3 Weights  Weight (lbs) 109 lb 9.1 oz 114 lb 1.6 oz 116 lb 13.5 oz  Weight (kg) 49.7 kg 51.755 kg 53 kg      Telemetry/ECG  Approximately 2 hours of atrial fibrillation with rapid ventricular spots from 7 to 9 PM.  IV amiodarone  utilized.- Personally Reviewed  Physical Exam  GEN: No acute distress.   Neck: No JVD Cardiac: Bradycardic regular, no murmurs, rubs, or gallops.  Respiratory: Clear to auscultation bilaterally. GI: Soft, nontender, non-distended  MS: No edema  Assessment & Plan   Paroxysmal atrial fibrillation with rapid ventricular response-last night episode of occurred.  Placed on IV amiodarone .  Need to watch for signs of worsening bradycardia.  She had bradycardia in the 40s with left bundle branch block on admission.  Had a [facility]opal [facility] the science center.  Could have been dehydration.  She has fairly advanced dementia.  We are trying to avoid any invasive procedures.  We stopped Aricept  yesterday.  It may not be unreasonable to utilize amiodarone  as long as no significant bradycardia occurs i.e. in the 20s or 30s.    Currently heart rates in the upper 50s.  Excellent.  I will transition her over to  p.o. amiodarone .  Echocardiogram with normal pump function EF 65%.  Okay for discharge.  Spoke with her husband.    For questions or updates, please contact [facility] Please consult [email or web address] for contact info under       Signed, Oneil Parchment, MD

## 2023-08-05 NOTE — TOC CM/SW Note (Signed)
 Transition of Care [facility]) - Inpatient Brief Assessment   Patient Details  Name: Sherry Fitzgerald MRN: [id number] Date of Birth: Mar 11, 1945  Transition of [facility] CM/SW Contact:    Waddell Barnie Rama, RN Phone Number: 08/05/2023, 10:17 AM   Clinical Narrative: From home with spouse, has PCP, Mabel with [facility] and insurance on file, states has no HH services in place at this time or DME at home.  States family member will transport them home at [employer] and family is support system, states gets medications from [facility] on [address].  Pta self ambulatory.   Patient gives this NCM to speak with spouse.     Transition of Care Asessment: Insurance and Status: Insurance coverage has been reviewed Patient has primary care physician: Yes Home environment has been reviewed: home with spouse, who is the caretaker Prior level of function:: indep Prior/Current Home Services: No current home services Social Drivers of Health Review: SDOH reviewed no interventions necessary Readmission risk has been reviewed: Yes Transition of care needs: no transition of care needs at this time

## 2023-08-05 NOTE — TOC Transition Note (Signed)
 Transition of Care [facility]) - Discharge Note   Patient Details  Name: Sherry Fitzgerald MRN: [id number] Date of Birth: 1945-03-07  Transition of Care [facility]) CM/SW Contact:  Waddell Barnie Rama, RN Phone Number: 08/05/2023, 10:19 AM   Clinical Narrative:     For dc today , has no needs. Spouse at bedside to transport home.        Patient Goals and CMS Choice            Discharge Placement                       Discharge Plan and Services Additional resources added to the After Visit Summary for                                       Social Drivers of Health (SDOH) Interventions SDOH Screenings   Tobacco Use: Low Risk  (08/03/2023)     Readmission Risk Interventions    08/05/2023   10:16 AM  Readmission Risk Prevention Plan  Transportation Screening Complete  PCP or Specialist Appt within 5-7 Days Complete  Home Care Screening Complete  Medication Review (RN CM) Complete

## 2023-08-05 NOTE — Plan of Care (Signed)
  Problem: Clinical Measurements: Goal: Ability to maintain clinical measurements within normal limits will improve Outcome: Progressing Goal: Will remain free from infection Outcome: Progressing Goal: Respiratory complications will improve Outcome: Progressing   Problem: Elimination: Goal: Will not experience complications related to urinary retention Outcome: Progressing   Problem: Safety: Goal: Ability to remain free from injury will improve Outcome: Progressing

## 2023-08-06 ENCOUNTER — Ambulatory Visit: Admitting: Adult Health

## 2023-08-06 ENCOUNTER — Encounter: Payer: Self-pay | Admitting: Adult Health

## 2023-08-06 VITALS — BP 126/60 | HR 61 | Ht <= 58 in | Wt 112.4 lb

## 2023-08-06 DIAGNOSIS — F0150 Vascular dementia without behavioral disturbance: Secondary | ICD-10-CM

## 2023-08-06 DIAGNOSIS — Z8673 Personal history of transient ischemic attack (TIA), and cerebral infarction without residual deficits: Secondary | ICD-10-CM | POA: Diagnosis not present

## 2023-08-06 DIAGNOSIS — R7989 Other specified abnormal findings of blood chemistry: Secondary | ICD-10-CM

## 2023-08-06 MED ORDER — MEMANTINE HCL ER 28 MG PO CP24: 28.0000 mg | ORAL_CAPSULE | Freq: Every day | ORAL | 3 refills | Status: AC

## 2023-08-06 NOTE — Progress Notes (Signed)
 [facility] [address] [place]. [place] [address] [phone]       OFFICE FOLLOW UP VISIT NOTE  [name] Date of Birth:  [birth year] Medical Record Number:  [id number]   Primary neurologist: Dr. Rosemarie Reason for visit: Stroke and dementia follow-up   Chief complaint: Chief Complaint  Patient presents with   Follow-up    Pt in 3 with husband Pt here for memory f/u Pt just  d/c from hospital due to Afib/bradycardia  Aricept   was d/c       HPI:  Update 08/06/2023 JM: Patient returns for 76-month follow-up accompanied by her husband who provides majority of history.  Recent hospitalization in 7/29-7/31 after syncopal episode while at [facility] center, noted to be bradycardic.  CT head and MRI brain negative for acute findings.  Aricept  and metoprolol  discontinued.  Heart rate gradually improved but went into A-fib with RVR requiring amiodarone  drip and discharged on p.o. amiodarone .  Has follow-up with cardiology 8/21.  Husband has noted ongoing decline since prior visit, more so in the past few weeks but since HR stabilized, he feels her memory has already been improving and she reports feeling better overall. MMSE today 12/30, prior 10/30. Remains on Namenda  XR 28 mg daily, denies side effects.  No behavioral concerns.  Husband continues to provide 24/7 supervision. No new stroke/TIA symptoms.  Reports compliance on Eliquis  5 mg twice daily, atorvastatin  and Zetia .  Routinely follows with PCP Dr. Jakie for stroke risk factor management.      History provided for reference purposes only Update 01/14/2023 JM: Patient returns for 76-month stroke follow-up accompanied by her husband.  At prior visit with Dr. Rosemarie, reported continued cognitive decline, recommended switching memantine  10 mg BID to Namenda  XR 28mg  daily.   Husband provides majority of history.  He reports very slight cognitive decline since prior visit, more so in regards to  short-term memory.  MMSE today 10/30 (prior 13/30).  Has been tolerating Namenda  XR well without side effects and believes this has been helpful.  No behavioral concerns. Did move to a new apartment a couple months ago, she has had some difficulty finding things around the apartment and remembering more certain things are but this has been gradually improving. She is unable to travel as this causes confusion. Sleeps well at night, will nap during the day. Good appetite.  Husband has been working on increasing water intake.  She was seen in the ED last month after presyncopal episode suspected due to dehydration, slight increase in creatinine from 1.13 to 1.51. Has not had repeat labs.  No additional presyncopal events.  Husband ensures that she gets out of the house daily, they remain very active with different veterans events, he tries to encourage exercise.  Stable from stroke standpoint, no new stroke/TIA symptoms.  Remains on Eliquis  5 mg twice daily and atorvastatin  and Zetia .  Blood pressure well-controlled.  Routinely follows with PCP Dr. Yolande.  Update 07/01/2022 Dr. Rosemarie: She returns for follow-up after last visit 6 months ago.  She is accompanied by her husband.  She continues to have memory and cognitive difficulties.  Subsequently memory getting worse.  He is now getting to not being able to answer the phone currently.  She does have some sundowning and gets confused during the evening time.  There are no agitation or violent behavior.  She does not wander off.  She does not have any delusions or hallucinations.  She can recognize her  children but has trouble recognizing her grandchildren and other family members.  Memantine  10 mg twice daily which is tolerating well without side effects.  On Eliquis  tolerating well without bruising or bleeding.  Blood pressure under good control.  He is tolerating Lipitor  well without side effects.  On Mini-Mental status exam today she scored 13/30 which is fairly  stable compared with 14/30 at last visit.    Update 12/10/2021 JM: Patient returns for yearly stroke follow-up.  She unfortunately had recurrent stroke in left MCA not visualized on MRI vs TIA s/p TNK on 08/03/2021 likely due to new onset A-fib after presenting with acute onset confusion, dysarthria and right-sided weakness which improved after TNK.  She was placed on Eliquis  5 mg twice daily.  Reports cognition has worsened since recent stroke. Denies residual weakness or speech difficulty. Denies any new stroke/TIA symptoms. Remains on Namenda , denies side effects.  Husband notices worsening of memory if misses dose of Namenda .  MMSE today 14/30, was 14/30 1 year ago.  Since recent stroke, husband needs to assist more with ADLs such as bathing (will forget to wash certain areas) or picking out clothing (will dress it appropriately for weather conditions).  He tries to keep her active and socializes routinely.  Husband maintains all IADLs.  She will misplace items frequently around the home.  She will become frustrated/agitated when he questions were a certain item was placed otherwise no significant behavioral concerns.  Denies sleeps well.  Appetite good.    Reports compliance on Eliquis  and atorvastatin  and Zetia , denies side effects Blood pressure well controlled She has since had follow-up with cardiology and routinely follows with PCP    MRI BRAIN WO CONTRAST 08/05/2021 IMPRESSION: 1. No acute or reversible finding. 2. Chronic small vessel ischemia including remote basal ganglia infarct on the right.  CTA HEAD/NECK 08/03/2021 IMPRESSION: 1. 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 2. Tandem high-grade stenoses in the intracranial right vertebral artery. 3. No other significant stenosis in the neck. 4. No other significant proximal stenosis, aneurysm, or branch vessel occlusion within the Circle of Willis. 5. Aortic Atherosclerosis  (ICD10-I70.0).  Update 12/09/2020 JM: Returns for 1 year follow-up for history of stroke with residual cognitive impairment. She is accompanied by her husband  Overall stable since prior visit -denies new stroke/TIA symptoms Per husband, cognition continues to slowly decline more so short-term memory MMSE today 14/30 (prior 12/30) Remains on Namenda  10 mg twice daily -denies side effects.  Husband does notice if she misses 1 dose as she will be more agitated and worsening cognition Denies hallucinations, paranoia, delusions or agitation/aggression.  Sleeps well.  Good appetite. Able to maintain majority of ADLs independently but husband continues to provide 24/7 care and supervision He continues to try to keep her mentally stimulated -will have difficulty at times persuading her to do memory exercises such as games on her iPad as she will become easily frustrated due to difficulty operating iPad. She does not do any routine exercise but husband tries to keep her physically active  Compliant on aspirin  and atorvastatin  -denies side effects Blood pressure today 167/96 - known elevation at OV -monitors at home and typically ranging 150s/60s  Completed labs 12/2 at PCP office (unable to personally view via epic) - has PCP f/u 12/9  No new concerns at this time  Update 12/07/2019 JM: Ms. Allisa Einspahr returns for 9-month stroke follow-up accompanied by her husband.  Cognitive impairment post stroke continues to slowly  decline.  MMSE today 12/30 (prior 14/30).  Remains on Namenda  with continued benefit subjectively and denies side effects.  He does report occasional perseveration but denies any other behavioral concerns.  Husband provides 24/7 care and supervision with patient continuing to be active as able.  Denies new stroke/TIA symptoms.  On aspirin  325 mg daily and atorvastatin  80 mg daily without side effects.  Recent lipid panel showed LDL 69.  Blood pressure today 164/69 (typically elevated at  appointments). Stable at home per husband.  No further concerns at this time.  Update 06/06/2019 JM: Ms. Shantal Roan returns for stroke follow-up accompanied by her husband.  Residual deficits of cognitive impairment with husband reporting slow decline since prior visit greater with short-term memory, concentration and taking longer to perform normal activities.  He does endorse occasional wandering but no other behavioral concerns.  No evidence of depression or anxiety.  She does require assistance for IADLs and minimal assistance for ADLs.  MMSE today 14/30 (prior 13/30).  Continues on Namenda  10 mg twice daily with ongoing benefit and denies side effects.  Husband does notice a difference if she misses Namenda  dose.  Continues on aspirin  325 mg daily and atorvastatin  for secondary stroke prevention.  Lipid panel obtained 1 year prior satisfactory.  No recent lab work.  Blood pressure today elevated at 163/63, asymptomatic.  Blood pressure not routinely monitored at home.  No concerns at this time.  Update 12/06/2018 JM: Ms. Hover is a 78 year old female who is being seen today for stroke follow-up accompanied by her husband.  Residual deficits include cognitive impairment.  Was initiated on Namenda  at prior visit.  She has continued on Namenda  10 mg daily tolerating well without side effects.  Memory has been overall stable.  Per husband, she did miss a couple doses of Namenda  and he did notice a great difference in her cognition.  He tries to encourage her to do memory exercises but she does not do them routinely.  He continues to provide 24/7 care and supervision due to cognitive impairment and safety concerns.  Continues on aspirin  325 mg daily and atorvastatin  80 mg daily for secondary stroke prevention without side effects.  Blood pressure today elevated initially and on recheck 150/80.  Monitor his levels at home which has been stable.  She was evaluated by Dr. Chalice on 03/08/2018 for possible sleep apnea and  recommended undergoing sleep testing but has not done so at this time as office has attempted to call twice but unable to contact.  No further concerns at this time.  Update 01/18/2018 Dr. Rosemarie : She returns for follow-up after last visit 2 months ago.  She is accompanied by her husband.  She continues to have cognitive impairment and memory difficulties which are not improving.  She has very poor short-term memory.  She cannot be left alone.  She needs constant reminders and supervision.  She is unable to do things that she used to do in the past and husband needs to keep a close eye on her.  She did undergo lab work for reversible causes of cognitive impairment on 11/10/2017 and vitamin B12, TSH and RPR were all normal.  EEG done on 12/07/2017 was also normal.  CT angiogram of the brain and neck done on 11/19/2017 showed no significant for current intra-and extracranial stenosis and the previously seen right carotid thrombus had completely resolved.  Patient has poor appetite and has weight loss and hence would not be a good candidate for Aricept .  She has not tried Namenda  yet.  She is tolerating aspirin  well without any bleeding or bruising.  She has stopped Coumadin  after his CT Angie results showed resolution of the clot.  Her blood pressure is well controlled and today it is 121/6 3.  The patient was having a dry cough and her primary physician Dr. Jakie plans to change lisinopril  to alternative blood pressure medicine.  She remains on Lipitor  which is tolerating well without any side effects.  She has not had any recurrent stroke or TIA symptoms.  Initial visit 11/10/2017 Dr. Rosemarie; Ms Makeshia Seat is a pleasant 78 year old [place] lady who is seen today for initial office consultation visit for stroke.  She is accompanied by her husband.  History is obtained from them and review of electronic medical records.  I have personally reviewed imaging films. HPI ( Dr Voncile ) MARLA Odis Forth Omaira Mellen is an  78 y.o. female with a PMH of HTN who presented to the ED with a 2 day history of speech changes, AMS and left face weakness.Per patient family member these symptoms have been intermittent for weeks, but for the past 2 days the patient has had increasing confusion. She is doing odd things like attempting to make coffee w/o coffee grounds. She is becoming more easily agitated and argues about the time. 2 days ago he noticed that the left side of her face seemed different and that she was not speaking as clearly. Denies any ETOH, or drug abuse. Denies any CP or SOB. Due to the persistence of symptoms and then becoming more frequent as well as the facial droop, there has been decided to bring the patient to the emergency room for evaluation.Noncontrast CT of the head was done that showed a hypodensity suggestive of subacute infarct in the right basal ganglia/caudate.Neurological consultation was obtained for further evaluation. .LKW: 2-4 day ago; uncertain of time NIHSS: 3 MRI scan of the brain showed right frontal deep white matter infarct extending into the head of the caudate with scattered peripheral cortical right frontal infarcts.  MRI of the brain showed a 5 mm distal right MCA aneurysm.  2D echo showed normal ejection fraction without cardiac source of embolus.  LDL cholesterol was elevated at 180 mg percent.  Hemoglobin A1c was 6.1.  CT angiogram of the head and neck showed right common carotid artery thrombus which was nonocclusive.  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  Patient was felt to have embolized distally from the right common carotid artery thrombus and hence was started on anticoagulation with warfarin and on Lipitor  for his elevated lipids.  Patient is currently living at home with her husband.  She has significant cognitive impairment with short-term memory being very poor.  She is also quite slow to process information and has trouble with multitasking.  She requires supervision for  activities like cooking and working in the kitchen which she used to love.  She has left the burners on several times while cooking now.  She is tolerating warfarin well without bleeding or bruising and INR has been fairly stable.  Her facial droop and speech appear to have improved.  Patient's husband is wondering when her cognitive difficulties are going to improve and whether she will be able to become fully independent.  She has no prior history of cognitive impairment or memory difficulties.  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     ROS:   14  system review of systems is positive for memory loss, confusion and all other systems negative   PMH:  Past Medical History:  Diagnosis Date   Atrial fibrillation (HCC)    Cerebrovascular accident (CVA) (HCC)    Hypertension    no medications for years    Social History:  Social History   Socioeconomic History   Marital status: Married    Spouse name: Not on file   Number of children: Not on file   Years of education: Not on file   Highest education level: Not on file  Occupational History   Occupation: retired  Tobacco Use   Smoking status: Never   Smokeless tobacco: Never  Vaping Use   Vaping status: Not on file  Substance and Sexual Activity   Alcohol  use: Never   Drug use: Never   Sexual activity: Not on file  Other Topics Concern   Not on file  Social History Narrative   Former Editor, commissioning.  Lives alone, but has 5 children in the area who are grown and supportive.  +Financial difficulties - trouble paying bills.  She has been married to her current husband for 3 years.   Social Drivers of Corporate investment banker Strain: Not on file  Food Insecurity: Not on file  Transportation Needs: Not on file  Physical Activity: Not on file  Stress: Not on file  Social Connections: Not on file  Intimate Partner Violence: Not on file    Medications:   Current  Outpatient Medications on File Prior to Visit  Medication Sig Dispense Refill   amiodarone  (PACERONE ) 200 MG tablet Take 1 tablet (200 mg total) by mouth daily. 90 tablet 0   apixaban  (ELIQUIS ) 5 MG TABS tablet Take 1 tablet (5 mg total) by mouth 2 (two) times daily. 60 tablet 1   atorvastatin  (LIPITOR ) 80 MG tablet Take 1 tablet (80 mg total) by mouth daily. 90 tablet 0   ezetimibe  (ZETIA ) 10 MG tablet Take 10 mg by mouth daily.     losartan  (COZAAR ) 100 MG tablet Take 1 tablet (100 mg total) by mouth daily. 30 tablet 1   memantine  (NAMENDA  XR) 28 MG CP24 24 hr capsule TAKE 1 CAPSULE BY MOUTH EVERY DAY 90 capsule 2   No current facility-administered medications on file prior to visit.    Allergies:  No Known Allergies  Physical Exam  Today's Vitals   08/06/23 0931  BP: 126/60  Pulse: 61  Weight: 112 lb 6.4 oz (51 kg)  Height: 4' 6 (1.372 m)   Body mass index is 27.1 kg/m.  General: petite frail very pleasant [place] lady, seated, in no evident distress Head: head normocephalic and atraumatic.   Neck: supple with no carotid or supraclavicular bruits Cardiovascular: regular rate and rhythm, no murmurs Musculoskeletal: no deformity Skin:  no rash/petichiae Vascular:  Normal pulses all extremities  Neurologic Exam Mental Status: Awake and fully alert. No evidence of aphasia or dysarthria.  Follows commands without great difficulty.  Mood and affect appropriate.  Husband provides majority of history.    08/06/2023    9:32 AM 01/14/2023    7:41 AM 07/01/2022    1:00 PM  MMSE - Mini Mental State Exam  Orientation to time 0 0 0  Orientation to Place 3 3 2   Registration 3 3 3   Attention/ Calculation 0 0 0  Recall 0 0 0  Language- name 2 objects 2 2 2   Language- repeat 0 0 1  Language-  follow 3 step command 3 2 3   Language- read & follow direction 1 0 1  Write a sentence 0 0 1  Copy design 0 0 0  Total score 12 10 13    Cranial Nerves: Pupils equal, briskly reactive to  light. Extraocular movements full without nystagmus. Visual fields full to confrontation. Hearing intact. Facial sensation intact. Face, tongue, palate moves normally and symmetrically.  Motor: Normal bulk and tone. Normal strength in all tested extremity muscles except chronic mild LLE weakness from old injury 15 Sensory.: intact to touch , pinprick , position and vibratory sensation.  Coordination: Rapid alternating movements normal in all extremities. Finger-to-nose and heel-to-shin performed accurately bilaterally. Gait and Station: Arises from chair without difficulty. Stance is normal. Gait demonstrates normal stride length and balance without use of assistive device Reflexes: 1+ and symmetric. Toes downgoing.        ASSESSMENT/PLAN: 78 year old [place] lady with small stroke not seen on imaging versus TIA s/p TNK on 08/03/2021 likely in setting of new onset A-fib and history of right MCA branch infarct in September 2019 secondary to thromboembolism from right carotid thrombus with significant residual vascular cognitive impairment.  Vascular risk factors of hypertension, carotid thrombus and hyperlipidemia.  Recent hospitalization after syncopal event in setting of bradycardia, stabilized after medication adjustments.      L MCA stroke Hx of R MCA stroke :  Residual deficit: Cognitive impairment (see #2).  Continue Eliquis  5 mg BID, atorvastatin  80 mg daily and Zetia  for secondary stroke prevention.  Managed by PCP/cardiology.  Continue to follow with cardiology for atrial fibrillation and Eliquis  management Discussed secondary stroke prevention measures and importance of close PCP f/u for aggressive stroke risk factor management including HTN with BP goal<130/90 and HLD with LDL goal<70  Vascular dementia w/o behaviors:  Great decline over the past several weeks, possibly in setting of bradycardia, now stabilized after discontinuing metoprolol  and donepezil  as well as initiating  amiodarone  during recent hospitalization. MMSE today 12/30 (prior 10/30)  continue Namenda  XR 28 mg daily - refill provided Remain off donepezil  due to bradycardia Highly encourage continued memory exercises such as puzzles, reading and card games as well as routine exercise, socialization, healthy diet and adequate sleep as well as importance of managing stroke risk factors    Follow-up in 6-8 months or call earlier if needed   CC:  Yolande Toribio MATSU, MD    I personally spent a total of 30 minutes in the care of the patient today including preparing to see the patient, performing a medically appropriate exam/evaluation, counseling and educating, placing orders, and documenting clinical information in the EHR. Time also spent reviewing recent hospitalization.  Harlene Bogaert, AGNP-BC  [facility] [address] [place], [place] [address]  Phone [phone] Fax [phone] Note: This document was prepared with digital dictation and possible smart phrase technology. Any transcriptional errors that result from this process are unintentional.

## 2023-08-06 NOTE — Patient Instructions (Addendum)
 Your Plan:  Continue Namenda  XR 20 mg daily  Continue Eliquis , atorvastatin  and Zetia  and continue close PCP and cardiology follow-up for stroke risk factor management      Follow up in 6-8 months or call earlier if needed      Thank you for coming to see us  at [facility]. I hope we have been able to provide you high quality care today.  You may receive a patient satisfaction survey over the next few weeks. We would appreciate your feedback and comments so that we may continue to improve ourselves and the health of our patients.

## 2023-08-16 DIAGNOSIS — I4891 Unspecified atrial fibrillation: Secondary | ICD-10-CM | POA: Diagnosis not present

## 2023-08-16 DIAGNOSIS — F01B0 Vascular dementia, moderate, without behavioral disturbance, psychotic disturbance, mood disturbance, and anxiety: Secondary | ICD-10-CM | POA: Diagnosis not present

## 2023-08-16 DIAGNOSIS — R001 Bradycardia, unspecified: Secondary | ICD-10-CM | POA: Diagnosis not present

## 2023-08-16 DIAGNOSIS — I10 Essential (primary) hypertension: Secondary | ICD-10-CM | POA: Diagnosis not present

## 2023-08-16 DIAGNOSIS — Z7901 Long term (current) use of anticoagulants: Secondary | ICD-10-CM | POA: Diagnosis not present

## 2023-08-16 DIAGNOSIS — R55 Syncope and collapse: Secondary | ICD-10-CM | POA: Diagnosis not present

## 2023-08-16 DIAGNOSIS — E785 Hyperlipidemia, unspecified: Secondary | ICD-10-CM | POA: Diagnosis not present

## 2023-08-25 NOTE — Progress Notes (Unsigned)
 Cardiology Clinic Note   Patient Name: Sherry Fitzgerald Date of Encounter: 08/26/2023  Primary Care Provider:  Yolande Toribio MATSU, MD Primary Cardiologist:  Vinie JAYSON Maxcy, MD  Patient Profile    Sherry Fitzgerald 78 year old female presents to the clinic today for follow-up evaluation of her paroxysmal atrial fibrillation.  Past Medical History    Past Medical History:  Diagnosis Date   Atrial fibrillation (HCC)    Cerebrovascular accident (CVA) (HCC)    Hypertension    no medications for years   Past Surgical History:  Procedure Laterality Date   CHOLECYSTECTOMY  2014    Allergies  No Known Allergies  History of Present Illness    Sherry Fitzgerald has a PMH of HTN, HLD, TIA, atrial fibrillation, aphasia, right hemiparesis, hypoglycemia, and chest wall pain.  She is a special forces veteran.   She was admitted to the hospital on 08/03/2021 and discharged on 08/05/2021.  She was diagnosed with CVA.  She received TNK for her stroke symptoms and noted improvement.  Her MRI showed no acute abnormalities.  It was felt the patient had TIA or a small stroke which was aborted by TNK.  She was noted to have a fever on admission.  Her UA and chest x-ray were negative.  Her white count remained normal.  Antibiotics were administered and her fever resolved.  She was found to have new onset atrial fibrillation.  Cardiology was consulted.  She was started on apixaban .  Her aspirin  was discontinued and she was started on losartan  for elevated blood pressure.   She presented to the clinic 08/21/21 for follow-up evaluation stated she felt well.  She  had no further episodes of increased heart rate.  Her husband purchased a mobile cardiac monitor and we reviewed readings.  They showed sinus rhythm and sinus bradycardia.  Her EKG  showed sinus bradycardia septal infarct undetermined age, 45 bpm.  She had been walking some.  Her blood pressures at home have been fairly well controlled in the 120s-130  systolic.  Initially her blood pressure was 142/78 and on recheck it is 132/70.  I  continued her medication regimen, have her increase physical activity as tolerated, give salty 6 diet sheet, and planned follow-up for 6 months.  She was admitted to the hospital on 08/04/2023 and discharged on 08/05/2023.  She presented with atrial fibrillation with rapid ventricular response.  She received IV amiodarone .  She was noted to have bradycardia in the 40s with left bundle branch block.  [facility]  It was felt that this could have been due to dehydration.  She does have advanced dementia.  Conservative management was recommended.  Her Aricept  was stopped during admission.  It was felt that utilizing amiodarone  was reasonable as long as no significant bradycardia in the 20s 30s was observed.  At discharge her heart rate was noted to be in the upper 50s.  She was transition to p.o. amiodarone .  Her echocardiogram showed normal LVEF.  She presents to the clinic today for follow-up evaluation with her husband.  She is somewhat mentally improved.  She has been following with neurology for her dementia.  Her blood pressure today is initially 160/72 and on recheck is 124/58.  We reviewed her recent hospitalization.  She has not had any further episodes of presyncope or syncope.  Her husband reports that she is showing more interest in things.  He is hoping  to have her do some swimming and increase her physical activity as tolerated.  He continues to monitor her pulse with Kardia mobile and pulse oximetry.  These have remained stable.  Will plan follow-up in 4 to 6 months.  I will continue her current medication regimen.  Today denies chest pain, shortness of breath, lower extremity edema, fatigue, palpitations, melena, hematuria, hemoptysis, diaphoresis, weakness, presyncope, syncope, orthopnea, and PND.    Home Medications    Prior to Admission medications   Medication Sig  Start Date End Date Taking? Authorizing Provider  amiodarone  (PACERONE ) 200 MG tablet Take 1 tablet (200 mg total) by mouth daily. 08/05/23   Amin, Ankit C, MD  apixaban  (ELIQUIS ) 5 MG TABS tablet Take 1 tablet (5 mg total) by mouth 2 (two) times daily. 08/05/21   de Clint Kill, Cortney E, NP  atorvastatin  (LIPITOR ) 80 MG tablet Take 1 tablet (80 mg total) by mouth daily. 11/08/17   Genette Cadet, MD  ezetimibe  (ZETIA ) 10 MG tablet Take 10 mg by mouth daily. 06/09/21   [provider]  losartan  (COZAAR ) 100 MG tablet Take 1 tablet (100 mg total) by mouth daily. 08/06/21   de Clint Kill, Cortney E, NP  memantine  (NAMENDA  XR) 28 MG CP24 24 hr capsule Take 1 capsule (28 mg total) by mouth daily. 08/06/23   Whitfield Raisin, NP    Family History    Family History  Problem Relation Age of Onset   CVA Mother    Heart disease Father 22   CVA Father    Alzheimer's disease Neg Hx    Dementia Neg Hx    She indicated that her mother is deceased. She indicated that her father is deceased. She indicated that the status of her neg hx is unknown.  Social History    Social History   Socioeconomic History   Marital status: Married    Spouse name: Not on file   Number of children: Not on file   Years of education: Not on file   Highest education level: Not on file  Occupational History   Occupation: retired  Tobacco Use   Smoking status: Never   Smokeless tobacco: Never  Vaping Use   Vaping status: Not on file  Substance and Sexual Activity   Alcohol  use: Never   Drug use: Never   Sexual activity: Not on file  Other Topics Concern   Not on file  Social History Narrative   Former Editor, commissioning.  Lives alone, but has 5 children in the area who are grown and supportive.  +Financial difficulties - trouble paying bills.  She has been married to her current husband for 3 years.   Social Drivers of Corporate investment banker Strain: Not on file  Food Insecurity: Not on file   Transportation Needs: Not on file  Physical Activity: Not on file  Stress: Not on file  Social Connections: Not on file  Intimate Partner Violence: Not on file     Review of Systems    General:  No chills, fever, night sweats or weight changes.  Cardiovascular:  No chest pain, dyspnea on exertion, edema, orthopnea, palpitations, paroxysmal nocturnal dyspnea. Dermatological: No rash, lesions/masses Respiratory: No cough, dyspnea Urologic: No hematuria, dysuria Abdominal:   No nausea, vomiting, diarrhea, bright red blood per rectum, melena, or hematemesis Neurologic:  No visual changes, wkns, changes in mental status. All other systems reviewed and are otherwise negative except as noted above.  Physical Exam  VS:  BP (!) 124/58   Pulse 65   Ht 4' 8 (1.422 m)   Wt 111 lb 12.8 oz (50.7 kg)   SpO2 98%   BMI 25.07 kg/m  , BMI Body mass index is 25.07 kg/m. GEN: Well nourished, well developed, in no acute distress. HEENT: normal. Neck: Supple, no JVD, carotid bruits, or masses. Cardiac: RRR, no murmurs, rubs, or gallops. No clubbing, cyanosis, edema.  Radials/DP/PT 2+ and equal bilaterally.  Respiratory:  Respirations regular and unlabored, clear to auscultation bilaterally. GI: Soft, nontender, nondistended, BS + x 4. MS: no deformity or atrophy. Skin: warm and dry, no rash. Neuro:  Strength and sensation are intact. Psych: Normal affect.  Accessory Clinical Findings    Recent Labs: 03/23/2023: B Natriuretic Peptide 116.8 08/04/2023: ALT 26; TSH 2.013 08/05/2023: BUN 12; Creatinine, Ser 1.45; Hemoglobin 10.8; Magnesium 1.8; Platelets 179; Potassium 4.7; Sodium 140   Recent Lipid Panel    Component Value Date/Time   CHOL 97 08/04/2021 0204   CHOL 150 06/07/2019 0850   TRIG 44 08/04/2021 0204   HDL 50 08/04/2021 0204   HDL 66 06/07/2019 0850   CHOLHDL 1.9 08/04/2021 0204   VLDL 9 08/04/2021 0204   LDLCALC 38 08/04/2021 0204   LDLCALC 69 06/07/2019 0850   LDLDIRECT  191 (H) 07/30/2014 1659         ECG personally reviewed by me today-none today.     Echocardiogram 08/04/2023   IMPRESSIONS     1. Left ventricular ejection fraction, by estimation, is 60 to 65%. The  left ventricle has normal function. The left ventricle has no regional  wall motion abnormalities. Left ventricular diastolic parameters were  normal.   2. Right ventricular systolic function is normal. The right ventricular  size is normal.   3. Left atrial size was mildly dilated.   4. Right atrial size was moderately dilated.   5. The mitral valve is normal in structure. No evidence of mitral valve  regurgitation. No evidence of mitral stenosis.   6. The aortic valve is normal in structure. There is moderate  calcification of the aortic valve. Aortic valve regurgitation is not  visualized. Aortic valve sclerosis/calcification is present, without any  evidence of aortic stenosis.   7. The inferior vena cava is normal in size with greater than 50%  respiratory variability, suggesting right atrial pressure of 3 mmHg.   FINDINGS   Left Ventricle: Left ventricular ejection fraction, by estimation, is 60  to 65%. The left ventricle has normal function. The left ventricle has no  regional wall motion abnormalities. The left ventricular internal cavity  size was normal in size. There is   no left ventricular hypertrophy. Left ventricular diastolic parameters  were normal.   Right Ventricle: The right ventricular size is normal. No increase in  right ventricular wall thickness. Right ventricular systolic function is  normal.   Left Atrium: Left atrial size was mildly dilated.   Right Atrium: Right atrial size was moderately dilated.   Pericardium: There is no evidence of pericardial effusion.   Mitral Valve: The mitral valve is normal in structure. No evidence of  mitral valve regurgitation. No evidence of mitral valve stenosis.   Tricuspid Valve: The tricuspid valve is  normal in structure. Tricuspid  valve regurgitation is mild . No evidence of tricuspid stenosis.   Aortic Valve: The aortic valve is normal in structure. There is moderate  calcification of the aortic valve. Aortic valve regurgitation is not  visualized. Aortic  valve sclerosis/calcification is present, without any  evidence of aortic stenosis.   Pulmonic Valve: The pulmonic valve was normal in structure. Pulmonic valve  regurgitation is not visualized. No evidence of pulmonic stenosis.   Aorta: The aortic root is normal in size and structure.   Venous: The inferior vena cava is normal in size with greater than 50%  respiratory variability, suggesting right atrial pressure of 3 mmHg.   IAS/Shunts: No atrial level shunt detected by color flow Doppler.     Assessment & Plan   1.  Paroxysmal atrial fibrillation-heart rate today 65 bpm.  Denies episodes of lightheadedness, presyncope or syncope.  Tolerating amiodarone  well.  Reports compliance with apixaban .  Denies bleeding issues. Avoid triggers caffeine, chocolate, EtOH, dehydration etc. Continue amiodarone , apixaban   Essential hypertension-BP today 124/58. Maintain blood pressure log Continue losartan   Hyperlipidemia-LDL 48 on 1/25. Continue ezetimibe , atorvastatin  High-fiber diet  Syncope and collapse-denies episodes of presyncope, lightheadedness, recent falls. Continue to monitor Maintain p.o. hydration Lower extremity support stockings  Disposition: Follow-up with Dr. Mona or me in 4 to 6 months.   Josefa HERO. Pietra Zuluaga NP-C     08/26/2023, 2:46 PM [facility] [address] [place], [place] [address] Office [phone]      I spent 14 minutes examining this patient, reviewing medications, and using patient centered shared decision making involving their cardiac care.   I spent  20 minutes reviewing past medical history,  medications, and prior cardiac tests.

## 2023-08-26 ENCOUNTER — Encounter: Payer: Self-pay | Admitting: General Practice

## 2023-08-26 ENCOUNTER — Ambulatory Visit: Attending: General Practice | Admitting: General Practice

## 2023-08-26 VITALS — BP 124/58 | HR 65 | Ht <= 58 in | Wt 111.8 lb

## 2023-08-26 DIAGNOSIS — E782 Mixed hyperlipidemia: Secondary | ICD-10-CM

## 2023-08-26 DIAGNOSIS — I480 Paroxysmal atrial fibrillation: Secondary | ICD-10-CM

## 2023-08-26 DIAGNOSIS — I10 Essential (primary) hypertension: Secondary | ICD-10-CM

## 2023-08-26 DIAGNOSIS — R55 Syncope and collapse: Secondary | ICD-10-CM

## 2023-08-26 NOTE — Patient Instructions (Signed)
 Medication Instructions:  Your physician recommends that you continue on your current medications as directed. Please refer to the Current Medication list given to you today.  *If you need a refill on your cardiac medications before your next appointment, please call your pharmacy*  Lab Work: NONE If you have labs (blood work) drawn today and your tests are completely normal, you will receive your results only by: MyChart Message (if you have MyChart) OR A paper copy in the mail If you have any lab test that is abnormal or we need to change your treatment, we will call you to review the results.  Testing/Procedures: NONE  Follow-Up: At [facility], you and your health needs are our priority.  As part of our continuing mission to provide you with exceptional heart care, our providers are all part of one team.  This team includes your primary Cardiologist (physician) and Advanced Practice Providers or APPs (Physician Assistants and Nurse Practitioners) who all work together to provide you with the care you need, when you need it.  Your next appointment:   4-6 month(s)  Provider:   Vinie JAYSON Maxcy, MD or Josefa Beauvais, NP  We recommend signing up for the patient portal called MyChart.  Sign up information is provided on this After Visit Summary.  MyChart is used to connect with patients for Virtual Visits (Telemedicine).  Patients are able to view lab/test results, encounter notes, upcoming appointments, etc.  Non-urgent messages can be sent to your provider as well.   To learn more about what you can do with MyChart, go to [email or web address].   Other Instructions  Increase your water intake: Maintain hydration   Please check your blood pressure 1 time a week and keep a blood pressure log

## 2023-10-25 ENCOUNTER — Telehealth: Payer: Self-pay | Admitting: General Practice

## 2023-10-25 MED ORDER — AMIODARONE HCL 200 MG PO TABS: 200.0000 mg | ORAL_TABLET | Freq: Every day | ORAL | 2 refills | Status: AC

## 2023-10-25 NOTE — Telephone Encounter (Signed)
 Refills sent in

## 2023-10-25 NOTE — Telephone Encounter (Signed)
*  STAT* If patient is at the pharmacy, call can be transferred to refill team.   1. Which medications need to be refilled? (please list name of each medication and dose if known)   amiodarone  (PACERONE ) 200 MG tablet     4. Which pharmacy/location (including street and city if local pharmacy) is medication to be sent to?  [id number] - [place], [place] - [address]     5. Do they need a 30 day or 90 day supply? 90

## 2023-12-23 NOTE — Progress Notes (Unsigned)
 Cardiology Clinic Note   Patient Name: Sherry Fitzgerald Date of Encounter: 12/23/2023  Primary Care Provider:  Yolande Toribio MATSU, MD Primary Cardiologist:  Vinie JAYSON Maxcy, MD  Patient Profile    Sherry Fitzgerald 78 year old female presents to the clinic today for follow-up evaluation of her paroxysmal atrial fibrillation.  Past Medical History    Past Medical History:  Diagnosis Date   Atrial fibrillation (HCC)    Cerebrovascular accident (CVA) (HCC)    Hypertension    no medications for years   Past Surgical History:  Procedure Laterality Date   CHOLECYSTECTOMY  2014    Allergies  No Known Allergies  History of Present Illness    Sherry Fitzgerald has a PMH of HTN, HLD, TIA, atrial fibrillation, aphasia, right hemiparesis, hypoglycemia, and chest wall pain.  She is a special forces veteran.   She was admitted to the hospital on 08/03/2021 and discharged on 08/05/2021.  She was diagnosed with CVA.  She received TNK for her stroke symptoms and noted improvement.  Her MRI showed no acute abnormalities.  It was felt the patient had TIA or a small stroke which was aborted by TNK.  She was noted to have a fever on admission.  Her UA and chest x-ray were negative.  Her white count remained normal.  Antibiotics were administered and her fever resolved.  She was found to have new onset atrial fibrillation.  Cardiology was consulted.  She was started on apixaban .  Her aspirin  was discontinued and she was started on losartan  for elevated blood pressure.   She presented to the clinic 08/21/21 for follow-up evaluation stated she felt well.  She  had no further episodes of increased heart rate.  Her husband purchased a mobile cardiac monitor and we reviewed readings.  They showed sinus rhythm and sinus bradycardia.  Her EKG  showed sinus bradycardia septal infarct undetermined age, 45 bpm.  She had been walking some.  Her blood pressures at home have been fairly well controlled in the 120s-130  systolic.  Initially her blood pressure was 142/78 and on recheck it is 132/70.  I  continued her medication regimen, have her increase physical activity as tolerated, give salty 6 diet sheet, and planned follow-up for 6 months.  She was admitted to the hospital on 08/04/2023 and discharged on 08/05/2023.  She presented with atrial fibrillation with rapid ventricular response.  She received IV amiodarone .  She was noted to have bradycardia in the 40s with left bundle branch block.  [facility]  It was felt that this could have been due to dehydration.  She does have advanced dementia.  Conservative management was recommended.  Her Aricept  was stopped during admission.  It was felt that utilizing amiodarone  was reasonable as long as no significant bradycardia in the 20s 30s was observed.  At discharge her heart rate was noted to be in the upper 50s.  She was transition to p.o. amiodarone .  Her echocardiogram showed normal LVEF.  She presents to the clinic today for follow-up evaluation with her husband.  She is somewhat mentally improved.  She has been following with neurology for her dementia.  Her blood pressure today is initially 160/72 and on recheck is 124/58.  We reviewed her recent hospitalization.  She has not had any further episodes of presyncope or syncope.  Her husband reports that she is showing more interest in things.  He is hoping  to have her do some swimming and increase her physical activity as tolerated.  He continues to monitor her pulse with Kardia mobile and pulse oximetry.  These have remained stable.  Will plan follow-up in 4 to 6 months.  I will continue her current medication regimen.  Today denies chest pain, shortness of breath, lower extremity edema, fatigue, palpitations, melena, hematuria, hemoptysis, diaphoresis, weakness, presyncope, syncope, orthopnea, and PND.    Home Medications    Prior to Admission medications   Medication Sig  Start Date End Date Taking? Authorizing Provider  amiodarone  (PACERONE ) 200 MG tablet Take 1 tablet (200 mg total) by mouth daily. 08/05/23   Amin, Ankit C, MD  apixaban  (ELIQUIS ) 5 MG TABS tablet Take 1 tablet (5 mg total) by mouth 2 (two) times daily. 08/05/21   de Clint Kill, Cortney E, NP  atorvastatin  (LIPITOR ) 80 MG tablet Take 1 tablet (80 mg total) by mouth daily. 11/08/17   Genette Cadet, MD  ezetimibe  (ZETIA ) 10 MG tablet Take 10 mg by mouth daily. 06/09/21   [provider]  losartan  (COZAAR ) 100 MG tablet Take 1 tablet (100 mg total) by mouth daily. 08/06/21   de Clint Kill, Cortney E, NP  memantine  (NAMENDA  XR) 28 MG CP24 24 hr capsule Take 1 capsule (28 mg total) by mouth daily. 08/06/23   Whitfield Raisin, NP    Family History    Family History  Problem Relation Age of Onset   CVA Mother    Heart disease Father 24   CVA Father    Alzheimer's disease Neg Hx    Dementia Neg Hx    She indicated that her mother is deceased. She indicated that her father is deceased. She indicated that the status of her neg hx is unknown.  Social History    Social History   Socioeconomic History   Marital status: Married    Spouse name: Not on file   Number of children: Not on file   Years of education: Not on file   Highest education level: Not on file  Occupational History   Occupation: retired  Tobacco Use   Smoking status: Never   Smokeless tobacco: Never  Vaping Use   Vaping status: Not on file  Substance and Sexual Activity   Alcohol  use: Never   Drug use: Never   Sexual activity: Not on file  Other Topics Concern   Not on file  Social History Narrative   Former Editor, 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  Lives alone, but has 5 children in the area who are grown and supportive.  +Financial difficulties - trouble paying bills.  She has been married to her current husband for 3 years.   Social Drivers of Health   Tobacco Use: Low Risk (08/26/2023)   Patient History    Smoking Tobacco Use: Never     Smokeless Tobacco Use: Never    Passive Exposure: Not on file  Financial Resource Strain: Not on file  Food Insecurity: Not on file  Transportation Needs: Not on file  Physical Activity: Not on file  Stress: Not on file  Social Connections: Not on file  Intimate Partner Violence: Not on file  Depression (EYV7-0): Not on file  Alcohol  Screen: Not on file  Housing: Not on file  Utilities: Not on file  Health Literacy: Not on file     Review of Systems    General:  No chills, fever, night sweats or weight changes.  Cardiovascular:  No chest pain, dyspnea on exertion,  edema, orthopnea, palpitations, paroxysmal nocturnal dyspnea. Dermatological: No rash, lesions/masses Respiratory: No cough, dyspnea Urologic: No hematuria, dysuria Abdominal:   No nausea, vomiting, diarrhea, bright red blood per rectum, melena, or hematemesis Neurologic:  No visual changes, wkns, changes in mental status. All other systems reviewed and are otherwise negative except as noted above.  Physical Exam    VS:  There were no vitals taken for this visit. , BMI There is no height or weight on file to calculate BMI. GEN: Well nourished, well developed, in no acute distress. HEENT: normal. Neck: Supple, no JVD, carotid bruits, or masses. Cardiac: RRR, no murmurs, rubs, or gallops. No clubbing, cyanosis, edema.  Radials/DP/PT 2+ and equal bilaterally.  Respiratory:  Respirations regular and unlabored, clear to auscultation bilaterally. GI: Soft, nontender, nondistended, BS + x 4. MS: no deformity or atrophy. Skin: warm and dry, no rash. Neuro:  Strength and sensation are intact. Psych: Normal affect.  Accessory Clinical Findings    Recent Labs: 03/23/2023: B Natriuretic Peptide 116.8 08/04/2023: ALT 26; TSH 2.013 08/05/2023: BUN 12; Creatinine, Ser 1.45; Hemoglobin 10.8; Magnesium 1.8; Platelets 179; Potassium 4.7; Sodium 140   Recent Lipid Panel    Component Value Date/Time   CHOL 97 08/04/2021 0204    CHOL 150 06/07/2019 0850   TRIG 44 08/04/2021 0204   HDL 50 08/04/2021 0204   HDL 66 06/07/2019 0850   CHOLHDL 1.9 08/04/2021 0204   VLDL 9 08/04/2021 0204   LDLCALC 38 08/04/2021 0204   LDLCALC 69 06/07/2019 0850   LDLDIRECT 191 (H) 07/30/2014 1659    No BP recorded.  {Refresh Note OR Click here to enter BP  :1}***    ECG personally reviewed by me today-none today.     Echocardiogram 08/04/2023   IMPRESSIONS     1. Left ventricular ejection fraction, by estimation, is 60 to 65%. The  left ventricle has normal function. The left ventricle has no regional  wall motion abnormalities. Left ventricular diastolic parameters were  normal.   2. Right ventricular systolic function is normal. The right ventricular  size is normal.   3. Left atrial size was mildly dilated.   4. Right atrial size was moderately dilated.   5. The mitral valve is normal in structure. No evidence of mitral valve  regurgitation. No evidence of mitral stenosis.   6. The aortic valve is normal in structure. There is moderate  calcification of the aortic valve. Aortic valve regurgitation is not  visualized. Aortic valve sclerosis/calcification is present, without any  evidence of aortic stenosis.   7. The inferior vena cava is normal in size with greater than 50%  respiratory variability, suggesting right atrial pressure of 3 mmHg.   FINDINGS   Left Ventricle: Left ventricular ejection fraction, by estimation, is 60  to 65%. The left ventricle has normal function. The left ventricle has no  regional wall motion abnormalities. The left ventricular internal cavity  size was normal in size. There is   no left ventricular hypertrophy. Left ventricular diastolic parameters  were normal.   Right Ventricle: The right ventricular size is normal. No increase in  right ventricular wall thickness. Right ventricular systolic function is  normal.   Left Atrium: Left atrial size was mildly dilated.   Right  Atrium: Right atrial size was moderately dilated.   Pericardium: There is no evidence of pericardial effusion.   Mitral Valve: The mitral valve is normal in structure. No evidence of  mitral valve regurgitation. No evidence of mitral  valve stenosis.   Tricuspid Valve: The tricuspid valve is normal in structure. Tricuspid  valve regurgitation is mild . No evidence of tricuspid stenosis.   Aortic Valve: The aortic valve is normal in structure. There is moderate  calcification of the aortic valve. Aortic valve regurgitation is not  visualized. Aortic valve sclerosis/calcification is present, without any  evidence of aortic stenosis.   Pulmonic Valve: The pulmonic valve was normal in structure. Pulmonic valve  regurgitation is not visualized. No evidence of pulmonic stenosis.   Aorta: The aortic root is normal in size and structure.   Venous: The inferior vena cava is normal in size with greater than 50%  respiratory variability, suggesting right atrial pressure of 3 mmHg.   IAS/Shunts: No atrial level shunt detected by color flow Doppler.     Assessment & Plan   1.  Paroxysmal atrial fibrillation-heart rate today 65 bpm.  Denies episodes of lightheadedness, presyncope or syncope.  Tolerating amiodarone  well.  Reports compliance with apixaban .  Denies bleeding issues. Avoid triggers caffeine, chocolate, EtOH, dehydration etc. Continue amiodarone , apixaban   Essential hypertension-BP today 124/58. Maintain blood pressure log Continue losartan   Hyperlipidemia-LDL 48 on 1/25. Continue ezetimibe , atorvastatin  High-fiber diet  Syncope and collapse-denies episodes of presyncope, lightheadedness, recent falls. Continue to monitor Maintain p.o. hydration Lower extremity support stockings  Disposition: Follow-up with Dr. Mona or me in 4 to 6 months.   Josefa HERO. Enedina Pair NP-C     12/23/2023, 1:12 PM [facility] [address]  [address] [place], [place] [address] Office [phone]      I spent 14*** minutes examining this patient, reviewing medications, and using patient centered shared decision making involving their cardiac care.   I spent  20 minutes reviewing past medical history,  medications, and prior cardiac tests.

## 2023-12-27 ENCOUNTER — Encounter: Payer: Self-pay | Admitting: General Practice

## 2023-12-27 ENCOUNTER — Ambulatory Visit: Attending: General Practice | Admitting: General Practice

## 2023-12-27 VITALS — BP 136/64 | HR 72 | Ht <= 58 in | Wt 106.8 lb

## 2023-12-27 DIAGNOSIS — E782 Mixed hyperlipidemia: Secondary | ICD-10-CM

## 2023-12-27 DIAGNOSIS — I10 Essential (primary) hypertension: Secondary | ICD-10-CM

## 2023-12-27 DIAGNOSIS — R55 Syncope and collapse: Secondary | ICD-10-CM | POA: Diagnosis not present

## 2023-12-27 DIAGNOSIS — I480 Paroxysmal atrial fibrillation: Secondary | ICD-10-CM

## 2023-12-27 NOTE — Patient Instructions (Signed)
 Follow-Up: At [facility], you and your health needs are our priority.  As part of our continuing mission to provide you with exceptional heart care, our providers are all part of one team.  This team includes your primary Cardiologist (physician) and Advanced Practice Providers or APPs (Physician Assistants and Nurse Practitioners) who all work together to provide you with the care you need, when you need it.  Your next appointment:   6 month(s)  Provider:   Josefa Beauvais, NP or Sherry JAYSON Maxcy, MD  We recommend signing up for the patient portal called MyChart.  Sign up information is provided on this After Visit Summary.  MyChart is used to connect with patients for Virtual Visits (Telemedicine).  Patients are able to view lab/test results, encounter notes, upcoming appointments, etc.  Non-urgent messages can be sent to your provider as well.   To learn more about what you can do with MyChart, go to [email or web address].   Other Instructions Please INCREASE your water intake to 64oz daily.

## 2024-03-06 ENCOUNTER — Ambulatory Visit: Admitting: Adult Health
